# Patient Record
Sex: Female | Born: 1960 | Race: Black or African American | Hispanic: No | Marital: Married | Smoking: Never smoker
Health system: Southern US, Community
[De-identification: ages and names within clinical notes are randomized; demographics above are authoritative.]

## PROBLEM LIST (undated history)

## (undated) ENCOUNTER — Encounter

## (undated) DIAGNOSIS — M5136 Other intervertebral disc degeneration, lumbar region: Secondary | ICD-10-CM

## (undated) DIAGNOSIS — K222 Esophageal obstruction: Secondary | ICD-10-CM

## (undated) DIAGNOSIS — K5900 Constipation, unspecified: Secondary | ICD-10-CM

## (undated) DIAGNOSIS — K219 Gastro-esophageal reflux disease without esophagitis: Secondary | ICD-10-CM

## (undated) DIAGNOSIS — M797 Fibromyalgia: Secondary | ICD-10-CM

## (undated) DIAGNOSIS — I10 Essential (primary) hypertension: Secondary | ICD-10-CM

## (undated) DIAGNOSIS — E039 Hypothyroidism, unspecified: Secondary | ICD-10-CM

## (undated) DIAGNOSIS — J45909 Unspecified asthma, uncomplicated: Secondary | ICD-10-CM

## (undated) DIAGNOSIS — R569 Unspecified convulsions: Secondary | ICD-10-CM

## (undated) DIAGNOSIS — E119 Type 2 diabetes mellitus without complications: Secondary | ICD-10-CM

## (undated) DIAGNOSIS — F419 Anxiety disorder, unspecified: Secondary | ICD-10-CM

## (undated) DIAGNOSIS — D329 Benign neoplasm of meninges, unspecified: Secondary | ICD-10-CM

## (undated) DIAGNOSIS — F32A Depression, unspecified: Secondary | ICD-10-CM

## (undated) DIAGNOSIS — G43909 Migraine, unspecified, not intractable, without status migrainosus: Secondary | ICD-10-CM

## (undated) DIAGNOSIS — K589 Irritable bowel syndrome without diarrhea: Secondary | ICD-10-CM

## (undated) DIAGNOSIS — D6852 Prothrombin gene mutation: Secondary | ICD-10-CM

## (undated) HISTORY — DX: Fibromyalgia: M79.7

## (undated) HISTORY — DX: Gastro-esophageal reflux disease without esophagitis: K21.9

## (undated) HISTORY — DX: Hypothyroidism, unspecified: E03.9

## (undated) HISTORY — DX: Essential (primary) hypertension: I10

## (undated) HISTORY — DX: Other intervertebral disc degeneration, lumbar region: M51.36

## (undated) HISTORY — DX: Unspecified convulsions: R56.9

## (undated) HISTORY — DX: Unspecified asthma, uncomplicated: J45.909

## (undated) HISTORY — DX: Type 2 diabetes mellitus without complications: E11.9

## (undated) HISTORY — DX: Constipation, unspecified: K59.00

## (undated) HISTORY — DX: Esophageal obstruction: K22.2

## (undated) HISTORY — PX: COLECTOMY: SHX59

## (undated) HISTORY — PX: ROTATOR CUFF REPAIR: SHX139

## (undated) HISTORY — PX: CRANIOTOMY: SHX93

## (undated) HISTORY — PX: TONSILLECTOMY: SUR1361

## (undated) HISTORY — PX: KNEE SURGERY: SHX244

---

## 2016-09-03 ENCOUNTER — Emergency Department: Admit: 2016-09-03 | Discharge: 2016-09-03

## 2016-09-03 ENCOUNTER — Inpatient Hospital Stay: Admit: 2016-09-03 | Discharge: 2016-09-03

## 2016-09-03 DIAGNOSIS — S8392XA Sprain of unspecified site of left knee, initial encounter: Secondary | ICD-10-CM

## 2016-09-03 DIAGNOSIS — Z7951 Long term (current) use of inhaled steroids: Secondary | ICD-10-CM

## 2016-09-03 DIAGNOSIS — E7800 Pure hypercholesterolemia, unspecified: Secondary | ICD-10-CM

## 2016-09-03 DIAGNOSIS — F329 Major depressive disorder, single episode, unspecified: Secondary | ICD-10-CM

## 2016-09-03 DIAGNOSIS — M7052 Other bursitis of knee, left knee: Principal | ICD-10-CM

## 2016-09-03 DIAGNOSIS — F419 Anxiety disorder, unspecified: Secondary | ICD-10-CM

## 2016-09-03 DIAGNOSIS — M069 Rheumatoid arthritis, unspecified: Secondary | ICD-10-CM

## 2016-09-03 DIAGNOSIS — Z794 Long term (current) use of insulin: Secondary | ICD-10-CM

## 2016-09-03 DIAGNOSIS — M797 Fibromyalgia: Secondary | ICD-10-CM

## 2016-09-03 DIAGNOSIS — M25562 Pain in left knee: Secondary | ICD-10-CM

## 2016-09-03 DIAGNOSIS — Z7982 Long term (current) use of aspirin: Secondary | ICD-10-CM

## 2016-09-03 DIAGNOSIS — E039 Hypothyroidism, unspecified: Secondary | ICD-10-CM

## 2016-09-03 DIAGNOSIS — M79606 Pain in leg, unspecified: Secondary | ICD-10-CM

## 2016-09-03 DIAGNOSIS — J45909 Unspecified asthma, uncomplicated: Secondary | ICD-10-CM

## 2016-09-03 DIAGNOSIS — K219 Gastro-esophageal reflux disease without esophagitis: Secondary | ICD-10-CM

## 2016-09-03 DIAGNOSIS — E119 Type 2 diabetes mellitus without complications: Secondary | ICD-10-CM

## 2016-09-03 DIAGNOSIS — R569 Unspecified convulsions: Secondary | ICD-10-CM

## 2016-09-03 DIAGNOSIS — E785 Hyperlipidemia, unspecified: Secondary | ICD-10-CM

## 2016-09-03 DIAGNOSIS — I10 Essential (primary) hypertension: Secondary | ICD-10-CM

## 2016-09-03 DIAGNOSIS — Z79899 Other long term (current) drug therapy: Secondary | ICD-10-CM

## 2016-09-03 DIAGNOSIS — S86912A Strain of unspecified muscle(s) and tendon(s) at lower leg level, left leg, initial encounter: Secondary | ICD-10-CM

## 2016-09-03 DIAGNOSIS — G47419 Narcolepsy without cataplexy: Secondary | ICD-10-CM

## 2016-09-03 MED ORDER — IBUPROFEN 600 MG PO TABS
600 mg | Freq: Four times a day (QID) | ORAL | 0 refills | Status: CP | PRN
Start: 2016-09-03 — End: ?

## 2016-09-03 MED ORDER — IBUPROFEN 400 MG PO TABS
800 mg | Freq: Once | ORAL | Status: CP
Start: 2016-09-03 — End: ?

## 2016-09-03 NOTE — ED Notes
Report given to Lauren B. RN

## 2016-09-03 NOTE — ED Notes
IV removed with catheter intact.  Pressure applied, Time of discharge: 1037  AM., Patient discharged to  Home.  Patient discharged  ambulatory. to exit with belongings in  Stable condition.  Patient escorted by  family., Written discharge instructions given to  patient.  Patient/recipient  verbalizes discharge instructions. Pt refused to take perscription for ibuprofen.

## 2016-09-03 NOTE — ED Provider Notes
History     Chief Complaint   Patient presents with   ? Leg Pain     Left       HPI Comments: Rhonda Durham is a 56 y.o. female presenting to the ED today with c/o left knee pain. Pain is predominently to the medial aspect of the left knee and radiates into the lower leg. Denies any injury or trauma. History of surgery to the meniscus and ACL repair of the left knee. No other symptoms or complaints.     Patient is a 56 y.o. female presenting with Knee Pain. The history is provided by the patient. No language interpreter was used.   Knee Pain   Location:  Knee  Injury: no    Knee location:  L knee  Pain details:     Radiates to:  L leg    Severity:  Moderate    Onset quality:  Gradual    Timing:  Constant    Progression:  Unchanged  Chronicity:  New  Associated symptoms: no back pain, no fever, no neck pain, no numbness and no tingling        Allergies   Allergen Reactions   ? Ambien [Zolpidem] Other (See Comments)     Hallucinations     ? Cubicin [Daptomycin] Hives   ? Levaquin [Levofloxacin] Hives   ? Morphine Hives   ? Sulfa Drugs Hives   ? Tyloxapol Angioedema   ? Vancomycin Hives   ? Vicodin [Hydrocodone-Acetaminophen] Angioedema       Patient's Medications   New Prescriptions    No medications on file   Previous Medications    ALBUTEROL (PROVENTIL) 2 MG TABLET    Take 90 mg by mouth 2 times daily.    ALPRAZOLAM (XANAX) 2 MG TABLET    Take 2 mg by mouth 3 times daily as needed.     ARMODAFINIL (NUVIGIL) 250 MG TABS    Take by mouth daily.     ASPIRIN 81 MG TABLET DELAYED RELEASE    Take 81 mg by mouth daily.    AZELASTINE (ASTELIN) 0.1 % NASAL SPRAY    Spray 1 spray in each nostril 2 times daily. Use in each nostril as directed    CITALOPRAM HYDROBROMIDE (CELEXA PO)    Take 20 mg by mouth daily.     CLINDAMYCIN-BENZOYL PEROXIDE (BENZACLIN) 1-5 % GEL    Apply topically 2 times daily. Apply topically 2 times daily    CLONAZEPAM (KLONOPIN) 1 MG TABLET    Take 0.5 mg by mouth 2 times daily as needed.

## 2016-09-03 NOTE — ED Provider Notes
phasic flow. Femoral vein: No thrombus is identified in the visualized portions of the femoral vein which demonstrates normal compressibility and phasic flow. Popliteal vein: No thrombus is identified in the visualized portions of the popliteal vein which demonstrates normal compressibility and phasic flow. Superficial venous system: The visualized portions of the greater saphenous vein is patent without thrombus.     Impression: No evidence of deep venous thrombosis of left lower extremity or right common femoral vein.     Per Radiology: Xr Knee Left 1 Or 2 Views    Result Date: 09/03/2016  XR KNEE LEFT 1 OR 2 VIEWS Clinical indication:55 years Female Knee pain Comparison:  None Findings: The osseous structures are intact. No dislocations are visualized. There is joint space narrowing visualized the medial, lateral and patellofemoral compartment with osteophytosis. No soft tissue calcifications or radiopaque foreign bodies.     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 I personally reviewed the images and the residents findings and agree with the above. Read By - Nigel SloopSmita Sharma  Electronically Verified By - Nigel SloopSmita Sharma  Released Date Time - 09/03/2016 7:52 AM  Resident - Micheline RoughMarsela Hyska    EKG (Read by ED Provider):  not applicable    MDM  Number of Diagnoses or Management Options     Amount and/or Complexity of Data Reviewed  Clinical lab tests: ordered and reviewed  Tests in the radiology section of CPT?: ordered and reviewed  Discussion of test results with the performing providers: no  Decide to obtain previous medical records or to obtain history from someone other than the patient: no  Obtain history from someone other than the patient: no  Review and summarize past medical records: no  Discuss the patient with other providers: no  Independent visualization of images, tracings, or specimens: yes        ED Course & Re-Evaluation

## 2016-09-03 NOTE — ED Provider Notes
MONTELUKAST (SINGULAIR) 10 MG TABLET    Take 10 mg by mouth nightly at bedtime.    NALTREXONE-BUPROPION ER (CONTRAVE) 8-90 MG TABLET EXTENDED RELEASE 12 HOUR    Take 1 Tablet by mouth 2 times daily.    NEBIVOLOL (BYSTOLIC) 2.5 MG TABLET    Take 1 Tablet by mouth daily.    OMEPRAZOLE (PRILOSEC) 40 MG CAPSULE DELAYED RELEASE    Take 40 mg by mouth daily.    ONDANSETRON (ZOFRAN) 2 MG TABS    Take 8 mg by mouth as needed.     OXYCODONE (ROXICODONE) 30 MG IMMEDIATE RELEASE TABLET    Take 30 mg by mouth every 8 hours as needed.     POLYETHYLENE GLYCOL (MIRALAX) PACKET    Take by mouth 3 times daily.     PREGABALIN (LYRICA) 150 MG CAPSULE    Take  by mouth 2 times daily.    PRENATAL MV-MIN-FE FUM-FA-DHA (PRENATAL 1 PO)    Take by mouth daily.    PROCHLORPERAZINE (COMPAZINE) 10 MG TABLET    Take 10 mg by mouth every 8 hours as needed.    QUETIAPINE (SEROQUEL) 300 MG TABLET    Take by mouth daily.    ROSUVASTATIN (CRESTOR) 40 MG TABLET    Take 40 mg by mouth nightly at bedtime.     TIZANIDINE (ZANAFLEX) 4 MG TABLET    Take  by mouth every 6 hours as needed.    TRAMADOL (ULTRAM) 50 MG TABLET    Take 50 mg by mouth every 4 hours as needed for pain.    TRAZODONE (DESYREL) 150 MG TABLET    Take  by mouth nightly.    VALSARTAN (DIOVAN) 320 MG TABLET    Take  by mouth daily.    ZONISAMIDE (ZONEGRAN PO)    Take 250 mg by mouth 2 times daily.   Modified Medications    No medications on file   Discontinued Medications    No medications on file       Past Medical History:   Diagnosis Date   ? Anxiety    ? Asthma    ? Depression    ? Diabetes mellitus    ? Fibromyalgia    ? GERD (gastroesophageal reflux disease)    ? High cholesterol    ? Hyperlipemia    ? Hypertension    ? Hypothyroid    ? Narcolepsy    ? Rheumatoid arthritis    ? Seizures        Past Surgical History:   Procedure Laterality Date   ? APPENDECTOMY     ? BRAIN SURGERY  2006    craniotomy for menangioma resection   ? HYSTERECTOMY     ? KNEE ARTHROSCOPY Bilateral

## 2016-09-03 NOTE — ED Provider Notes
and phasic flow. Superficial venous system: The visualized portions of the greater saphenous vein is patent without thrombus.     Impression: No evidence of deep venous thrombosis of left lower extremity or right common femoral vein.     Per Radiology: Xr Knee Left 1 Or 2 Views    Result Date: 09/03/2016  XR KNEE LEFT 1 OR 2 VIEWS Clinical indication:55 years Female Knee pain Comparison:  None Findings: The osseous structures are intact. No dislocations are visualized. There is joint space narrowing visualized the medial, lateral and patellofemoral compartment with osteophytosis. No soft tissue calcifications or radiopaque foreign bodies.     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 I personally reviewed the images and the residents findings and agree with the above. Read By Nigel Sloop- Smita Sharma  Electronically Verified By - Nigel SloopSmita Sharma  Released Date Time - 09/03/2016 7:52 AM  Resident - Micheline RoughMarsela Hyska    EKG (Read by ED Provider):  not applicable    MDM  Number of Diagnoses or Management Options     Amount and/or Complexity of Data Reviewed  Clinical lab tests: ordered and reviewed  Tests in the radiology section of CPT?: ordered and reviewed  Discussion of test results with the performing providers: no  Decide to obtain previous medical records or to obtain history from someone other than the patient: no  Obtain history from someone other than the patient: no  Review and summarize past medical records: no  Discuss the patient with other providers: no  Independent visualization of images, tracings, or specimens: yes        ED Course & Re-Evaluation     Patient is a 56 y.o. female presenting to the ED today with c/o atraumatic left knee pain. History of meniscus surgery and ACL repair. Plan is for pain control, D-dimer, and XR of the knee. Will re-assess.  Claudie Fishermandom, Geoffrey Lyle, MD 7:11 AM 09/03/2016    9:10 AM  D-dimer 0.78. Will obtain US of the leg to r/o DVT.    10:10 AM

## 2016-09-03 NOTE — ED Provider Notes
US negative for DVT. XR suggestive of degenerative disease of the knee. Will discharge home with     ED Course       ED Disposition   ED Disposition: No ED Disposition Set      ED Clinical Impression   ED Clinical Impression:   No Clinical Impression Set      ED Patient Status   Patient Status:   {SH ED JX PATIENT STATUS:[phone]}        ED Medical Evaluation Initiated   Medical Evaluation Initiated:   Yes, filed at 09/03/16 0703  by Claudie Fishermandom, Geoffrey Lyle, MD             Claudie Fishermandom, Geoffrey Lyle, MD  09/03/16 [phone]      Scribe Attestation:  Donnella ShamI, Derek Koon, have acted as scribe for Claudie Fishermandom, Geoffrey Lyle, MD 7:11 AM 09/03/2016    Physician Attestation: Marland Kitchen***

## 2016-09-03 NOTE — ED Provider Notes
History reviewed. No pertinent family history.    Social History     Social History   ? Marital status: Married     Spouse name: N/A   ? Number of children: N/A   ? Years of education: N/A     Social History Main Topics   ? Smoking status: Never Smoker   ? Smokeless tobacco: Never Used   ? Alcohol use No   ? Drug use: No   ? Sexual activity: Not Asked     Other Topics Concern   ? None     Social History Narrative       Review of Systems   Constitutional: Negative for fever and chills.   HENT: Negative for sore throat and neck pain.    Eyes: Negative for discharge and redness.   Respiratory: Negative for cough and shortness of breath.    Cardiovascular: Negative for chest pain and leg swelling.   Gastrointestinal: Negative for nausea, vomiting, abdominal pain and diarrhea.   Genitourinary: Negative for dysuria and difficulty urinating.   Musculoskeletal: Positive for arthralgias. Negative for back pain.   Skin: Negative for rash.   Neurological: Negative for dizziness and headaches.   All other systems reviewed and are negative.      Physical Exam       ED Triage Vitals   BP 09/03/16 0636 138/88   Pulse 09/03/16 0636 78   Resp --    Temp 09/03/16 0636 36.8 ?C (98.3 ?F)   Temp src 09/03/16 0636 Oral   Height 09/03/16 0636 1.6 m   Weight 09/03/16 0636 74.4 kg   SpO2 09/03/16 0636 100 %   BMI (Calculated) 09/03/16 0636 29.11             Physical Exam   Constitutional: She is oriented to person, place, and time. She appears well-developed and well-nourished. No distress.   HENT:   Head: Normocephalic and atraumatic.   Nose: Nose normal.   Eyes: Conjunctivae are normal. Pupils are equal, round, and reactive to light. Right eye exhibits no discharge. Left eye exhibits no discharge.   Neck: Neck supple. No tracheal deviation present.   Pulmonary/Chest: Effort normal. No respiratory distress.   Musculoskeletal: Normal range of motion.   Left Lower Extremity: No obvious calf swelling. Some TTP of the left knee

## 2016-09-03 NOTE — ED Provider Notes
CONDITION:[phone]}  409.901 FS  641.19 FS  627.732 (16) FS    ED Workup   Procedures    Labs:  - - No data to display      Imaging (Read by ED Provider):  {Imaging findings:[phone]}    EKG (Read by ED Provider):  {EKG findings:[phone]}    MDM    ED Course & Re-Evaluation     ED Course         ED Disposition   ED Disposition: No ED Disposition Set      ED Clinical Impression   ED Clinical Impression:   No Clinical Impression Set      ED Patient Status   [facility] Status:   [phone][facility]        ED Medical Evaluation Initiated   Medical Evaluation Initiated:   Yes, filed at 09/03/16 0703  by Claudie Fishermandom, Geoffrey Lyle, MD             Claudie Fishermandom, Geoffrey Lyle, MD  09/03/16 [phone]

## 2016-09-03 NOTE — ED Provider Notes
Left Lower Extremity: No obvious calf swelling. Some TTP of the left knee at the left medial prepatellar surface. No erythema, no skin breakdown. Mild pain with ROM of the knee. Good distal perfusion.    Neurological: She is alert and oriented to person, place, and time.   Skin: Skin is warm and dry. She is not diaphoretic. No erythema.   Psychiatric: She has a normal mood and affect. Her behavior is normal.   Nursing note and vitals reviewed.      Differential DDx: arthritis, bursitis, less likely occult fracture or DVT    Is this an Emergent Medical Condition? Yes - Severe Pain/Acute Onset of Symptons  409.901 FS  641.19 FS  627.732 (16) FS    ED Workup   Procedures    Labs:  -   D-DIMER,QUANTITATIVE - Abnormal        Result Value Ref Range    D Dimer (hs) 0.78 (*) 0.00 - 0.49 ug/mL (FEU)    Comment: A cut-off for the exclusion of DVT and PE has not been established for this method         Imaging (Read by ED Provider):  Per Radiology: [place] Venous Doppler Lower Ext Left    Result Date: 09/03/2016  US VENOUS DOPPLER LOWER EXT LEFT History: 55 years Female Leg pain Technique: Real time ultrasound is used to examine the deep venous system of the left lower extremity. Gray scale with compression maneuvers, Color Doppler and Spectral Doppler at rest and with augmentation of the left common femoral, profunda femoral, femoral and popliteal veins  and right common femoral vein was performed. Images were obtained and stored in a permanent archive. Comparison: None Findings: Right lower extremity: Deep venous system: Common femoral vein: No thrombus is identified in the visualized portions of the common femoral  vein which demonstrates normal compressibility and phasic flow. Left lower extremity: Deep venous system: Common femoral vein: No thrombus is identified in the visualized portions of the common femoral  vein which demonstrates normal compressibility and

## 2016-09-03 NOTE — ED Provider Notes
Yes, filed at 09/03/16 0703  by Claudie Fishermandom, Geoffrey Lyle, MD             Claudie Fishermandom, Geoffrey Lyle, MD  09/03/16 [phone]      Scribe Attestation:  Donnella ShamI, Derek Koon, have acted as scribe for Claudie Fishermandom, Geoffrey Lyle, MD 7:11 AM 09/03/2016    Physician Attestation: Marland Kitchen***

## 2016-09-03 NOTE — ED Provider Notes
DEXLANSOPRAZOLE (DEXILANT) 60 MG CAPSULE DELAYED RELEASE    Take by mouth daily.    DICLOFENAC SODIUM (VOLTAREN) 1 % GEL    Apply 4 g topically 4 times daily.    EPINEPHRINE (EPIPEN 2-PAK, AUVI-Q, ADRENACLICK) 0.3 PZ/0.2HE SOLUTION AUTO-INJECTOR    Inject 0.3 mg into the muscle.    ERGOCALCIFEROL (VITAMIN D-2) 50000 UNITS CAPSULE    Take by mouth once a week. 2 times a week    EZETIMIBE (ZETIA) 10 MG TABLET    Take 10 mg by mouth nightly at bedtime.     FERROUS SULFATE 325 (65 FE) MG TABLET    Take 325 mg by mouth 2 times daily (with meals). Administer ferrous sulfate 2 hours prior to, or 4 hours after antacids.    FISH OIL 1000 MG CAPSULE    Take 1,000 mg by mouth daily.    FLUTICASONE-SALMETEROL (ADVAIR) 100-50 MCG/DOSE DISKUS INHALER    Inhale every 12 hours.    IBUPROFEN (ADVIL,MOTRIN) 800 MG TABLET    Take 800 mg by mouth every 8 hours as needed for pain.    INSULIN ASPART (NOVOLOG) INJECTION    Inject into the skin 3 times daily (before meals). Sliding scale      INSULIN GLARGINE (LANTUS) INJECTION    Inject 48 Units into the skin twice daily, three times a week.     IPRATROPIUM-ALBUTEROL (DUONEB) 0.5-2.5 (3) MG/3ML SOLN    Take  by nebulization.    LACOSAMIDE (VIMPAT) 50 MG TABLET TABLET    Take 150 mg by mouth 2 times daily.     LEVOCETIRIZINE (XYZAL) 5 MG TABS    Take 5 mg by mouth every evening.    LEVOTHYROXINE (SYNTHROID, LEVOTHROID) 50 MCG TABLET    Take 0.5 mcg by mouth daily.     LIDOCAINE (LIDODERM) 5 % PATCH    Place onto the skin every 24 hours. Each patch should be ON for 12 hours and OFF for 12 hours. Maximum of 3 patches in 24 hours.    LINACLOTIDE (LINZESS) 290 MCG CAPSULE    Take 290 mcg by mouth daily.    MAGNESIUM OXIDE (MAG-OX) 400 (241.3 MG) MG TABLET    Take 400 mg by mouth daily.    METHYLNALTREXONE BROMIDE (RELISTOR) 12 MG/0.6ML KIT    into the skin.    MONTELUKAST (SINGULAIR) 10 MG TABLET    Take 10 mg by mouth nightly at bedtime.

## 2016-09-03 NOTE — ED Provider Notes
Patient is a 56 y.o. female presenting to the ED today with c/o atraumatic left knee pain. History of meniscus surgery and ACL repair. Plan is for pain control, D-dimer, and XR of the knee. Will re-assess.  Claudie Fishermandom, Geoffrey Lyle, MD 7:11 AM 09/03/2016    9:10 AM  D-dimer 0.78. Will obtain US of the leg to r/o DVT.    10:17 AM  US negative for DVT. XR shows degenerative disease of the knee. Will discharge home with prescription for ibuprofen, follow up instructions given.    ED Course       ED Disposition   ED Disposition: Discharge      ED Clinical Impression   ED Clinical Impression:   Acute pain of left knee  Knee strain, left, initial encounter  Bursitis of left knee, unspecified bursa      ED Patient Status   Patient Status:   Good        ED Medical Evaluation Initiated   Medical Evaluation Initiated:   Yes, filed at 09/03/16 0703  by Claudie Fishermandom, Geoffrey Lyle, MD             Claudie Fishermandom, Geoffrey Lyle, MD  09/03/16 [phone]      Scribe Attestation:  Donnella ShamI, Derek Koon, have acted as scribe for Claudie Fishermandom, Geoffrey Lyle, MD 7:11 AM 09/03/2016    Physician Attestation: Marland Kitchen***

## 2016-09-03 NOTE — ED Provider Notes
ROSUVASTATIN (CRESTOR) 40 MG TABLET    Take 40 mg by mouth nightly at bedtime.     TIZANIDINE (ZANAFLEX) 4 MG TABLET    Take  by mouth every 6 hours as needed.    TRAMADOL (ULTRAM) 50 MG TABLET    Take 50 mg by mouth every 4 hours as needed for pain.    TRAZODONE (DESYREL) 150 MG TABLET    Take  by mouth nightly.    VALSARTAN (DIOVAN) 320 MG TABLET    Take  by mouth daily.    ZONISAMIDE (ZONEGRAN PO)    Take 250 mg by mouth 2 times daily.   Modified Medications    No medications on file   Discontinued Medications    No medications on file       Past Medical History:   Diagnosis Date   ? Anxiety    ? Asthma    ? Depression    ? Diabetes mellitus    ? Fibromyalgia    ? GERD (gastroesophageal reflux disease)    ? High cholesterol    ? Hyperlipemia    ? Hypertension    ? Hypothyroid    ? Narcolepsy    ? Rheumatoid arthritis    ? Seizures        Past Surgical History:   Procedure Laterality Date   ? APPENDECTOMY     ? BRAIN SURGERY  2006    craniotomy for menangioma resection   ? HYSTERECTOMY     ? KNEE ARTHROSCOPY Bilateral    ? TONSILLECTOMY     ? TUBAL LIGATION         History reviewed. No pertinent family history.    Social History     Social History   ? Marital status: Married     Spouse name: N/A   ? Number of children: N/A   ? Years of education: N/A     Social History Main Topics   ? Smoking status: Never Smoker   ? Smokeless tobacco: Never Used   ? Alcohol use No   ? Drug use: No   ? Sexual activity: Not Asked     Other Topics Concern   ? None     Social History Narrative       Review of Systems    Physical Exam       ED Triage Vitals   BP 09/03/16 0636 138/88   Pulse 09/03/16 0636 78   Resp --    Temp 09/03/16 0636 36.8 ?C (98.3 ?F)   Temp src 09/03/16 0636 Oral   Height 09/03/16 0636 1.6 m   Weight 09/03/16 0636 74.4 kg   SpO2 09/03/16 0636 100 %   BMI (Calculated) 09/03/16 0636 29.11             Physical Exam    Differential DDx: ***    Is this an Emergent Medical Condition? {SH ED EMERGENT MEDICAL

## 2016-09-03 NOTE — ED Provider Notes
left knee pain. History of meniscus surgery and ACL repair. Plan is for pain control, D-dimer, and XR of the knee. Will re-assess.  Claudie Fishermandom, Geoffrey Lyle, MD 7:11 AM 09/03/2016    ED Course         ED Disposition   ED Disposition: No ED Disposition Set      ED Clinical Impression   ED Clinical Impression:   No Clinical Impression Set      ED Patient Status   Patient Status:   {SH ED JX PATIENT [phone]}        ED Medical Evaluation Initiated   Medical Evaluation Initiated:   Yes, filed at 09/03/16 0703  by Claudie Fishermandom, Geoffrey Lyle, MD             Claudie Fishermandom, Geoffrey Lyle, MD  09/03/16 [phone]      Scribe Attestation:  Donnella ShamI, Derek Koon, have acted as scribe for Claudie Fishermandom, Geoffrey Lyle, MD 7:11 AM 09/03/2016    Physician Attestation: Marland Kitchen***

## 2016-09-03 NOTE — ED Provider Notes
at the left medial prepatellar surface. No erythema, no skin breakdown. Mild pain with ROM of the knee. Good distal perfusion.    Neurological: She is alert and oriented to person, place, and time.   Skin: Skin is warm and dry. She is not diaphoretic. No erythema.   Psychiatric: She has a normal mood and affect. Her behavior is normal.   Nursing note and vitals reviewed.      Differential DDx: arthritis, bursitis, less likely occult fracture or DVT    Is this an Emergent Medical Condition? Yes - Severe Pain/Acute Onset of Symptons  409.901 FS  641.19 FS  627.732 (16) FS    ED Workup   Procedures    Labs:  - - No data to display      Imaging (Read by ED Provider):  {Imaging findings:[phone]}    EKG (Read by ED Provider):  {EKG findings:([phone]}    MDM  Number of Diagnoses or Management Options     Amount and/or Complexity of Data Reviewed  Clinical lab tests: ordered and reviewed  Tests in the radiology section of CPT?: ordered and reviewed  Discussion of test results with the performing providers: no  Decide to obtain previous medical records or to obtain history from someone other than the patient: no  Obtain history from someone other than the patient: no  Review and summarize past medical records: no  Discuss the patient with other providers: no  Independent visualization of images, tracings, or specimens: yes        ED Course & Re-Evaluation     Patient is a 56 y.o. female presenting to the ED today with c/o atraumatic left knee pain. History of meniscus surgery and ACL repair. Plan is for pain control, D-dimer, and XR of the knee. Will re-assess.  Claudie Fishermandom, Geoffrey Lyle, MD 7:11 AM 09/03/2016    ED Course         ED Disposition   ED Disposition: No ED Disposition Set      ED Clinical Impression   ED Clinical Impression:   No Clinical Impression Set      ED Patient Status   Patient Status:   {SH ED JX PATIENT [phone]}        ED Medical Evaluation Initiated   Medical Evaluation Initiated:

## 2016-09-03 NOTE — ED Provider Notes
at the left medial prepatellar surface. No erythema, no skin breakdown. Mild pain with ROM of the knee. Good distal perfusion.    Neurological: She is alert and oriented to person, place, and time.   Skin: Skin is warm and dry. She is not diaphoretic. No erythema.   Psychiatric: She has a normal mood and affect. Her behavior is normal.   Nursing note and vitals reviewed.      Differential DDx: arthritis, bursitis, less likely occult fracture or DVT    Is this an Emergent Medical Condition? Yes - Severe Pain/Acute Onset of Symptons  409.901 FS  641.19 FS  627.732 (16) FS    ED Workup   Procedures    Labs:  - - No data to display      Imaging (Read by ED Provider):  Per Radiology: Xr Knee Left 1 Or 2 Views    Result Date: 09/03/2016  XR KNEE LEFT 1 OR 2 VIEWS Clinical indication:55 years Female Knee pain Comparison:  None Findings: The osseous structures are intact. No dislocations are visualized. There is joint space narrowing visualized the medial, lateral and patellofemoral compartment with osteophytosis. No soft tissue calcifications or radiopaque foreign bodies.     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     EKG (Read by ED Provider):  {EKG findings:[id number]}    MDM  Number of Diagnoses or Management Options     Amount and/or Complexity of Data Reviewed  Clinical lab tests: ordered and reviewed  Tests in the radiology section of CPT?: ordered and reviewed  Discussion of test results with the performing providers: no  Decide to obtain previous medical records or to obtain history from someone other than the patient: no  Obtain history from someone other than the patient: no  Review and summarize past medical records: no  Discuss the patient with other providers: no  Independent visualization of images, tracings, or specimens: yes        ED Course & Re-Evaluation     Patient is a 56 y.o. female presenting to the ED today with c/o atraumatic

## 2016-09-03 NOTE — ED Provider Notes
?   TONSILLECTOMY     ? TUBAL LIGATION         History reviewed. No pertinent family history.    Social History     Social History   ? Marital status: Married     Spouse name: N/A   ? Number of children: N/A   ? Years of education: N/A     Social History Main Topics   ? Smoking status: Never Smoker   ? Smokeless tobacco: Never Used   ? Alcohol use No   ? Drug use: No   ? Sexual activity: Not Asked     Other Topics Concern   ? None     Social History Narrative       Review of Systems   Constitutional: Negative for fever and chills.   HENT: Negative for sore throat and neck pain.    Eyes: Negative for discharge and redness.   Respiratory: Negative for cough and shortness of breath.    Cardiovascular: Negative for chest pain and leg swelling.   Gastrointestinal: Negative for nausea, vomiting, abdominal pain and diarrhea.   Genitourinary: Negative for dysuria and difficulty urinating.   Musculoskeletal: Positive for arthralgias. Negative for back pain.   Skin: Negative for rash.   Neurological: Negative for dizziness and headaches.   All other systems reviewed and are negative.      Physical Exam       ED Triage Vitals   BP 09/03/16 0636 138/88   Pulse 09/03/16 0636 78   Resp --    Temp 09/03/16 0636 36.8 ?C (98.3 ?F)   Temp src 09/03/16 0636 Oral   Height 09/03/16 0636 1.6 m   Weight 09/03/16 0636 74.4 kg   SpO2 09/03/16 0636 100 %   BMI (Calculated) 09/03/16 0636 29.11             Physical Exam   Constitutional: She is oriented to person, place, and time. She appears well-developed and well-nourished. No distress.   HENT:   Head: Normocephalic and atraumatic.   Nose: Nose normal.   Eyes: Conjunctivae are normal. Pupils are equal, round, and reactive to light. Right eye exhibits no discharge. Left eye exhibits no discharge.   Neck: Neck supple. No tracheal deviation present.   Pulmonary/Chest: Effort normal. No respiratory distress.   Musculoskeletal: Normal range of motion.

## 2016-09-03 NOTE — ED Notes
Unsuccessfull attempt

## 2016-09-03 NOTE — ED Provider Notes
History     Chief Complaint   Patient presents with   ? Leg Pain     Left       HPI Comments: Rhonda ShoreSandra Gusler is a 56 y.o. female presenting to the ED today with c/o left knee pain. Pain is predominently to the medial aspect of the left knee and radiates into the lower leg. Denies any injury or trauma. History of surgery to the meniscus and ACL repair of the left knee. No other symptoms or complaints.     Patient is a 56 y.o. female presenting with Knee Pain. The history is provided by the patient. No language interpreter was used.   Knee Pain   Location:  Knee  Injury: no    Knee location:  L knee  Pain details:     Radiates to:  L leg    Severity:  Moderate    Onset quality:  Gradual    Timing:  Constant    Progression:  Unchanged  Chronicity:  New  Associated symptoms: no back pain, no fever, no neck pain, no numbness and no tingling        Allergies   Allergen Reactions   ? Ambien [Zolpidem] Other (See Comments)     Hallucinations     ? Cubicin [Daptomycin] Hives   ? Levaquin [Levofloxacin] Hives   ? Morphine Hives   ? Sulfa Drugs Hives   ? Tyloxapol Angioedema   ? Vancomycin Hives   ? Vicodin [Hydrocodone-Acetaminophen] Angioedema       Patient's Medications   New Prescriptions    IBUPROFEN (ADVIL,MOTRIN) 600 MG PO TABLET    Take 1 tablet by mouth every 6 hours as needed for pain.   Previous Medications    ALBUTEROL (PROVENTIL) 2 MG TABLET    Take 90 mg by mouth 2 times daily.    ALPRAZOLAM (XANAX) 2 MG TABLET    Take 2 mg by mouth 3 times daily as needed.     ARMODAFINIL (NUVIGIL) 250 MG TABS    Take by mouth daily.     ASPIRIN 81 MG TABLET DELAYED RELEASE    Take 81 mg by mouth daily.    AZELASTINE (ASTELIN) 0.1 % NASAL SPRAY    Spray 1 spray in each nostril 2 times daily. Use in each nostril as directed    CITALOPRAM HYDROBROMIDE (CELEXA PO)    Take 20 mg by mouth daily.     CLINDAMYCIN-BENZOYL PEROXIDE (BENZACLIN) 1-5 % GEL    Apply topically 2 times daily. Apply topically 2 times daily

## 2016-09-03 NOTE — ED Provider Notes
left knee pain. History of meniscus surgery and ACL repair. Plan is for pain control, D-dimer, and XR of the knee. Will re-assess.  Claudie Fishermandom, Geoffrey Lyle, MD 7:11 AM 09/03/2016    9:10 AM  D-dimer 0.78. Will obtain US of the leg to r/o DVT.    ***  ED Course         ED Disposition   ED Disposition: No ED Disposition Set      ED Clinical Impression   ED Clinical Impression:   No Clinical Impression Set      ED Patient Status   Patient Status:   {SH ED JX PATIENT STATUS:[id number]}        ED Medical Evaluation Initiated   Medical Evaluation Initiated:   Yes, filed at 09/03/16 0703  by Claudie Fishermandom, Geoffrey Lyle, MD             Claudie Fishermandom, Geoffrey Lyle, MD  09/03/16 [phone]      Scribe Attestation:  Donnella ShamI, Derek Koon, have acted as scribe for Claudie Fishermandom, Geoffrey Lyle, MD 7:11 AM 09/03/2016    Physician Attestation: Marland Kitchen***

## 2016-09-03 NOTE — ED Provider Notes
Patient is a 56 y.o. female presenting to the ED today with c/o atraumatic left knee pain. History of meniscus surgery and ACL repair. Plan is for pain control, D-dimer, and XR of the knee. Will re-assess.  Claudie Fishermandom, Geoffrey Lyle, MD 7:11 AM 09/03/2016    9:10 AM  D-dimer 0.78. Will obtain US of the leg to r/o DVT.    10:17 AM  US negative for DVT. XR shows degenerative disease of the knee. Will discharge home with prescription for ibuprofen, follow up instructions given.    ED Course       ED Disposition   ED Disposition: Discharge      ED Clinical Impression   ED Clinical Impression:   Acute pain of left knee  Knee strain, left, initial encounter  Bursitis of left knee, unspecified bursa      ED Patient Status   Patient Status:   Good        ED Medical Evaluation Initiated   Medical Evaluation Initiated:   Yes, filed at 09/03/16 0703  by Claudie Fishermandom, Geoffrey Lyle, MD             Claudie Fishermandom, Geoffrey Lyle, MD  09/03/16 [phone]      Scribe Attestation:  Donnella ShamI, Derek Koon, have acted as scribe for Claudie Fishermandom, Geoffrey Lyle, MD 7:11 AM 09/03/2016    Physician Attestation: I have reviewed and confirmed the information stated by the scribe and made corrections and edits as appropriate.  I have personally provided the services documented by the scribe.      Janith LimaGeoffrey L Odom, MD         Claudie Fishermandom, Geoffrey Lyle, MD  09/03/16 [phone]

## 2016-09-03 NOTE — ED Provider Notes
IBUPROFEN (ADVIL,MOTRIN) 800 MG TABLET    Take 800 mg by mouth every 8 hours as needed for pain.    INSULIN ASPART (NOVOLOG) INJECTION    Inject into the skin 3 times daily (before meals). Sliding scale      INSULIN GLARGINE (LANTUS) INJECTION    Inject 48 Units into the skin twice daily, three times a week.     IPRATROPIUM-ALBUTEROL (DUONEB) 0.5-2.5 (3) MG/3ML SOLN    Take  by nebulization.    LACOSAMIDE (VIMPAT) 50 MG TABLET TABLET    Take 150 mg by mouth 2 times daily.     LEVOCETIRIZINE (XYZAL) 5 MG TABS    Take 5 mg by mouth every evening.    LEVOTHYROXINE (SYNTHROID, LEVOTHROID) 50 MCG TABLET    Take 0.5 mcg by mouth daily.     LIDOCAINE (LIDODERM) 5 % PATCH    Place onto the skin every 24 hours. Each patch should be ON for 12 hours and OFF for 12 hours. Maximum of 3 patches in 24 hours.    LINACLOTIDE (LINZESS) 290 MCG CAPSULE    Take 290 mcg by mouth daily.    MAGNESIUM OXIDE (MAG-OX) 400 (241.3 MG) MG TABLET    Take 400 mg by mouth daily.    METHYLNALTREXONE BROMIDE (RELISTOR) 12 MG/0.6ML KIT    into the skin.    MONTELUKAST (SINGULAIR) 10 MG TABLET    Take 10 mg by mouth nightly at bedtime.    NALTREXONE-BUPROPION ER (CONTRAVE) 8-90 MG TABLET EXTENDED RELEASE 12 HOUR    Take 1 Tablet by mouth 2 times daily.    NEBIVOLOL (BYSTOLIC) 2.5 MG TABLET    Take 1 Tablet by mouth daily.    OMEPRAZOLE (PRILOSEC) 40 MG CAPSULE DELAYED RELEASE    Take 40 mg by mouth daily.    ONDANSETRON (ZOFRAN) 2 MG TABS    Take 8 mg by mouth as needed.     OXYCODONE (ROXICODONE) 30 MG IMMEDIATE RELEASE TABLET    Take 30 mg by mouth every 8 hours as needed.     POLYETHYLENE GLYCOL (MIRALAX) PACKET    Take by mouth 3 times daily.     PREGABALIN (LYRICA) 150 MG CAPSULE    Take  by mouth 2 times daily.    PRENATAL MV-MIN-FE FUM-FA-DHA (PRENATAL 1 PO)    Take by mouth daily.    PROCHLORPERAZINE (COMPAZINE) 10 MG TABLET    Take 10 mg by mouth every 8 hours as needed.    QUETIAPINE (SEROQUEL) 300 MG TABLET    Take by mouth daily.

## 2016-09-03 NOTE — ED Provider Notes
at the left medial prepatellar surface. No erythema, no skin breakdown. Mild pain with ROM of the knee. Good distal perfusion.    Neurological: She is alert and oriented to person, place, and time.   Skin: Skin is warm and dry. She is not diaphoretic. No erythema.   Psychiatric: She has a normal mood and affect. Her behavior is normal.   Nursing note and vitals reviewed.      Differential DDx: arthritis, bursitis, less likely occult fracture or DVT    Is this an Emergent Medical Condition? Yes - Severe Pain/Acute Onset of Symptons  409.901 FS  641.19 FS  627.732 (16) FS    ED Workup   Procedures    Labs:  - - No data to display      Imaging (Read by ED Provider):  Per Radiology: [place] Venous Doppler Lower Ext Left    Result Date: 09/03/2016  US VENOUS DOPPLER LOWER EXT LEFT History: 55 years Female Leg pain Technique: Real time ultrasound is used to examine the deep venous system of the left lower extremity. Gray scale with compression maneuvers, Color Doppler and Spectral Doppler at rest and with augmentation of the left common femoral, profunda femoral, femoral and popliteal veins  and right common femoral vein was performed. Images were obtained and stored in a permanent archive. Comparison: None Findings: Right lower extremity: Deep venous system: Common femoral vein: No thrombus is identified in the visualized portions of the common femoral  vein which demonstrates normal compressibility and phasic flow. Left lower extremity: Deep venous system: Common femoral vein: No thrombus is identified in the visualized portions of the common femoral  vein which demonstrates normal compressibility and phasic flow. Femoral vein: No thrombus is identified in the visualized portions of the femoral vein which demonstrates normal compressibility and phasic flow. Popliteal vein: No thrombus is identified in the visualized portions of the popliteal vein which demonstrates normal compressibility

## 2016-09-03 NOTE — ED Provider Notes
History     Chief Complaint   Patient presents with   ? Leg Pain     Left       HPI    Allergies   Allergen Reactions   ? Ambien [Zolpidem] Other (See Comments)     Hallucinations     ? Cubicin [Daptomycin] Hives   ? Levaquin [Levofloxacin] Hives   ? Morphine Hives   ? Sulfa Drugs Hives   ? Tyloxapol Angioedema   ? Vancomycin Hives   ? Vicodin [Hydrocodone-Acetaminophen] Angioedema       Patient's Medications   New Prescriptions    No medications on file   Previous Medications    ALBUTEROL (PROVENTIL) 2 MG TABLET    Take 90 mg by mouth 2 times daily.    ALPRAZOLAM (XANAX) 2 MG TABLET    Take 2 mg by mouth 3 times daily as needed.     ARMODAFINIL (NUVIGIL) 250 MG TABS    Take by mouth daily.     ASPIRIN 81 MG TABLET DELAYED RELEASE    Take 81 mg by mouth daily.    AZELASTINE (ASTELIN) 0.1 % NASAL SPRAY    Spray 1 spray in each nostril 2 times daily. Use in each nostril as directed    CITALOPRAM HYDROBROMIDE (CELEXA PO)    Take 20 mg by mouth daily.     CLINDAMYCIN-BENZOYL PEROXIDE (BENZACLIN) 1-5 % GEL    Apply topically 2 times daily. Apply topically 2 times daily    CLONAZEPAM (KLONOPIN) 1 MG TABLET    Take 0.5 mg by mouth 2 times daily as needed.     DEXLANSOPRAZOLE (DEXILANT) 60 MG CAPSULE DELAYED RELEASE    Take by mouth daily.    DICLOFENAC SODIUM (VOLTAREN) 1 % GEL    Apply 4 g topically 4 times daily.    EPINEPHRINE (EPIPEN 2-PAK, AUVI-Q, ADRENACLICK) 0.3 MG/0.3ML SOLUTION AUTO-INJECTOR    Inject 0.3 mg into the muscle.    ERGOCALCIFEROL (VITAMIN D-2) 50000 UNITS CAPSULE    Take by mouth once a week. 2 times a week    EZETIMIBE (ZETIA) 10 MG TABLET    Take 10 mg by mouth nightly at bedtime.     FERROUS SULFATE 325 (65 FE) MG TABLET    Take 325 mg by mouth 2 times daily (with meals). Administer ferrous sulfate 2 hours prior to, or 4 hours after antacids.    FISH OIL 1000 MG CAPSULE    Take 1,000 mg by mouth daily.    FLUTICASONE-SALMETEROL (ADVAIR) 100-50 MCG/DOSE DISKUS INHALER    Inhale every 12 hours.

## 2016-09-03 NOTE — ED Triage Notes
Patient arrived to ED, with chief complaint of left leg pain. Patient states pain started 4 days aod with not trauma/injury. Patient has ortho history to left knee. PAtietn states pain starts above knee and wraps around to the calf area. Vitals stable, breathing unlabored. Call bell within reach.

## 2016-09-03 NOTE — ED Provider Notes
CLONAZEPAM (KLONOPIN) 1 MG TABLET    Take 0.5 mg by mouth 2 times daily as needed.     DEXLANSOPRAZOLE (DEXILANT) 60 MG CAPSULE DELAYED RELEASE    Take by mouth daily.    DICLOFENAC SODIUM (VOLTAREN) 1 % GEL    Apply 4 g topically 4 times daily.    EPINEPHRINE (EPIPEN 2-PAK, AUVI-Q, ADRENACLICK) 0.3 ZO/1.0RU SOLUTION AUTO-INJECTOR    Inject 0.3 mg into the muscle.    ERGOCALCIFEROL (VITAMIN D-2) 50000 UNITS CAPSULE    Take by mouth once a week. 2 times a week    EZETIMIBE (ZETIA) 10 MG TABLET    Take 10 mg by mouth nightly at bedtime.     FERROUS SULFATE 325 (65 FE) MG TABLET    Take 325 mg by mouth 2 times daily (with meals). Administer ferrous sulfate 2 hours prior to, or 4 hours after antacids.    FISH OIL 1000 MG CAPSULE    Take 1,000 mg by mouth daily.    FLUTICASONE-SALMETEROL (ADVAIR) 100-50 MCG/DOSE DISKUS INHALER    Inhale every 12 hours.    IBUPROFEN (ADVIL,MOTRIN) 800 MG TABLET    Take 800 mg by mouth every 8 hours as needed for pain.    INSULIN ASPART (NOVOLOG) INJECTION    Inject into the skin 3 times daily (before meals). Sliding scale      INSULIN GLARGINE (LANTUS) INJECTION    Inject 48 Units into the skin twice daily, three times a week.     IPRATROPIUM-ALBUTEROL (DUONEB) 0.5-2.5 (3) MG/3ML SOLN    Take  by nebulization.    LACOSAMIDE (VIMPAT) 50 MG TABLET TABLET    Take 150 mg by mouth 2 times daily.     LEVOCETIRIZINE (XYZAL) 5 MG TABS    Take 5 mg by mouth every evening.    LEVOTHYROXINE (SYNTHROID, LEVOTHROID) 50 MCG TABLET    Take 0.5 mcg by mouth daily.     LIDOCAINE (LIDODERM) 5 % PATCH    Place onto the skin every 24 hours. Each patch should be ON for 12 hours and OFF for 12 hours. Maximum of 3 patches in 24 hours.    LINACLOTIDE (LINZESS) 290 MCG CAPSULE    Take 290 mcg by mouth daily.    MAGNESIUM OXIDE (MAG-OX) 400 (241.3 MG) MG TABLET    Take 400 mg by mouth daily.    METHYLNALTREXONE BROMIDE (RELISTOR) 12 MG/0.6ML KIT    into the skin.

## 2016-09-03 NOTE — ED Provider Notes
NALTREXONE-BUPROPION ER (CONTRAVE) 8-90 MG TABLET EXTENDED RELEASE 12 HOUR    Take 1 Tablet by mouth 2 times daily.    NEBIVOLOL (BYSTOLIC) 2.5 MG TABLET    Take 1 Tablet by mouth daily.    OMEPRAZOLE (PRILOSEC) 40 MG CAPSULE DELAYED RELEASE    Take 40 mg by mouth daily.    ONDANSETRON (ZOFRAN) 2 MG TABS    Take 8 mg by mouth as needed.     OXYCODONE (ROXICODONE) 30 MG IMMEDIATE RELEASE TABLET    Take 30 mg by mouth every 8 hours as needed.     POLYETHYLENE GLYCOL (MIRALAX) PACKET    Take by mouth 3 times daily.     PREGABALIN (LYRICA) 150 MG CAPSULE    Take  by mouth 2 times daily.    PRENATAL MV-MIN-FE FUM-FA-DHA (PRENATAL 1 PO)    Take by mouth daily.    PROCHLORPERAZINE (COMPAZINE) 10 MG TABLET    Take 10 mg by mouth every 8 hours as needed.    QUETIAPINE (SEROQUEL) 300 MG TABLET    Take by mouth daily.    ROSUVASTATIN (CRESTOR) 40 MG TABLET    Take 40 mg by mouth nightly at bedtime.     TIZANIDINE (ZANAFLEX) 4 MG TABLET    Take  by mouth every 6 hours as needed.    TRAMADOL (ULTRAM) 50 MG TABLET    Take 50 mg by mouth every 4 hours as needed for pain.    TRAZODONE (DESYREL) 150 MG TABLET    Take  by mouth nightly.    VALSARTAN (DIOVAN) 320 MG TABLET    Take  by mouth daily.    ZONISAMIDE (ZONEGRAN PO)    Take 250 mg by mouth 2 times daily.   Modified Medications    No medications on file   Discontinued Medications    No medications on file       Past Medical History:   Diagnosis Date   ? Anxiety    ? Asthma    ? Depression    ? Diabetes mellitus    ? Fibromyalgia    ? GERD (gastroesophageal reflux disease)    ? High cholesterol    ? Hyperlipemia    ? Hypertension    ? Hypothyroid    ? Narcolepsy    ? Rheumatoid arthritis    ? Seizures        Past Surgical History:   Procedure Laterality Date   ? APPENDECTOMY     ? BRAIN SURGERY  2006    craniotomy for menangioma resection   ? HYSTERECTOMY     ? KNEE ARTHROSCOPY Bilateral    ? TONSILLECTOMY     ? TUBAL LIGATION

## 2016-09-26 ENCOUNTER — Emergency Department (HOSPITAL_COMMUNITY): Payer: Medicare Other

## 2016-09-26 ENCOUNTER — Encounter (HOSPITAL_COMMUNITY): Payer: Self-pay | Admitting: *Deleted

## 2016-09-26 ENCOUNTER — Emergency Department (HOSPITAL_COMMUNITY)
Admission: EM | Admit: 2016-09-26 | Discharge: 2016-09-26 | Disposition: A | Discharge status: Home / Self Care | Payer: Medicare Other | Attending: Emergency Medicine | Admitting: Emergency Medicine

## 2016-09-26 DIAGNOSIS — R251 Tremor, unspecified: Secondary | ICD-10-CM | POA: Insufficient documentation

## 2016-09-26 DIAGNOSIS — Z79899 Other long term (current) drug therapy: Secondary | ICD-10-CM | POA: Insufficient documentation

## 2016-09-26 DIAGNOSIS — R519 Headache, unspecified: Secondary | ICD-10-CM

## 2016-09-26 DIAGNOSIS — R51 Headache: Secondary | ICD-10-CM

## 2016-09-26 LAB — COMPREHENSIVE METABOLIC PANEL
ALBUMIN: 4.2 g/dL (ref 3.5–5.0)
ALT: 13 U/L — AB (ref 14–54)
AST: 22 U/L (ref 15–41)
Alkaline Phosphatase: 62 U/L (ref 38–126)
Anion gap: 9 (ref 5–15)
BUN: 10 mg/dL (ref 6–20)
CHLORIDE: 102 mmol/L (ref 101–111)
CO2: 26 mmol/L (ref 22–32)
CREATININE: 0.75 mg/dL (ref 0.44–1.00)
Calcium: 9.6 mg/dL (ref 8.9–10.3)
GFR calc non Af Amer: >60 mL/min (ref >60)
Glucose, Bld: 101 mg/dL — ABNORMAL HIGH (ref 65–99)
Potassium: 3.7 mmol/L (ref 3.5–5.1)
Sodium: 137 mmol/L (ref 135–145)
Total Bilirubin: 0.5 mg/dL (ref 0.3–1.2)
Total Protein: 7.3 g/dL (ref 6.5–8.1)

## 2016-09-26 LAB — TSH: TSH: 1.107 u[IU]/mL (ref 0.350–4.500)

## 2016-09-26 LAB — CBC WITH DIFFERENTIAL/PLATELET
BASOS PCT: 1 %
Basophils Absolute: 0 10*3/uL (ref 0.0–0.1)
EOS ABS: 0 10*3/uL (ref 0.0–0.7)
EOS PCT: 1 %
HCT: 38.6 % (ref 36.0–46.0)
Hemoglobin: 13.3 g/dL (ref 12.0–15.0)
Lymphocytes Relative: 36 %
Lymphs Abs: 1.9 10*3/uL (ref 0.7–4.0)
MCH: 31.3 pg (ref 26.0–34.0)
MCHC: 34.5 g/dL (ref 30.0–36.0)
MCV: 90.8 fL (ref 78.0–100.0)
Monocytes Absolute: 0.5 10*3/uL (ref 0.1–1.0)
Monocytes Relative: 9 %
Neutro Abs: 2.9 10*3/uL (ref 1.7–7.7)
Neutrophils Relative %: 53 %
PLATELETS: 307 10*3/uL (ref 150–400)
RBC: 4.25 MIL/uL (ref 3.87–5.11)
RDW: 13.5 % (ref 11.5–15.5)
WBC: 5.4 10*3/uL (ref 4.0–10.5)

## 2016-09-26 LAB — RAPID URINE DRUG SCREEN, HOSP PERFORMED
AMPHETAMINES: POSITIVE — AB
BENZODIAZEPINES: POSITIVE — AB
Barbiturates: NOT DETECTED
Cocaine: NOT DETECTED
Opiates: NOT DETECTED
Tetrahydrocannabinol: NOT DETECTED

## 2016-09-26 LAB — ETHANOL

## 2016-09-26 LAB — URINALYSIS, ROUTINE W REFLEX MICROSCOPIC
Bilirubin Urine: NEGATIVE
Glucose, UA: NEGATIVE mg/dL
HGB URINE DIPSTICK: NEGATIVE
Ketones, ur: NEGATIVE mg/dL
Leukocytes, UA: NEGATIVE
Nitrite: NEGATIVE
Protein, ur: NEGATIVE mg/dL
Specific Gravity, Urine: 1.011 (ref 1.005–1.030)
pH: 6 (ref 5.0–8.0)

## 2016-09-26 LAB — CBG MONITORING, ED: GLUCOSE-CAPILLARY: 94 mg/dL (ref 65–99)

## 2016-09-26 MED ORDER — BUTALBITAL-APAP-CAFFEINE 50-325-40 MG PO TABS: 1.0000 | ORAL_TABLET | Freq: Four times a day (QID) | ORAL | 0 refills | Status: AC | PRN

## 2016-09-26 MED ORDER — GADOBENATE DIMEGLUMINE 529 MG/ML IV SOLN
15.0000 mL | Freq: Once | INTRAVENOUS | Status: AC | PRN
  Administered 2016-09-26: 15 mL via INTRAVENOUS

## 2016-09-26 MED ORDER — BUTALBITAL-APAP-CAFFEINE 50-325-40 MG PO TABS
2.0000 | ORAL_TABLET | Freq: Once | ORAL | Status: AC
  Administered 2016-09-26: 2 via ORAL
  Filled 2016-09-26: qty 2

## 2016-09-26 NOTE — Discharge Instructions (Signed)
No abnormalities are found on your MRI today.  Consulting neurologist felt it would be appropriate for you to follow up with your neurologist upon returning home.  Fioricet as needed for headaches

## 2016-09-26 NOTE — Consult Note (Signed)
Reason for Consult: Headaches, tremors and memory issues. Referring Physician:  ED  Shelley Kim is an 56 y.o. female.  HPI: Patient with previous history of meningioma resected proximally 5-6 years ago. She reports at that time she used to have headaches which led to the diagnosis of meningioma. After resection she did develop some seizures reporting as petit mall and grand mal seizures. Last episode was approximately 5 years ago and she currently takes Vimpat 150 twice a day and Zonegran 250 twice a day. She also has had multiple TIAs which manifested themselves as speech disturbance. She currently takes aspirin 81 mg daily. She reports for the past 6 weeks she started to notice similar headache that she had 6 years ago. Headaches are localized in frontal region describing as throbbing sensation at a scale of 4-9/10. She reports some nausea with headache but no vomiting. No photophobia or phonophobia. No neck stiffness associated with a headache. She reports some changes in her vision with a headache. Describes changes as seeing blurry. Also has had blood pressure ranging from 140s to 180s at home. She is on 3 blood pressure medications. She also reports anxiety for which she takes as needed Xanax and also on multiple antidepressants. She had been seeing neurology in the past but has not seen in the recent months. She reports this tremor also appeared few weeks ago mostly affecting her right side. There is no exacerbating factors to the headache. She reports a headache gets worse she does take Fioricet and Motrin as needed. On average she had been taking approximately 2 Motrin and 3 Fioricets daily.  Past medical history TIAs Meningioma status post resection Diabetes Hypertension Anxiety disorder Seizure disorder  Past surgical history Meningioma resection 6 years ago  No family history on file.  Noncontributory  Social History: Does not smoke, drink alcohol, or abuse drugs. 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 Visiting from [place] and returning next weekend.  Allergies:  Allergies  Allergen Reactions  . Percocet [Oxycodone-Acetaminophen] Anaphylaxis    Tylox-capsules  . Ambien [Zolpidem] Other (See Comments)    hallucinations  . Cubicin [Daptomycin] Hives  . Levaquin [Levofloxacin] Hives  . Morphine And Related Hives  . Sulfa Antibiotics Hives  . Vancomycin Hives  . Adhesive [Tape] Rash   Medications: I have reviewed the patient's current medications.  Results for orders placed or performed during the hospital encounter of 09/26/16 (from the past 48 hour(s))  Ethanol     Status: None   Collection Time: 09/26/16  7:25 AM  Result Value Ref Range   Alcohol, Ethyl (B) <5 <5 mg/dL    Comment:        LOWEST DETECTABLE LIMIT FOR SERUM ALCOHOL IS 5 mg/dL FOR MEDICAL PURPOSES ONLY   Urinalysis, Routine w reflex microscopic     Status: Abnormal   Collection Time: 09/26/16  7:29 AM  Result Value Ref Range   Color, Urine YELLOW YELLOW   APPearance HAZY (A) CLEAR   Specific Gravity, Urine 1.011 1.005 - 1.030   pH 6.0 5.0 - 8.0   Glucose, UA NEGATIVE NEGATIVE mg/dL   Hgb urine dipstick NEGATIVE NEGATIVE   Bilirubin Urine NEGATIVE NEGATIVE   Ketones, ur NEGATIVE NEGATIVE mg/dL   Protein, ur NEGATIVE NEGATIVE mg/dL   Nitrite NEGATIVE NEGATIVE   Leukocytes, UA NEGATIVE NEGATIVE  Rapid urine drug screen (hospital performed)     Status: Abnormal   Collection Time: 09/26/16  7:29 AM  Result Value  Ref Range   Opiates NONE DETECTED NONE DETECTED   Cocaine NONE DETECTED NONE DETECTED   Benzodiazepines POSITIVE (A) NONE DETECTED   Amphetamines POSITIVE (A) NONE DETECTED   Tetrahydrocannabinol NONE DETECTED NONE DETECTED   Barbiturates NONE DETECTED NONE DETECTED    Comment:        DRUG SCREEN FOR MEDICAL PURPOSES ONLY.  IF CONFIRMATION IS NEEDED FOR ANY PURPOSE, NOTIFY LAB WITHIN 5 DAYS.        LOWEST DETECTABLE LIMITS FOR URINE DRUG  SCREEN Drug Class       Cutoff (ng/mL) Amphetamine      1000 Barbiturate      200 Benzodiazepine   628 Tricyclics       366 Opiates          300 Cocaine          300 THC              50   CBG monitoring, ED     Status: None   Collection Time: 09/26/16  7:42 AM  Result Value Ref Range   Glucose-Capillary 94 65 - 99 mg/dL   Comment 1 Notify RN    Comment 2 Document in Chart   CBC with Differential/Platelet     Status: None   Collection Time: 09/26/16  7:46 AM  Result Value Ref Range   WBC 5.4 4.0 - 10.5 K/uL   RBC 4.25 3.87 - 5.11 MIL/uL   Hemoglobin 13.3 12.0 - 15.0 g/dL   HCT 38.6 36.0 - 46.0 %   MCV 90.8 78.0 - 100.0 fL   MCH 31.3 26.0 - 34.0 pg   MCHC 34.5 30.0 - 36.0 g/dL   RDW 13.5 11.5 - 15.5 %   Platelets 307 150 - 400 K/uL   Neutrophils Relative % 53 %   Neutro Abs 2.9 1.7 - 7.7 K/uL   Lymphocytes Relative 36 %   Lymphs Abs 1.9 0.7 - 4.0 K/uL   Monocytes Relative 9 %   Monocytes Absolute 0.5 0.1 - 1.0 K/uL   Eosinophils Relative 1 %   Eosinophils Absolute 0.0 0.0 - 0.7 K/uL   Basophils Relative 1 %   Basophils Absolute 0.0 0.0 - 0.1 K/uL  Comprehensive metabolic panel     Status: Abnormal   Collection Time: 09/26/16  7:46 AM  Result Value Ref Range   Sodium 137 135 - 145 mmol/L   Potassium 3.7 3.5 - 5.1 mmol/L   Chloride 102 101 - 111 mmol/L   CO2 26 22 - 32 mmol/L   Glucose, Bld 101 (H) 65 - 99 mg/dL   BUN 10 6 - 20 mg/dL   Creatinine, Ser 0.75 0.44 - 1.00 mg/dL   Calcium 9.6 8.9 - 10.3 mg/dL   Total Protein 7.3 6.5 - 8.1 g/dL   Albumin 4.2 3.5 - 5.0 g/dL   AST 22 15 - 41 U/L   ALT 13 (L) 14 - 54 U/L   Alkaline Phosphatase 62 38 - 126 U/L   Total Bilirubin 0.5 0.3 - 1.2 mg/dL   GFR calc non Af Amer >60 >60 mL/min   GFR calc Af Amer >60 >60 mL/min    Comment: (NOTE) The eGFR has been calculated using the CKD EPI equation. This calculation has not been validated in all clinical situations. eGFR's persistently <60 mL/min signify possible Chronic  Kidney Disease.    Anion gap 9 5 - 15  TSH     Status: None   Collection Time: 09/26/16  7:46  AM  Result Value Ref Range   TSH 1.107 0.350 - 4.500 uIU/mL    Comment: Performed by a 3rd Generation assay with a functional sensitivity of <=0.01 uIU/mL.   Ct Head Wo Contrast  Result Date: 09/26/2016 CLINICAL DATA:  Right upper extremity weakness and tremors. History of meningioma resection in 2009. EXAM: CT HEAD WITHOUT CONTRAST TECHNIQUE: Contiguous axial images were obtained from the base of the skull through the vertex without intravenous contrast. COMPARISON:  None. FINDINGS: Brain: There is no evidence of acute cortical infarct, intracranial hemorrhage, mass, midline shift, or extra-axial fluid collection. The ventricles and sulci are normal. Vascular: No hyperdense vessel or unexpected calcification. Skull: Prior right frontal craniotomy. Sinuses/Orbits: No acute finding. Other: None. IMPRESSION: No evidence of acute intracranial abnormality. Electronically Signed   By: Logan Bores M.D.   On: 09/26/2016 08:07   Review of Systems  Constitutional: Negative.   HENT: Negative.   Eyes: Positive for blurred vision.  Respiratory: Negative.   Cardiovascular: Negative.   Gastrointestinal: Negative.   Genitourinary: Negative.   Musculoskeletal: Negative.   Skin: Negative.   Neurological: Negative.   Endo/Heme/Allergies: Negative.   Psychiatric/Behavioral: Positive for memory loss. The patient is nervous/anxious.    Blood pressure 143/83, pulse 79, temperature 97.8 F (36.6 C), temperature source Oral, resp. rate 18, height 5' 3"  (1.6 m), weight 70.3 kg (155 lb), SpO2 99 %. Physical Exam HEENT-  Normocephalic, no lesions, Neck supple and no rigidity was appreciated. Cardiovascular - regular rate and rhythm, S1, S2 normal, no murmur, click, rub or gallop Lungs - chest clear, no wheezing, rales, normal symmetric air entry, Heart exam - S1, S2 normal, no murmur, no gallop, rate regular Abdomen  - soft nontender and nondistended  Neurologic Examination: Mental status: Awake alert and oriented to all spheres. Speech and language: No evidence of dysarthria was appreciated. Comprehension intact. Naming intact. Fluent. Reading intact. Cranial nerves: Pupils approximately 3 mm and reactive to light. Fundus exam showed no evidence of papilledema bilaterally. Extra muscles intact. Facial sensation symmetric. No facial droop is appreciated. Hearing intact. Uvula midline. Tongue midline. Motor: 5/5 throughout Sensory: Normal sensation to light touch Coordination: Normal finger to nose and heel to show bilaterally no evidence of dysdiadochokinesia. Gait: Deferred  There is high-frequency low amplitude tremor appears mostly with action in bilateral upper and lower extremities right more than left. This tremor seems to disappear with distraction as well as at rest.  Assessment/Plan: 56 year old female with significant past medical history presented with recurrent headaches, tremor and memory issues. Most of the symptoms are chronic based on history. Unclear etiology of tremors at this time and clinical examination is somewhat concerning for possible functional disorder although this would be a diagnosis of exclusion. At this time there are no red flags in history with regards to headaches. However I have offered patient an MRI of the brain with and without contrast to look for any evidence of recurrent meningioma. If MRI is negative patient can follow-up with neurologist on an outpatient basis. I also believe her headache is related to overuse of analgesic medications. Therefore I have recommended her to cut down the use of Fioricet and ibuprofen. Tremors could also be worsened or unmasked by the use of Adderall.  Shelley Kim 09/26/2016, 11:25 AM

## 2016-09-26 NOTE — ED Notes (Signed)
Patient transported to MRI 

## 2016-09-26 NOTE — ED Notes (Signed)
Neurology at bedside.

## 2016-09-26 NOTE — ED Notes (Signed)
Pt is in peds with granddaughter

## 2016-09-26 NOTE — ED Provider Notes (Signed)
[facility] DEPT Provider Note   CSN: [id number] Arrival date & time: 09/26/16  [id number]     History   Chief Complaint Chief Complaint  Patient presents with  . Headache  . Tremors    HPI Shelley Kim is a 56 y.o. female. Chief complaint is headache and tremor  HPI:  56 year old female who lives in [place]. Visiting her granddaughter in [place]. Flew here. Did not drive.  She reports a headache for the last 3-4 days that is new for her. Has had headache in the past but not for many years. Also developed tremors of her bilateral upper extremities, right greater than left.  She reports past history of hypertension on 3 medications and type 2 diabetes now insulin-dependent. Also reports a craniotomy in 2009 for excision of a frontal fell seen meningioma. He was excised because she had become symptomatic with difficulties with memory and left hemiparesis.  She did well postoperatively and had decrease her headaches. Had ultimately good control of what were found to be seizures later. Has had grand mal, and petit mal seizures. Her last was 6 years ago.  Patient also reports some difficulty with memory over the last few days such as forgetting where she was going or what she was going to say.  Her last CT scan was 6 months ago in [place] and she reports it as "normal".  She states that her doctors in [place] over 6 months ago that she had had "2 TIAs".   She is on Zonegran, and Vimpat for seizures. Takes Xanax occasionally in when necessary for anxiety and had a dose yesterday. Denies alcohol and drugs.  Takes Fioricet rarely and when necessary for headaches. Has been taking it regularly for the last 2 days.  History reviewed. No pertinent past medical history.  There are no active problems to display for this patient.   No past surgical history on file.  OB History    No data available       Home Medications    Prior to Admission medications   Medication Sig Start Date End  Date Taking? Authorizing Provider  albuterol (PROVENTIL) (2.5 MG/3ML) 0.083% nebulizer solution Take 2.5 mg by nebulization every 6 (six) hours as needed for shortness of breath or wheezing. 09/20/16  Yes Historical Provider, MD  alprazolam Duanne Moron) 2 MG tablet Take 2 mg by mouth every 8 (eight) hours as needed for anxiety. 09/20/16  Yes Historical Provider, MD  amphetamine-dextroamphetamine (ADDERALL) 20 MG tablet Take 20 mg by mouth 2 (two) times daily. 09/20/16  Yes Historical Provider, MD  carvedilol (COREG) 6.25 MG tablet Take 6.25 mg by mouth 2 (two) times daily. 07/07/16  Yes Historical Provider, MD  citalopram (CELEXA) 40 MG tablet Take 40 mg by mouth daily. 07/07/16  Yes Historical Provider, MD  clobetasol ointment (TEMOVATE) [id number] % Apply 1 application topically daily as needed. 09/20/16  Yes Historical Provider, MD  cloNIDine (CATAPRES) 0.2 MG tablet Take 0.2 mg by mouth daily. 07/07/16  Yes Historical Provider, MD  cyanocobalamin (,VITAMIN B-12,) 1000 MCG/ML injection Inject 1 mL into the muscle every 14 (fourteen) days. 09/20/16  Yes Historical Provider, MD  DEXILANT 60 MG capsule Take 60 mg by mouth daily. 09/20/16  Yes Historical Provider, MD  diclofenac (VOLTAREN) 75 MG EC tablet Take 75 mg by mouth 2 (two) times daily as needed for pain. 09/20/16  Yes Historical Provider, MD  DIOVAN 320 MG tablet Take 320 mg by mouth daily. 09/20/16  Yes Historical Provider, MD  gabapentin (NEURONTIN) 100 MG capsule Take 100 mg by mouth 3 (three) times daily as needed for pain. 09/20/16  Yes Historical Provider, MD  HM CLEARLAX powder Take 17 g by mouth daily. 09/20/16  Yes Historical Provider, MD  hydrALAZINE (APRESOLINE) 50 MG tablet Take 50 mg by mouth every 8 (eight) hours. 09/20/16  Yes Historical Provider, MD  HYDROQUINONE TIME RELEASE 4 % CREA Apply 1 application topically daily. 09/20/16  Yes Historical Provider, MD  Lacosamide (VIMPAT) 150 MG TABS Take 150 mg by mouth 2 (two) times daily.   Yes Historical  Provider, MD  LANTUS SOLOSTAR 100 UNIT/ML Solostar Pen Inject 10-48 Units into the skin 2 (two) times daily. Inject 10 units in the morning, and 48 in the evening 08/11/16  Yes Historical Provider, MD  NOVOLOG FLEXPEN 100 UNIT/ML FlexPen Inject 6-10 Units into the skin 3 (three) times daily. Sliding scale 09/20/16  Yes Historical Provider, MD  ondansetron (ZOFRAN-ODT) 8 MG disintegrating tablet Take 8 mg by mouth every 8 (eight) hours as needed for nausea/vomiting. 08/16/16  Yes Historical Provider, MD  oxyCODONE (ROXICODONE) 15 MG immediate release tablet Take 15 mg by mouth every 6 (six) hours as needed for pain. 09/20/16  Yes Historical Provider, MD  PERI-COLACE 8.6-50 MG tablet Take 1 tablet by mouth daily. 07/07/16  Yes Historical Provider, MD  Prenatal Vit-Fe Fumarate-FA (PRENATAL VITAMIN PLUS LOW IRON) 27-1 MG TABS Take 1 tablet by mouth daily. 09/20/16  Yes Historical Provider, MD  PROAIR HFA 108 (90 Base) MCG/ACT inhaler Take 2 puffs by mouth every 6 (six) hours as needed for shortness of breath or wheezing. 08/11/16  Yes Historical Provider, MD  promethazine-codeine (PHENERGAN WITH CODEINE) 6.25-10 MG/5ML syrup Take 5 mLs by mouth every 6 (six) hours as needed for cough. 09/20/16  Yes Historical Provider, MD  REPATHA SURECLICK [id number] MG/ML SOAJ Inject 140 mg into the skin every 14 (fourteen) days. 09/20/16  Yes Historical Provider, MD  RETIN-A 0.05 % cream Apply 1 application topically daily. 09/20/16  Yes Historical Provider, MD  SEROQUEL 100 MG tablet Take 100 mg by mouth daily. 07/07/16  Yes Historical Provider, MD  SEROQUEL XR 400 MG 24 hr tablet Take 400 mg by mouth at bedtime. 07/07/16  Yes Historical Provider, MD  spironolactone (ALDACTONE) 50 MG tablet Take 50 mg by mouth 2 (two) times daily. 09/20/16  Yes Historical Provider, MD  terconazole (TERAZOL 3) 0.8 % vaginal cream Place 1 applicator vaginally every 7 (seven) days. 09/20/16  Yes Historical Provider, MD  traZODone (DESYREL) 100 MG  tablet Take 150 mg by mouth at bedtime as needed for sleep. 07/07/16  Yes Historical Provider, MD  triamcinolone (KENALOG) 0.025 % cream Apply 1 application topically daily. 09/20/16  Yes Historical Provider, MD  Vitamin D, Ergocalciferol, (DRISDOL) 50000 units CAPS capsule Take 50,000 Units by mouth 2 (two) times a week. 09/20/16  Yes Historical Provider, MD  zonisamide (ZONEGRAN) 100 MG capsule Take 200 mg by mouth 2 (two) times daily. Take with 50mg  capsule to equal 250mg    Yes Historical Provider, MD  zonisamide (ZONEGRAN) 50 MG capsule Take 50 mg by mouth daily. Take with 200mg  capsule to equal 250mg    Yes Historical Provider, MD  butalbital-acetaminophen-caffeine (FIORICET, ESGIC) [phone] MG tablet Take 1-2 tablets by mouth every 6 (six) hours as needed for headache. 09/26/16 09/26/17  Tanna Furry, MD    Family History No family history on file.  Social History Social History  Substance Use Topics  . Smoking status: Not on  file  . Smokeless tobacco: Not on file  . Alcohol use Not on file     Allergies   Percocet [oxycodone-acetaminophen]; Ambien [zolpidem]; Cubicin [daptomycin]; Levaquin [levofloxacin]; Morphine and related; Sulfa antibiotics; Vancomycin; and Adhesive [tape]   Review of Systems Review of Systems  Constitutional: Negative for appetite change, chills, diaphoresis, fatigue and fever.  HENT: Negative for mouth sores, sore throat and trouble swallowing.   Eyes: Negative for visual disturbance.  Respiratory: Negative for cough, chest tightness, shortness of breath and wheezing.   Cardiovascular: Negative for chest pain.  Gastrointestinal: Negative for abdominal distention, abdominal pain, diarrhea, nausea and vomiting.  Endocrine: Negative for polydipsia, polyphagia and polyuria.  Genitourinary: Negative for dysuria, frequency and hematuria.  Musculoskeletal: Negative for gait problem.  Skin: Negative for color change, pallor and rash.  Neurological: Positive for  tremors and headaches. Negative for dizziness, syncope and light-headedness.  Hematological: Does not bruise/bleed easily.  Psychiatric/Behavioral: Negative for behavioral problems and confusion.     Physical Exam Updated Vital Signs BP 128/93   Pulse 100   Temp 97.8 F (36.6 C) (Oral)   Resp 19   Ht 5\' 3"  (1.6 m)   Wt 155 lb (70.3 kg)   SpO2 100%   BMI 27.46 kg/m   Physical Exam  Constitutional: She is oriented to person, place, and time. She appears well-developed and well-nourished. No distress.  HENT:  Head: Normocephalic.  Eyes: Conjunctivae are normal. Pupils are equal, round, and reactive to light. No scleral icterus.  Neck: Normal range of motion. Neck supple. No thyromegaly present.  Cardiovascular: Normal rate and regular rhythm.  Exam reveals no gallop and no friction rub.   No murmur heard. Pulmonary/Chest: Effort normal and breath sounds normal. No respiratory distress. She has no wheezes. She has no rales.  Abdominal: Soft. Bowel sounds are normal. She exhibits no distension. There is no tenderness. There is no rebound.  Musculoskeletal: Normal range of motion.  Neurological: She is alert and oriented to person, place, and time.  Bilateral upper extremity tremors right greater than left. The tremors do seem to go away with intention.  Skin: Skin is warm and dry. No rash noted.  Psychiatric: She has a normal mood and affect. Her behavior is normal.     ED Treatments / Results  Labs (all labs ordered are listed, but only abnormal results are displayed) Labs Reviewed  COMPREHENSIVE METABOLIC PANEL - Abnormal; Notable for the following:       Result Value   Glucose, Bld 101 (*)    ALT 13 (*)    All other components within normal limits  URINALYSIS, ROUTINE W REFLEX MICROSCOPIC - Abnormal; Notable for the following:    APPearance HAZY (*)    All other components within normal limits  RAPID URINE DRUG SCREEN, HOSP PERFORMED - Abnormal; Notable for the  following:    Benzodiazepines POSITIVE (*)    Amphetamines POSITIVE (*)    All other components within normal limits  CBC WITH DIFFERENTIAL/PLATELET  TSH  ETHANOL  CBG MONITORING, ED    EKG  EKG Interpretation None       Radiology No results found.  Procedures Procedures (including critical care time)  Medications Ordered in ED Medications  butalbital-acetaminophen-caffeine (FIORICET, ESGIC) 50-325-40 MG per tablet 2 tablet (2 tablets Oral Given 09/26/16 0929)  gadobenate dimeglumine (MULTIHANCE) injection 15 mL (15 mLs Intravenous Contrast Given 09/26/16 1540)     Initial Impression / Assessment and Plan / ED Course  I have reviewed  the triage vital signs and the nursing notes.  Pertinent labs & imaging results that were available during my care of the patient were reviewed by me and considered in my medical decision making (see chart for details).     Asymmetric neurological exam with right upper cavity tremors greater than left. No pronator drift. Stable gait and negative Romberg. Symmetric cranial nerves with the exception of slight decrease right lower facial sensation.  Unlikely for a meningioma to recur especially in 6 months after "normal" CT scan to the size of causing symptoms. Her symptoms are similar to her original symptoms with her primary diagnosis. Different cyanosis includes tumor recurrence, TIA. Doubt which are drug or medication related.  Final Clinical Impressions(s) / ED Diagnoses   Final diagnoses:  Acute nonintractable headache, unspecified headache type    New Prescriptions Discharge Medication List as of 09/26/2016  3:29 PM       Tanna Furry, MD 09/30/16 2307

## 2016-09-26 NOTE — ED Notes (Signed)
Pt's CBG 94.  Informed Loletta Specter, Therapist, sports.

## 2016-09-26 NOTE — ED Triage Notes (Signed)
Pt having headaches and tremors, beginning approx 3 days ago. Hx of crainiotomy about 78yrs prior. Pt in town from [place]. Also reports swelling to hands for about same time.

## 2016-09-26 NOTE — ED Notes (Signed)
Patient transported to CT 

## 2018-07-21 ENCOUNTER — Inpatient Hospital Stay: Admit: 2018-07-21 | Discharge: 2018-07-21

## 2018-07-21 DIAGNOSIS — I10 Essential (primary) hypertension: Secondary | ICD-10-CM

## 2018-07-21 DIAGNOSIS — F419 Anxiety disorder, unspecified: Secondary | ICD-10-CM

## 2018-07-21 DIAGNOSIS — E7800 Pure hypercholesterolemia, unspecified: Secondary | ICD-10-CM

## 2018-07-21 DIAGNOSIS — M069 Rheumatoid arthritis, unspecified: Secondary | ICD-10-CM

## 2018-07-21 DIAGNOSIS — F329 Major depressive disorder, single episode, unspecified: Secondary | ICD-10-CM

## 2018-07-21 DIAGNOSIS — Z794 Long term (current) use of insulin: Secondary | ICD-10-CM

## 2018-07-21 DIAGNOSIS — Z7951 Long term (current) use of inhaled steroids: Secondary | ICD-10-CM

## 2018-07-21 DIAGNOSIS — R1084 Generalized abdominal pain: Principal | ICD-10-CM

## 2018-07-21 DIAGNOSIS — E119 Type 2 diabetes mellitus without complications: Secondary | ICD-10-CM

## 2018-07-21 DIAGNOSIS — K219 Gastro-esophageal reflux disease without esophagitis: Secondary | ICD-10-CM

## 2018-07-21 DIAGNOSIS — R197 Diarrhea, unspecified: Secondary | ICD-10-CM

## 2018-07-21 DIAGNOSIS — Z79899 Other long term (current) drug therapy: Secondary | ICD-10-CM

## 2018-07-21 DIAGNOSIS — J45909 Unspecified asthma, uncomplicated: Principal | ICD-10-CM

## 2018-07-21 DIAGNOSIS — E039 Hypothyroidism, unspecified: Secondary | ICD-10-CM

## 2018-07-21 DIAGNOSIS — Z7982 Long term (current) use of aspirin: Secondary | ICD-10-CM

## 2018-07-21 DIAGNOSIS — M797 Fibromyalgia: Secondary | ICD-10-CM

## 2018-07-21 DIAGNOSIS — R569 Unspecified convulsions: Secondary | ICD-10-CM

## 2018-07-21 DIAGNOSIS — G47419 Narcolepsy without cataplexy: Secondary | ICD-10-CM

## 2018-07-21 DIAGNOSIS — E785 Hyperlipidemia, unspecified: Secondary | ICD-10-CM

## 2018-07-21 DIAGNOSIS — K529 Noninfective gastroenteritis and colitis, unspecified: Secondary | ICD-10-CM

## 2018-07-21 DIAGNOSIS — R112 Nausea with vomiting, unspecified: Secondary | ICD-10-CM

## 2018-07-21 MED ORDER — ONDANSETRON HCL 4 MG/2ML IJ SOLN
4 mg | Freq: Once | INTRAVENOUS | Status: CP
Start: 2018-07-21 — End: ?

## 2018-07-21 MED ORDER — HYDRALAZINE HCL 10 MG PO TABS
10 mg | Freq: Every day | ORAL
Start: 2018-07-21 — End: ?

## 2018-07-21 MED ORDER — DICYCLOMINE HCL 10 MG PO CAPS
20 mg | Freq: Once | ORAL | Status: CP
Start: 2018-07-21 — End: ?

## 2018-07-21 MED ORDER — AMPHETAMINE-DEXTROAMPHETAMINE 20 MG PO TABS
20 mg | Freq: Two times a day (BID) | ORAL
Start: 2018-07-21 — End: ?

## 2018-07-21 MED ORDER — ALIROCUMAB 75 MG/ML SC SOSY
75 mg | SUBCUTANEOUS
Start: 2018-07-21 — End: ?

## 2018-07-21 MED ORDER — DICYCLOMINE HCL 20 MG PO TABS
20 mg | Freq: Three times a day (TID) | ORAL | 0 refills | Status: CP | PRN
Start: 2018-07-21 — End: ?

## 2018-07-21 MED ORDER — BOLUS IV FLUID JX
Freq: Once | INTRAVENOUS | Status: CP
Start: 2018-07-21 — End: ?

## 2018-07-21 MED ORDER — PROMETHAZINE HCL 25 MG RE SUPP
25 mg | Freq: Four times a day (QID) | RECTAL | 0 refills | Status: CP | PRN
Start: 2018-07-21 — End: ?

## 2018-07-21 MED ORDER — OXYCODONE HCL 15 MG PO TABS
15 mg | Freq: Four times a day (QID) | ORAL
Start: 2018-07-21 — End: ?

## 2018-07-21 MED ORDER — ONDANSETRON 4 MG PO TBDP
4 mg | Freq: Once | ORAL | 0 refills | Status: CP
Start: 2018-07-21 — End: ?

## 2018-07-21 MED ORDER — AMLODIPINE BESYLATE 10 MG PO TABS
10 mg | Freq: Every day | ORAL
Start: 2018-07-21 — End: ?

## 2018-07-22 IMAGING — CT CT HEAD W/O CM
3 of 4 series · 18 of 47 positions shown, 21 images · non-contrast
Comparison: None.

CLINICAL DATA: Right upper extremity weakness and tremors. History
of meningioma resection in 3552.

EXAM:
CT HEAD WITHOUT CONTRAST
TECHNIQUE: Contiguous axial images were obtained from the base of the skull
through the vertex without intravenous contrast.

[Series 201: head w/o, idose (1) · axial · non-contrast · 0.43mm/px · z∈[+127,+247]mm · 12 of 30 slices shown, 15 images]
[im 3/30  brain]
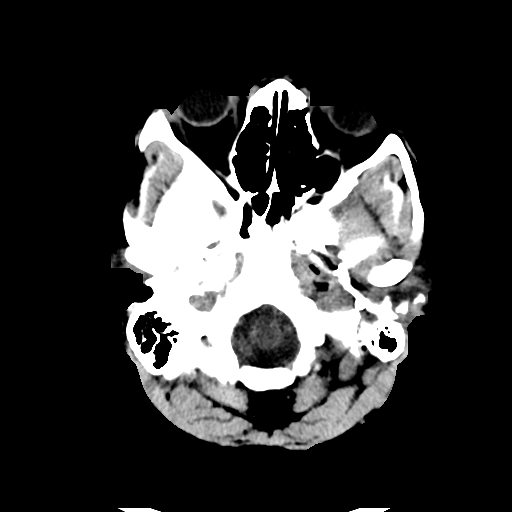
[im 3/30  bone]
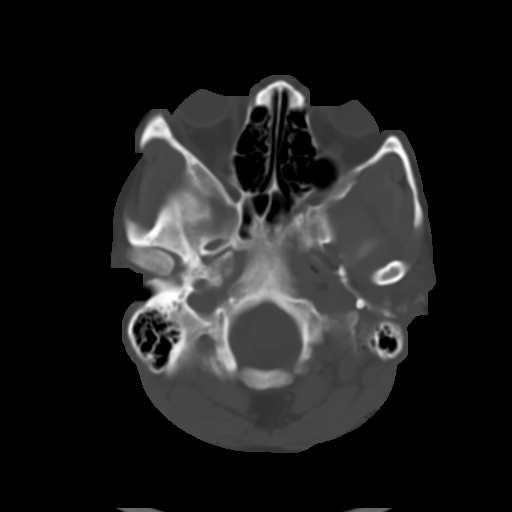
[im 5/30  brain]
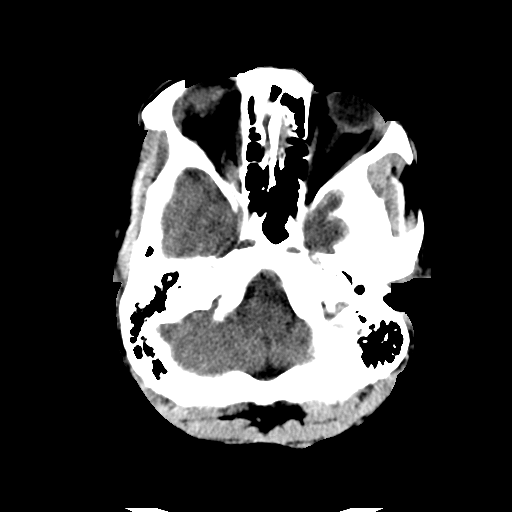
[im 7/30  brain]
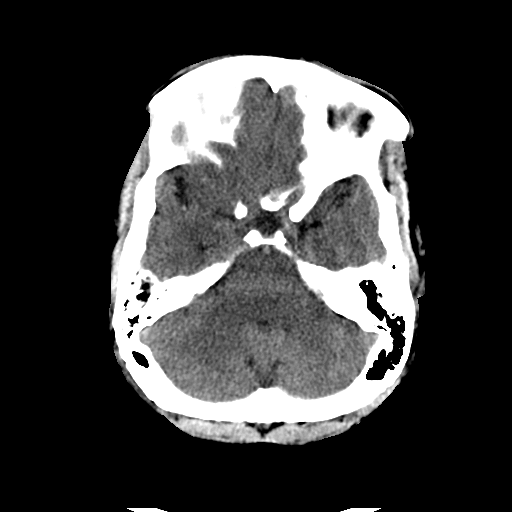
[im 9/30  brain]
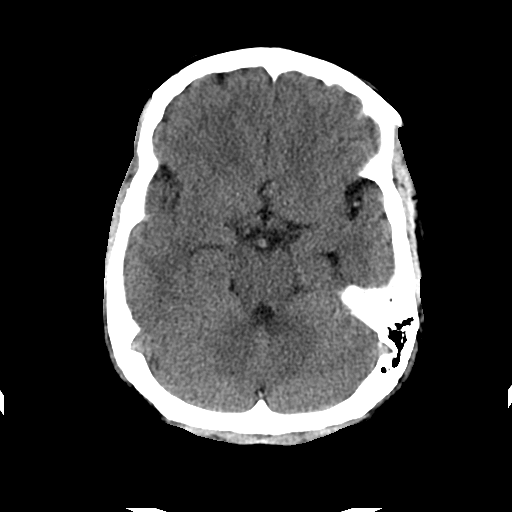
[im 11/30  brain]
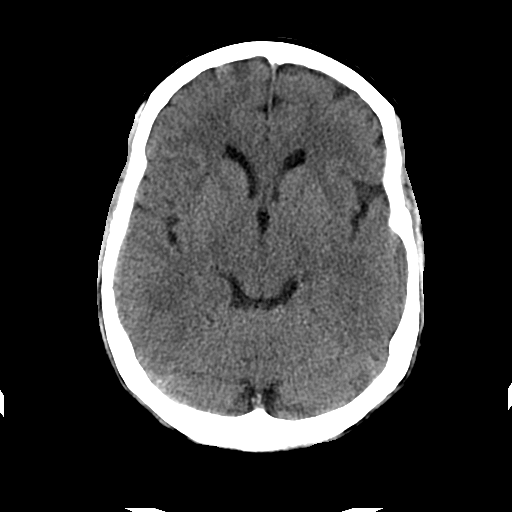
[im 11/30  bone]
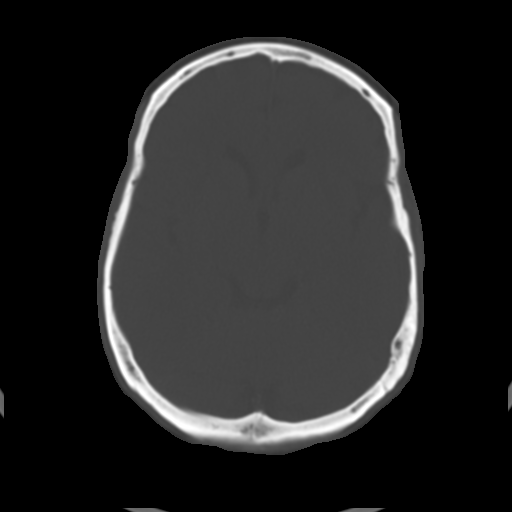
[im 13/30  brain]
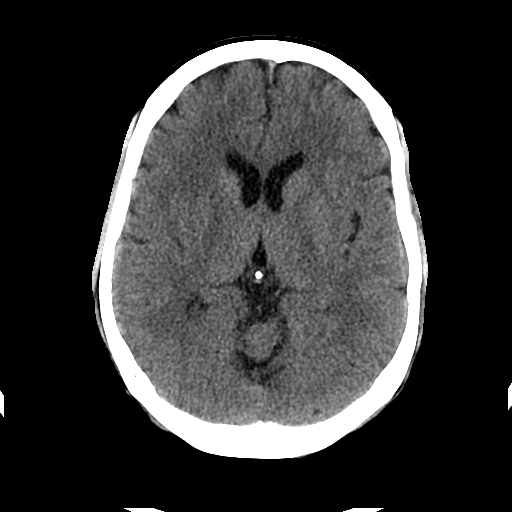
[im 17/30  brain]
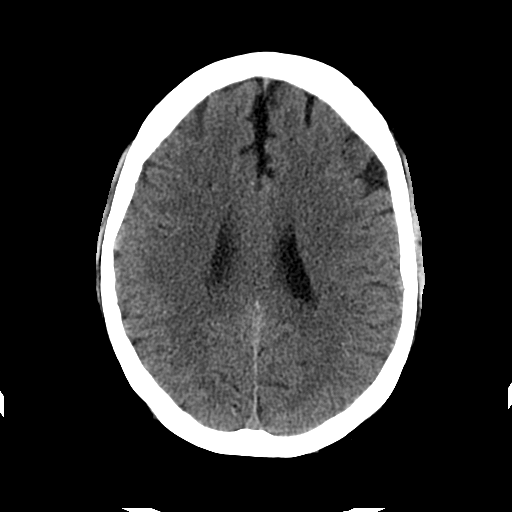
[im 19/30  brain]
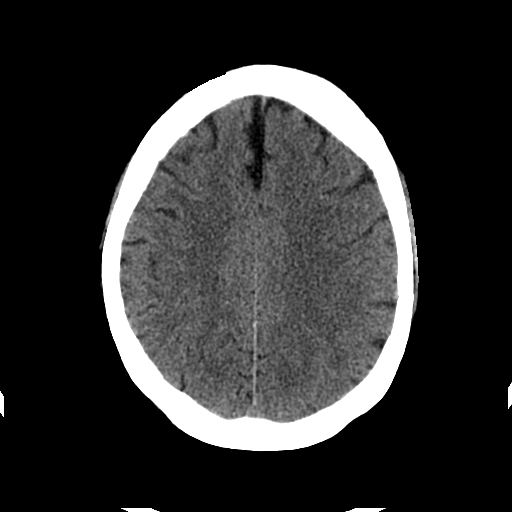
[im 21/30  brain]
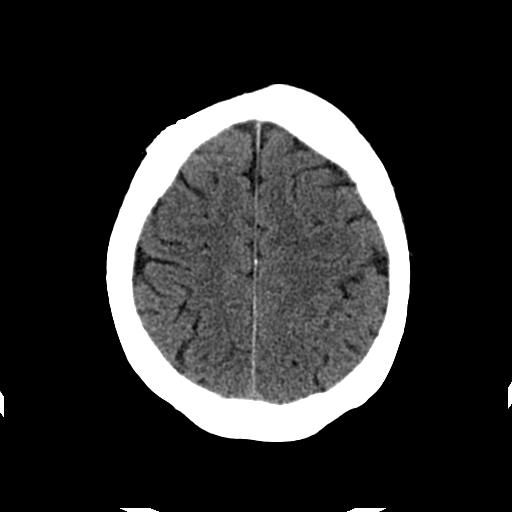
[im 21/30  bone]
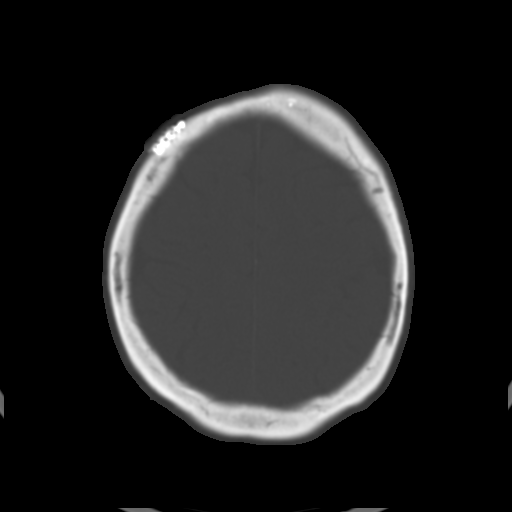
[im 23/30  brain]
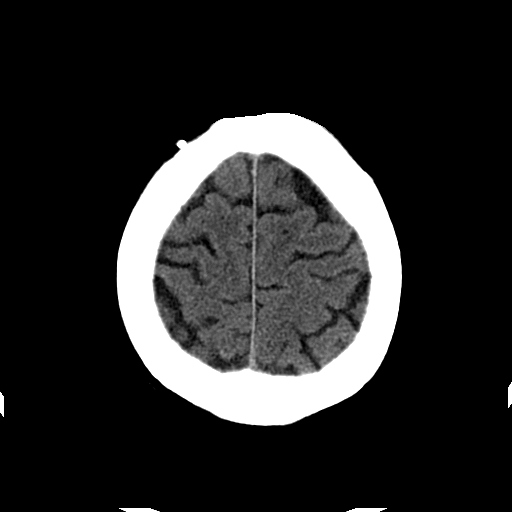
[im 25/30  brain]
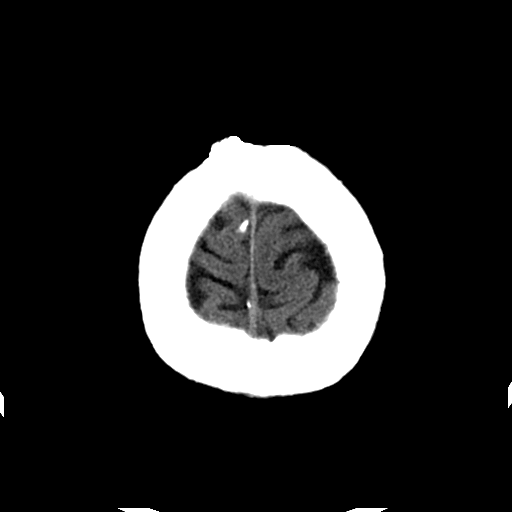
[im 27/30  brain]
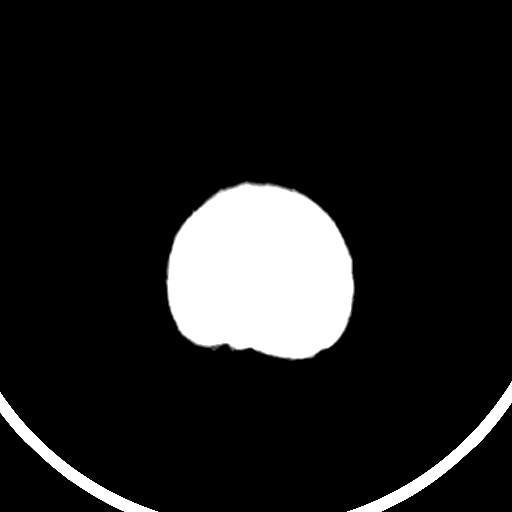

[Series 203: coronal st, idose (1) · coronal · 0.40mm/px · 3 of 70 slices shown]
[im 24/70  brain]
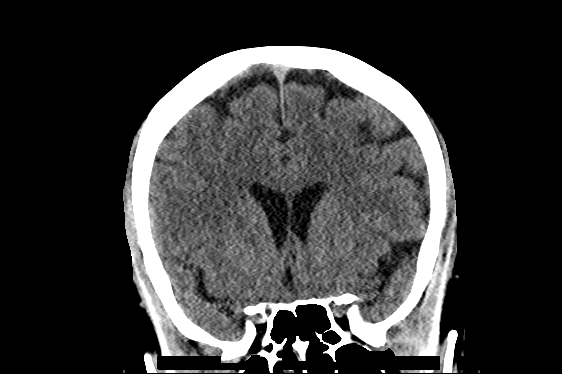
[im 31/70  brain]
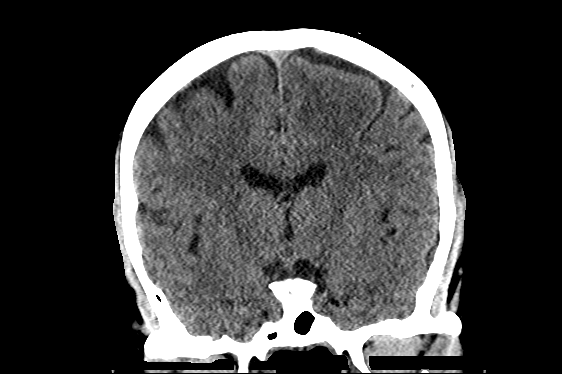
[im 39/70  brain]
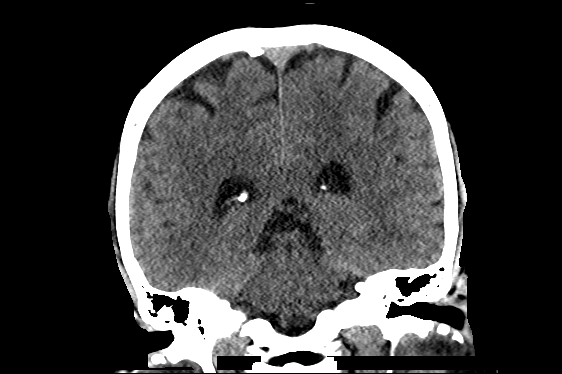

[Series 204: sagittal st, idose (1) · sagittal · 0.40mm/px · 3 of 75 slices shown]
[im 25/75  brain]
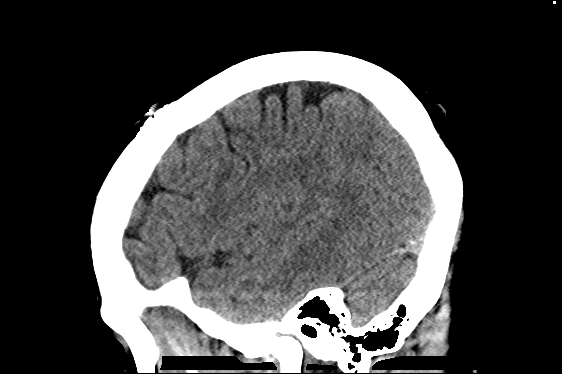
[im 38/75  brain]
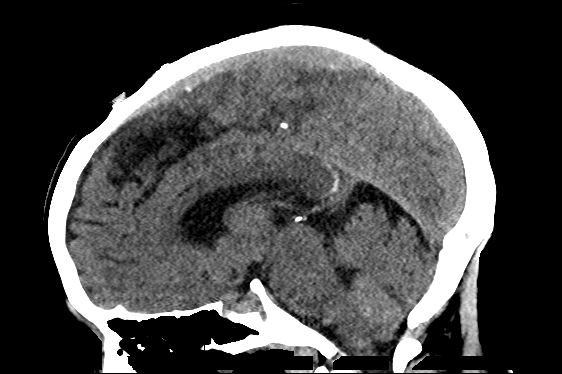
[im 50/75  brain]
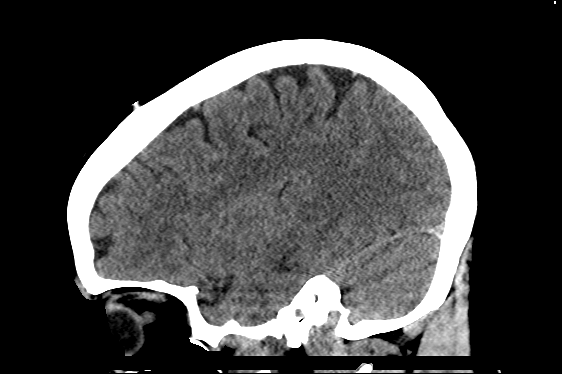

[18 of 47 positions shown; findings below may reference images not displayed]

FINDINGS: Brain: There is no evidence of acute cortical infarct, intracranial
hemorrhage, mass, midline shift, or extra-axial fluid collection.
The ventricles and sulci are normal.

Vascular: No hyperdense vessel or unexpected calcification.

Skull: Prior right frontal craniotomy.

Sinuses/Orbits: No acute finding.

Other: None.
IMPRESSION: No evidence of acute intracranial abnormality.

## 2019-11-06 ENCOUNTER — Encounter: Payer: Self-pay | Admitting: Physical Medicine & Rehabilitation

## 2019-11-06 ENCOUNTER — Inpatient Hospital Stay: Payer: Medicare Other

## 2019-11-06 ENCOUNTER — Inpatient Hospital Stay
Admission: RE | Admit: 2019-11-06 | Discharge: 2019-11-16 | DRG: 098 | Disposition: A | Discharge status: Home / Self Care | Payer: Medicare Other | Source: Other Acute Inpatient Hospital | Attending: Physical Medicine & Rehabilitation | Admitting: Physical Medicine & Rehabilitation

## 2019-11-06 DIAGNOSIS — M797 Fibromyalgia: Secondary | ICD-10-CM | POA: Diagnosis present

## 2019-11-06 DIAGNOSIS — E7800 Pure hypercholesterolemia, unspecified: Secondary | ICD-10-CM | POA: Diagnosis present

## 2019-11-06 DIAGNOSIS — Z79890 Hormone replacement therapy (postmenopausal): Secondary | ICD-10-CM

## 2019-11-06 DIAGNOSIS — R8281 Pyuria: Secondary | ICD-10-CM | POA: Diagnosis not present

## 2019-11-06 DIAGNOSIS — R630 Anorexia: Secondary | ICD-10-CM | POA: Diagnosis present

## 2019-11-06 DIAGNOSIS — E039 Hypothyroidism, unspecified: Secondary | ICD-10-CM | POA: Diagnosis present

## 2019-11-06 DIAGNOSIS — Z79899 Other long term (current) drug therapy: Secondary | ICD-10-CM

## 2019-11-06 DIAGNOSIS — I10 Essential (primary) hypertension: Secondary | ICD-10-CM | POA: Diagnosis present

## 2019-11-06 DIAGNOSIS — K580 Irritable bowel syndrome with diarrhea: Secondary | ICD-10-CM | POA: Diagnosis present

## 2019-11-06 DIAGNOSIS — G0481 Other encephalitis and encephalomyelitis: Principal | ICD-10-CM | POA: Diagnosis present

## 2019-11-06 DIAGNOSIS — M5116 Intervertebral disc disorders with radiculopathy, lumbar region: Secondary | ICD-10-CM | POA: Diagnosis present

## 2019-11-06 DIAGNOSIS — K219 Gastro-esophageal reflux disease without esophagitis: Secondary | ICD-10-CM | POA: Diagnosis present

## 2019-11-06 DIAGNOSIS — G378 Other specified demyelinating diseases of central nervous system: Secondary | ICD-10-CM

## 2019-11-06 DIAGNOSIS — F419 Anxiety disorder, unspecified: Secondary | ICD-10-CM | POA: Diagnosis present

## 2019-11-06 DIAGNOSIS — D649 Anemia, unspecified: Secondary | ICD-10-CM | POA: Diagnosis present

## 2019-11-06 DIAGNOSIS — J45909 Unspecified asthma, uncomplicated: Secondary | ICD-10-CM | POA: Diagnosis present

## 2019-11-06 DIAGNOSIS — G40909 Epilepsy, unspecified, not intractable, without status epilepticus: Secondary | ICD-10-CM | POA: Diagnosis present

## 2019-11-06 DIAGNOSIS — Z9049 Acquired absence of other specified parts of digestive tract: Secondary | ICD-10-CM

## 2019-11-06 DIAGNOSIS — E785 Hyperlipidemia, unspecified: Secondary | ICD-10-CM | POA: Diagnosis present

## 2019-11-06 DIAGNOSIS — G8220 Paraplegia, unspecified: Secondary | ICD-10-CM | POA: Diagnosis present

## 2019-11-06 DIAGNOSIS — Z87440 Personal history of urinary (tract) infections: Secondary | ICD-10-CM

## 2019-11-06 DIAGNOSIS — K5900 Constipation, unspecified: Secondary | ICD-10-CM | POA: Diagnosis not present

## 2019-11-06 DIAGNOSIS — Z86011 Personal history of benign neoplasm of the brain: Secondary | ICD-10-CM

## 2019-11-06 DIAGNOSIS — E119 Type 2 diabetes mellitus without complications: Secondary | ICD-10-CM | POA: Diagnosis present

## 2019-11-06 LAB — GLUCOSE WHOLE BLOOD - POCT
Whole Blood Glucose POCT: 95 mg/dL (ref 70–100)
Whole Blood Glucose POCT: 117 mg/dL — ABNORMAL HIGH (ref 70–100)

## 2019-11-06 MED ORDER — ZONISAMIDE 100 MG PO CAPS
200.00 mg | ORAL_CAPSULE | Freq: Two times a day (BID) | ORAL | Status: DC
Start: 2019-11-06 — End: 2019-11-16
  Administered 2019-11-06 – 2019-11-16 (×20): 200 mg via ORAL
  Filled 2019-11-06 (×21): qty 2

## 2019-11-06 MED ORDER — OXYCODONE HCL 5 MG PO TABS
5.0000 mg | ORAL_TABLET | Freq: Four times a day (QID) | ORAL | Status: DC | PRN
Start: 2019-11-06 — End: 2019-11-16
  Administered 2019-11-07 – 2019-11-16 (×9): 5 mg via ORAL
  Filled 2019-11-06 (×9): qty 1

## 2019-11-06 MED ORDER — DULOXETINE HCL 30 MG PO CPEP
60.00 mg | ORAL_CAPSULE | Freq: Every day | ORAL | Status: DC
Start: 2019-11-07 — End: 2019-11-16
  Administered 2019-11-07 – 2019-11-16 (×10): 60 mg via ORAL
  Filled 2019-11-06 (×10): qty 2

## 2019-11-06 MED ORDER — ENOXAPARIN SODIUM 40 MG/0.4ML SC SOLN
40.00 mg | Freq: Every day | SUBCUTANEOUS | Status: DC
Start: 2019-11-07 — End: 2019-11-16
  Administered 2019-11-07 – 2019-11-16 (×10): 40 mg via SUBCUTANEOUS
  Filled 2019-11-06 (×10): qty 0.4

## 2019-11-06 MED ORDER — LEVOTHYROXINE SODIUM 25 MCG PO TABS
50.0000 ug | ORAL_TABLET | Freq: Every day | ORAL | Status: DC
Start: 2019-11-07 — End: 2019-11-16
  Administered 2019-11-07 – 2019-11-16 (×10): 50 ug via ORAL
  Filled 2019-11-06 (×10): qty 2

## 2019-11-06 MED ORDER — POLYETHYLENE GLYCOL 3350 17 G PO PACK
17.00 g | PACK | Freq: Every day | ORAL | Status: DC | PRN
Start: 2019-11-06 — End: 2019-11-16

## 2019-11-06 MED ORDER — GLUCAGON 1 MG IJ SOLR (WRAP)
1.00 mg | INTRAMUSCULAR | Status: DC | PRN
Start: 2019-11-06 — End: 2019-11-16

## 2019-11-06 MED ORDER — LIDOCAINE 5 % EX PTCH
1.00 | MEDICATED_PATCH | CUTANEOUS | Status: DC
Start: 2019-11-07 — End: 2019-11-16
  Administered 2019-11-07 – 2019-11-16 (×10): 1 via TRANSDERMAL
  Filled 2019-11-06 (×11): qty 1

## 2019-11-06 MED ORDER — DRONABINOL 2.5 MG PO CAPS
2.50 mg | ORAL_CAPSULE | Freq: Every day | ORAL | Status: DC
Start: 2019-11-07 — End: 2019-11-12
  Administered 2019-11-07 – 2019-11-12 (×6): 2.5 mg via ORAL
  Filled 2019-11-06 (×6): qty 1

## 2019-11-06 MED ORDER — GLUCOSE 40 % PO GEL
15.00 g | ORAL | Status: DC | PRN
Start: 2019-11-06 — End: 2019-11-16

## 2019-11-06 MED ORDER — EZETIMIBE 10 MG PO TABS
10.0000 mg | ORAL_TABLET | Freq: Every evening | ORAL | Status: DC
Start: 2019-11-06 — End: 2019-11-16
  Administered 2019-11-06 – 2019-11-15 (×10): 10 mg via ORAL
  Filled 2019-11-06 (×10): qty 1

## 2019-11-06 MED ORDER — INSULIN LISPRO 100 UNIT/ML SC SOLN
1.00 [IU] | Freq: Three times a day (TID) | SUBCUTANEOUS | Status: DC
Start: 2019-11-06 — End: 2019-11-08

## 2019-11-06 MED ORDER — VITAMIN E 400 UNITS PO CAPS
400.00 [IU] | ORAL_CAPSULE | Freq: Every day | ORAL | Status: DC
Start: 2019-11-07 — End: 2019-11-12
  Administered 2019-11-07 – 2019-11-12 (×6): 400 [IU] via ORAL
  Filled 2019-11-06 (×7): qty 1

## 2019-11-06 MED ORDER — FOLIC ACID 1 MG PO TABS
1.0000 mg | ORAL_TABLET | Freq: Every day | ORAL | Status: DC
Start: 2019-11-07 — End: 2019-11-07
  Administered 2019-11-07: 09:00:00 1 mg via ORAL
  Filled 2019-11-06: qty 1

## 2019-11-06 MED ORDER — ACETAMINOPHEN 325 MG PO TABS
650.0000 mg | ORAL_TABLET | Freq: Four times a day (QID) | ORAL | Status: DC | PRN
Start: 2019-11-06 — End: 2019-11-16

## 2019-11-06 MED ORDER — VITAMIN B-12 500 MCG PO TABS
1000.0000 ug | ORAL_TABLET | Freq: Every day | ORAL | Status: DC
Start: 2019-11-07 — End: 2019-11-07
  Administered 2019-11-07: 09:00:00 1000 ug via ORAL
  Filled 2019-11-06: qty 2

## 2019-11-06 MED ORDER — INSULIN GLARGINE 100 UNIT/ML SC SOLN
5.00 [IU] | Freq: Every evening | SUBCUTANEOUS | Status: DC
Start: 2019-11-06 — End: 2019-11-07
  Administered 2019-11-06: 22:00:00 5 [IU] via SUBCUTANEOUS
  Filled 2019-11-06: qty 5

## 2019-11-06 MED ORDER — LOSARTAN POTASSIUM 25 MG PO TABS
25.0000 mg | ORAL_TABLET | Freq: Every day | ORAL | Status: DC
Start: 2019-11-07 — End: 2019-11-16
  Administered 2019-11-07 – 2019-11-15 (×9): 25 mg via ORAL
  Filled 2019-11-06 (×10): qty 1

## 2019-11-06 MED ORDER — VITAMIN D3 25 MCG (1000 UT) PO TABS
25.0000 ug | ORAL_TABLET | Freq: Every day | ORAL | Status: DC
Start: 2019-11-07 — End: 2019-11-16
  Administered 2019-11-07 – 2019-11-16 (×10): 25 ug via ORAL
  Filled 2019-11-06 (×10): qty 1

## 2019-11-06 MED ORDER — ACETAMINOPHEN 500 MG PO TABS
1000.0000 mg | ORAL_TABLET | Freq: Three times a day (TID) | ORAL | Status: DC
Start: 2019-11-06 — End: 2019-11-16
  Administered 2019-11-06 – 2019-11-16 (×21): 1000 mg via ORAL
  Filled 2019-11-06 (×29): qty 2

## 2019-11-06 MED ORDER — ALPRAZOLAM 0.5 MG PO TABS
2.0000 mg | ORAL_TABLET | Freq: Every evening | ORAL | Status: DC
Start: 2019-11-06 — End: 2019-11-16
  Administered 2019-11-06 – 2019-11-15 (×10): 2 mg via ORAL
  Filled 2019-11-06 (×10): qty 4

## 2019-11-06 MED ORDER — THIAMINE (VITAMIN B1) 100 MG PO TABS (WRAP)
50.0000 mg | ORAL_TABLET | Freq: Every day | ORAL | Status: DC
Start: 2019-11-07 — End: 2019-11-07
  Administered 2019-11-07: 09:00:00 50 mg via ORAL
  Filled 2019-11-06: qty 1

## 2019-11-06 MED ORDER — SODIUM CHLORIDE 0.9 % IV SOLN
150.00 mg | Freq: Two times a day (BID) | INTRAVENOUS | Status: DC
Start: 2019-11-06 — End: 2019-11-06
  Filled 2019-11-06 (×2): qty 15

## 2019-11-06 MED ORDER — FERROUS SULFATE 324 (65 FE) MG PO TBEC
324.00 mg | DELAYED_RELEASE_TABLET | Freq: Every morning | ORAL | Status: DC
Start: 2019-11-07 — End: 2019-11-07
  Administered 2019-11-07: 09:00:00 324 mg via ORAL
  Filled 2019-11-06: qty 1

## 2019-11-06 MED ORDER — PANTOPRAZOLE SODIUM 40 MG IV SOLR
40.00 mg | Freq: Every day | INTRAVENOUS | Status: DC
Start: 2019-11-07 — End: 2019-11-07
  Filled 2019-11-06: qty 40

## 2019-11-06 MED ORDER — INSULIN LISPRO 100 UNIT/ML SC SOLN
1.00 [IU] | Freq: Every evening | SUBCUTANEOUS | Status: DC
Start: 2019-11-06 — End: 2019-11-08

## 2019-11-06 MED ORDER — METHOCARBAMOL 500 MG PO TABS
750.0000 mg | ORAL_TABLET | Freq: Three times a day (TID) | ORAL | Status: DC | PRN
Start: 2019-11-06 — End: 2019-11-16
  Administered 2019-11-07 – 2019-11-15 (×6): 750 mg via ORAL
  Filled 2019-11-06 (×6): qty 2

## 2019-11-06 MED ORDER — ROSUVASTATIN CALCIUM 10 MG PO TABS
40.0000 mg | ORAL_TABLET | Freq: Every evening | ORAL | Status: DC
Start: 2019-11-06 — End: 2019-11-16
  Administered 2019-11-06 – 2019-11-15 (×10): 40 mg via ORAL
  Filled 2019-11-06 (×10): qty 4

## 2019-11-06 MED ORDER — DEXTROSE 50 % IV SOLN
12.50 g | INTRAVENOUS | Status: DC | PRN
Start: 2019-11-06 — End: 2019-11-16

## 2019-11-06 MED ORDER — AMPHETAMINE-DEXTROAMPHETAMINE 5 MG PO TABS
20.0000 mg | ORAL_TABLET | Freq: Two times a day (BID) | ORAL | Status: DC
Start: 2019-11-06 — End: 2019-11-16
  Administered 2019-11-06 – 2019-11-16 (×20): 20 mg via ORAL
  Filled 2019-11-06 (×13): qty 4
  Filled 2019-11-06: qty 2
  Filled 2019-11-06: qty 4
  Filled 2019-11-06: qty 2
  Filled 2019-11-06 (×5): qty 4

## 2019-11-06 MED ORDER — LACOSAMIDE 50 MG PO TABS
150.0000 mg | ORAL_TABLET | Freq: Two times a day (BID) | ORAL | Status: DC
Start: 2019-11-06 — End: 2019-11-16
  Administered 2019-11-06 – 2019-11-16 (×20): 150 mg via ORAL
  Filled 2019-11-06 (×20): qty 3

## 2019-11-06 NOTE — Rehab Liaison Note ([facility]) (Addendum)
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   Guardian/Power of Attorney:   Patient does not have a Advertising account planner.   [facility] Medical Record Number: No [place] MRN  Past Medical History: Interstitial Cystitis  DM  HTN  Hypothyroidism  Asthma  GERD/IBS  Seizures D/O  Fibromyalgia  DDD Lumbar Spine  Overactive Bladder  Chronic Constipation  GI bleed  Esophageal Stricture    PSH:  Appendectomy  Bladder sling  Hysterectomy  BS and ovarian removal  Craniotomy secondary to meningioma  Breast Bx's  Bilateral knee meniscus surgeries  Cataract Surgeries  Rhinoplasty  Rotator cuff surgery    Prior Level of Functioning:  Mobility:   I with mobility, no AD  Activities of Daily Living:  I with ADL's and self care  Cognition:  WNL  Communication:  WNL  Swallowing:  WNL    CARE Tool  Prior Functioning:  Self Care: Patient completed the activities by him/herself, with or without an  assistive device, with no assistance from a helper.  Indoor Mobility: Patient completed the activities by him/herself, with or  without an assistive device, with no assistance from a helper.  Stairs: Patient completed the activities by him/herself, with or without an  assistive device, with no assistance from a helper.  Functional Cognition: Patient completed the activities by him/herself, with or  without an  assistive device, with no assistance from a helper.  Prior Device Use: Patient does not use manual or motorized wheelchair or  scooter, mechanical lift, walker, or an orthotic/prosthesis.  Health Conditions: Patient has not had any falls in the past year. Patient has  had major surgery during the 100 days prior to admission.    Social History:  Marital Status: Married  Children: unknown          Reside:  unknown  Employment Status:  Currently Unemployed  Recreational Activities/Hobbies:  unknown  Pre-hospital Living Environment: The patient lives with her spouse in a 1 level  home with 1 STE in [place].  The patient was completely I prior and does not own  any equipment.  Her spouse is able to assist the patient at home after rehab.  The patient has a shower bench.  + driving.  Language:  English  Hand Dominance: Right.    The following information was gathered for consideration and maintenance in the  medical record to substantiate medical necessity for an IRF level of care.    This assessment is being completed by Minta Balsam , RN, BC . Patient is  currently at [facility] [phone]. The SW is Sheppard Penton [phone].    The patient is being referred and recommended by their physician, Kenyon Ana  [facility], multiple practitioners, to be assessed both  medically and functionally in regard to their premorbid functional capacity to  determine whether they can benefit  from a rehabilitation level of care offered  by our facility.    The following is information regarding the medical complexity and clinical risk  factors that needs to be considered for the appropriate management of the  patient's care and recovery.  Acute Medical Conditions: 1. 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  post op complicated by subacute progressive  myelopathy.  The patient was also receiving TPN, now discontinued and the  patient  is tolerating diet well with improved intake. On Marinol-appetite  stimulant. Regular diet with Ensure BID.+BM.  2. 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  Imaging significant for transverse myelitis with patchy, non diffusing T2  abnormalities involving the corticospinal tracts.  Completed 5/5 PLEX started on  09/25/19 with improvement noted.  EMG completed on 10/14/19 without s/o of  neuropathy.  PM+R consult completed as well as neurology consult.  3. UTI/Cystitis- Started on Nitrofurontinin SR 100 mg BID  Started on March 10th  4.Myeloradicular Lumbar back pain- continues on Tylenol, Cymbalta, Robaxin, and  Lidoderm patches.  5. Anemia-Continue Iron and monitor H+H  6.  MRSA Precautions-Resolved, Testing negative from 10/23/19, pt removed from  serial precautions.  7. Hx of DM, Continue Insulin as ordered.  8.  Seizure Disorder, Hx of Craniotomy for Meningioma resection.  Continues on  PO Vimpat.  9. Hypothyroidism-Continue on PO Synthroid  History of Present Illness:  Christy Mcintosh is a 59 year old female originally  admitted to [facility] in January 2021 with colonic  inertia.  She was taken to the OR on 09/12/19 for Colonic Inertia, S/P Total  abdominal Colectomy with ileorectal anastamosis. Post op complicated by ilieus  and was transferred to [facility] on 10/11/19 for further treatment of post op needs  and new dx of subacute progressive myelopathy requiring Neurology consult.  The  patient was also receiving TPN, now discontinued and the patient is tolerating  diet well with improved intake. On Marinol-appetite stimulant. Regular diet with  Ensure BID.+BM.  Regarding monophasic Autoimmune Demyelinating  Myeloencephalitis-presented with subacute progressive myelopathy on 10/03/19  with BLE paraparesis and hyporeflexia.  Imaging significant for transverse  myelitis with patchy,  non diffusing T2 abnormalities involving the corticospinal  tracts.  Completed 5/5 PLEX started on 09/25/19 with improvement noted.  EMG  completed on 10/14/19 without s/o of neuropathy.  PM+R consult completed as well  as neurology consult.  The patient was also followed for the following acute  medical conditions: UTI/Cystitis- Started on Nitrofurontinin SR 100 mg BID  Started on March 10th, myeloradicular lumbar back pain- continues on Tylenol,  Cymbalta, Robaxin, and Lidoderm patches, Anemia-Continue Iron and monitor H+H  6.  MRSA Precautions-Resolved, Testing negative from 10/23/19, pt removed from  serial precautions. Hx of DM, Continue Insulin as ordered. Seizure Disorder, Hx  of Craniotomy for Meningioma resection.  Continues on PO Vimpat.   Hypothyroidism-Continue on PO Synthroid.  The patient is alert and oriented x4,  pleasant and cooperative with care needs. Tolerating regular diet well, + BM.  Paraparesis improving and the patient is working well with therapy services with  progress made.  The patient has been working well with therapy services to  include PT and OT to address functional mobility, self care deficit, and pain.  The patient would benefit from an intensive level of rehab along with close  medical management of multiple medical conditions and rehab MD to drive the  rehab process.  The patient is medically stable for IRF level of care.    This patient has been admitted to the inpatient rehab unit to respond to the  COVID - 19 public health emergency under the CMS 1135 waiver to assist with  hospitalization surge and care capacity constraints. This patient does meet  admission criteria to an inpatient rehab level of care, per CMS guidelines,  however their primary diagnosis and reason for admission does not meet 60% rule  criteria.     Date of Onset: 09/12/19   Date Admitted to Acute:  09/12/19  Current Precautions:   Risk for falls.   Skin fragility.  Food Allergies  No known food  allergies.  Medication Allergies   Cubersin, Vancomycin, Levaquin, Sulfa Drugs, Ambien, Tylox, Vicodin, Unknown  reaction.  Other Allergies Adhesive Tape: Unknown reaction.    Present Systems Summary:  Vital signs: Maximum Temperature (last 48 hrs):  98.5 degrees.  Pulse:  62 beats per minute.  Respirations:  18 breaths per minute.  Blood Pressure:   119/79 mmHg.  Pulse Ox: 100% on RA  Height/weight:  Height: 6'1"  Weight: 147 lbs  Hearing: No current issues  Vision: No current issues.  Other medical comments:   Full code  DIET:  Current Diet Texture: Regular diet.  Current Liquid Consistency: Thin liquids.  Type: Consistent carbohydrate  Bladder: Patient is continent of bladder.  Bowel: Patient does not have an ostomy.  Date of Last Bowel Movement:  11/03/19 Patient is continent of bowel.  Integumentary:   Surgical Site. abdominal incisional site, CDI  Cardiopulmonary:   Room Air.   Incentive Spirometry:   Respiratory Endurance:  Good  Dialysis: Patient currently is not receiving dialysis.  Current Medication(s): 11/04/19   Acetaminophen 975 milligrams oral every eight hours  Xanax IR 2 milligrams oral every HS  vitamin D3 1000 units oral daily  Vitamin B12 1000 mcg intramuscularly every 48 hours  Cymbalta 60 milligrams oral daily  Lovenox 40 milligrams subcutaneously every evening  Zetia 10 milligrams oral daily  Ferrous sulfate 325 milligrams oral daily  Folic acid 1 milligram oral daily  Lantus 5 units subcutaneously every evening  Vimpat 150 milligrams IV BID  Synthroid 50 micrograms oral every morning  Lidoderm Topical one each topical every 24 hours  Cozaar 25 milligrams oral daily  Protonix 40 milligrams oral every 24 hours  Crestor 40 milligrams oral every evening  Vitamin B-1 25 milligrams oral daily  Vitamin E 400 units oral daily  Zonegran 200 milligrams oral BID  Marinol 2.5 mg oral every 24 hours  amphetamine dextroamphetamine 20 mg oral BID  Current Alcohol Use:  Patient does not consume  alcohol  Current Tobacco Use:  No, patient does not use tobacco  Current Drug Use: No, patient does not use recreational drugs.  Pain: Patient currently has pain.  Location:  Type: Acute    Additional medical documentation contributing to the expected care of this  patient may be noted in the following areas:  Laboratory-Chemistry/Hematology: 10/31/19  10/31/19  Sodium 140  Potassium 4.2  Chloride 105  CO2 28  BUN 10.4  Creatinine 0.61  Glucose 97    11/05/19  Hemoglobin 8.2  Hematocrit 26.2    10/26/19  WBC 3.7  Hemoglobin 7.1  Hematocrit 23.1  Platelet 307   .  Cultures:   11/05/19  SARS/COVID-19 Screen Rapid Test  Negative  Radiology:   10/11/19 MRI of the T spine impression similar appearance of long segment  patchy T2 hyperintense signal involving the anterior and central cord from C7 to  T11. The differential considerations are unchanged an include a toxic metabolic,  infectious, inflammatory and possibly an ischemic etiology.    11/02/19 MRI of lumbar spine findings: partially imaged patchy T2 signal  abnormality within the distal thoracic spinal cord with focal T2 hyperintense  lesion at the T12- L1 level . No abnormal enhancement within the spinal cord or  spinal canal.    11/02/19 MRI of the brain : Impression: stable non contrast MRI of the brain to  include the known foci of T2 flair signal within the posterior limbs of the  bilateral internal capsules extending caudally into the cortical spinal tracks ,  and subcortical right frontal lobe. The signal within the pons is less  conspicuous on today's study which may be due to differences in technique.  IVs:  IV Access: No IV access.    Is patient presently participating in rehabilitation? Yes  Adjustment to Present Illness: Patient is coping adequately.   Patient is accepting limitations adequately.   Patient's expectations are realistic.   Patient is motivated.  Activity Tolerance:  Good.  SPECIAL NEEDS: None.   Are you currently experiencing fever and/or  symptoms of acute respiratory  illness (such as cough, difficulty breathing)? NO  Do you have a history of travel from affected geographic areas within 14 days of  symptom onset? NO  Did you have close contact with a laboratory confirmed COVID-19 person? NO  Does the person reside in a nursing home or long-term care facility or assisted  living facility? NO  Was the patient ever tested for COVID? YES  Document COVID test results/date: Negative on 11/05/19    Current Functional Status  Weight-bearing Status:   No Restrictions  Mobility: 11/04/19   Rolling: Supervision/Stand by assistance.   Supine to sit: Supervision/Stand by assistance.   Sit to stand: Contact guard/Minimal assistance.   Transfers OOB: Supervision/Stand by assistance.   Ambulation: Moderate assistance.   Ambulation Device: RW   Ambulation Distance: 30'x2 with LOB and ataxia.  Activities of Daily Living: 11/04/19  Eating: Supervision/Stand by assistance.  Grooming: Supervision/Stand by assistance. Bathing UB: Contact guard/Minimal  assistance. Bathing LB: Moderate assistance. Dressing UB: Moderate assistance.  Dressing LB: Maximal assistance. Toileting: Maximal assistance.  Cognition: 11/04/19 Alert/Oriented. x4 Follows commands. No communication  deficits noted.  Communication: 11/04/19 None noted at this time.  Swallowing:  11/04/19   No swallowing deficits present.  Other Impairments: Gait abnormality Impaired sensation Lack of coordination    Patient is able to understand and make healthcare decisions  Yes  Payer Source: Level of care will be discussed with the following payer sources  if/when applicable.  Primary: Medicare A  Secondary: Medicare B    Information and Case Discussion:   Rehabilitation risks/benefits were reviewed.  Patient/family/caregiver agrees to/accepts rehabilitation risks/benefits.   Rehab literature/brochure was provided to: patient .  Case will be discussed  with Physician/Medical Director.        IRF Admission  Approval/Non-Approval  Appropriateness for admission to the Inpatient Rehabilitation Facility:  The  patient's condition is sufficiently stable to allow active participation in an  intensive interdisciplinary inpatient rehabilitation program. The patient would  benefit from interdisciplinary inpatient rehabilitation provided by a physician,  rehab-focused nursing, and a minimum of two rehab therapies which will provide  specialized care for the following functional deficits:   Bladder Management.  Bowel Management.  Endocrine  Hydration  Leisure Skills  Metabolic Function  Mobility  Nutrition  Pain Management  Psychosocial  Risk of Infection  Safety Risk  Self Care Management  Skin Wound Management  Comorbid conditions present at pre-admission:  The interdisciplinary team will also manage the potential risks and  complications from the following comorbid conditions:     Interstitial Cystitis  DM  HTN  Hypothyroidism  Asthma  GERD/IBS  Seizures D/O  Fibromyalgia  DDD Lumbar Spine  Overactive Bladder  Chronic Constipation  GI bleed  Esophageal Stricture  High risk for constipation  Pain  Recommended services:  The recommended interdisciplinary team will be comprised of the following  services:   Medical Supervision.  24 Hour Rehabilitation Nursing.  Physical Therapy.  Occupational Therapy.  Psychology.  Therapeutic Recreation.  Registered Dietitian.  Patient's expected intensity and frequency of participation in the  interdisciplinary rehabilitation program: is 3 hours of therapy 5 days/week.  Prognosis and level of expected improvement with inpatient rehabilitation stay  is:   The patient will likely progress with course of intensive inpatient acute  rehabilitation and be discharged to home with assistance from spouse and/or  family members.  It is anticipated the patient will achieve level of I-Mod I  with least restrictive device with supervision.  The patient will require  intensive occupational therapy and  physical therapy to work on bed mobility,  transfers, gait, strengthening, range of motion, balance, endurance,  coordination, basic ADLs, and safety.   The patient will have 24 hour rehab  nursing to monitor vital signs, administer medications and education, skin/wound  infection prevention, pain control, bowel and bladder, bladder scans if  necessary, fall prevention, and reinforcement of therapy strategies.  Patient  will have case management for discharge planning and team coordination.  Patient  will have registered dietician for weight management as needed.  Patient?s  family will be scheduled for family/patient education.  Patient will have  therapeutic recreation for community re-entry and adaptive equipment.  Patient  will have frequent visits by physiatry who will assess, plan, implement, and  evaluate the individualized rehabilitation program for this patient.  Estimated date of admission to acute inpatient:   11/05/19  Estimated length of inpatient rehabilitation stay in order to achieve rehab  medical/functional goals:   7-10 days  Anticipated destination post discharge from inpatient rehabilitation is:   community discharge with assistance.    Anticipate patient will need the following services post discharge from  inpatient rehabilitation: Home health therapy. Home health nursing.    Physician Approval Status of Admission: Admission Approval:  The patient's  condition is sufficiently stable to allow active participation in an intensive  interdisciplinary inpatient rehabilitation program. 11/05/2019@1527hrs   59 yr old  lady with demyelinating myelopathy and paraparesis plus total colectomy.  She is  in need of rehab at IRF intensity with close medical management.  RVG    This assessment denotes that on admission to the inpatient rehabilitation  facility, our physician will provide documentation that demonstrates clinical  rehabilitation complications for which the patient is at risk and a specific  plan  to avoid those risks. Further, the medical conditions present create  possible adverse conditions that predictably can be controlled through an  intensive rehabilitation plan of care to be outlined at admission.    Department of Medical Assistance Services [facility])  Intensive Rehabilitation Admission Certification    I. Certification Statement:    In accordance with 42 CFR 456.60, I certify that Georgiana Shore  meets the  admission criteria  for intensive rehabilitation services set forth in 12 VAC  30-60-120.    II. Criteria Determination:  (In order to meet intensive rehabilitation criteria, the recipient must require  all the items listed below)    The rehabilitation cannot be safely and adequately carried out in a less  intensive setting; and    The interdisciplinary coordinated team approach is required; and    The recipient requires at least 2 of the the four therapies:    Physical Therapy services on a daily basis  Occupational Therapy services on a daily basis    III. Physician Signature Required:    Annotated by: Leonette Monarch  Annotated: 11/06/2019 4:23:00 PM  Unit Phone number at [facility] [phone]    Signed by: Minta Balsam, RN, [facility] 11/05/2019 3:13:00 PM    Physician CoSigned By: Jordan Hawks 11/05/2019 15:26:32

## 2019-11-06 NOTE — Rehab Evaluation ([facility]) (Signed)
NAMEAMBRIANA Mcintosh  MRN: [id number]  Account: [id number]  Session Start: 11/06/2019 12:00:00 AM  Session Stop: 11/06/2019 12:00:00 AM    Total Treatment Minutes:  Minutes    Rehabilitation Nursing  Inpatient Rehabilitation Admission Assessment - Functional Status and Care Plan    Rehab Diagnosis: Monophasic Demyelinating Myeloencephalitis  Demographics:            Age: 59Y            Gender: Female            Date of Onset: 09/12/19            Date of Admission: 11/06/2019 5:56:00 PM  Primary Language: [place]    Past Medical History: Interstitial Cystitis  DM  HTN  Hypothyroidism  Asthma  GERD/IBS  Seizures D/O  Fibromyalgia  DDD Lumbar Spine  Overactive Bladder  Chronic Constipation  GI bleed  Esophageal Stricture    PSH:  Appendectomy  Bladder sling  Hysterectomy  BS and ovarian removal  Craniotomy secondary to meningioma  Breast Bx's  Bilateral knee meniscus surgeries  Cataract Surgeries  Rhinoplasty  Rotator cuff surgery  History of Present Illness: Christy Mcintosh is a 59 year old female originally  admitted to [facility] in January 2021 with colonic  inertia.  She was taken to the OR on 09/12/19 for Colonic Inertia, S/P Total  abdominal Colectomy with ileorectal anastamosis. Post op complicated by ilieus  and was transferred to [facility] on 10/11/19 for further treatment of post op needs  and new dx of subacute progressive myelopathy requiring Neurology consult.  The  patient was also receiving TPN, now discontinued and the patient is tolerating  diet well with improved intake. On Marinol-appetite stimulant. Regular diet with  Ensure BID.+BM.  Regarding monophasic Autoimmune Demyelinating  Myeloencephalitis-presented with subacute progressive myelopathy on 10/03/19  with BLE paraparesis and hyporeflexia.  Imaging significant for transverse  myelitis with patchy, non diffusing T2 abnormalities involving the corticospinal  tracts.  Completed 5/5 PLEX started on 09/25/19 with improvement noted.   EMG  completed on 10/14/19 without s/o of neuropathy.  PM+R consult completed as well  as neurology consult.  The patient was also followed for the following acute  medical conditions: UTI/Cystitis- Started on Nitrofurontinin SR 100 mg BID  Started on March 10th, myeloradicular lumbar back pain- continues on Tylenol,  Cymbalta, Robaxin, and Lidoderm patches, Anemia-Continue Iron and monitor H+H  6.  MRSA Precautions-Resolved, Testing negative from 10/23/19, pt removed from  serial precautions. Hx of DM, Continue Insulin as ordered. Seizure Disorder, Hx  of Craniotomy for Meningioma resection.  Continues on PO Vimpat.   Hypothyroidism-Continue on PO Synthroid.  The patient is alert and oriented x4,  pleasant and cooperative with care needs. Tolerating regular diet well, + BM.  Paraparesis improving and the patient is working well with therapy services with  progress made.  The patient has been working well with therapy services to  include PT and OT to address functional mobility, self care deficit, and pain.  The patient would benefit from an intensive level of rehab along with close  medical management of multiple medical conditions and rehab MD to drive the  rehab process.  The patient is medically stable for IRF level of care.  Social History:  Marital Status: Married  Children: 4          Reside:  [place]  Employment Status:  Currently Unemployed  Recreational Activities/Hobbies:  unknown    Rehabilitation Precautions Restrictions:  Fall Precaution and Skin Precaution    Orientation to Rehabilitation: Patient was given instructions and information on  hospital orientation. Orientation was provided for the following areas:  Orientation checklist, Rehab handbook, Bed controls, Call light, Chaplain, Daily  routine, Meal times, Phone and phone numbers, Visiting hours  Patient/Caregiver Goals:  Patient's functional goals: "I want to get stronger  and go back home"    Wounds/Incisions: No wounds or  incisions.    Medication Review: No clinically significant medication issues identified this  shift.    CARE Tool  The scores below reflect the patient's usual function prior to therapeutic  intervention.    Self-Care Functional Assessment  Eating: 05 - Setup or Clean Up Assistance: Helper sets up or cleans up prior to  or following an activity  Assistive Device(s):  None    Oral Hygiene: 05 - Setup or Clean Up Assistance: Helper sets up or cleans up  prior to or following an activity  Assistive Device(s):   None  Context for oral hygiene:   Bed Level    Toileting Hygiene: 04 - Touching Assistance: Helper provides  touching/steadying/contact guard  Toileting Equipment:   Scientist, research (physical sciences) Device(s):   Grab bar    Shower/Bathe Self: 04 - Touching Assistance: Helper provides  touching/steadying/contact guard  Location:  Seated at sink  Assistive Device(s):   None    Upper Body Dressing: Undressing (taking off clothing) was observed/assessed.   Supervision or touching assistance:  Helper provides verbal cues and/or  touching/steadying and/or contact guard assistance as patient completes  activity.  Assistance Provided:   Touching, Steadying, Contact guard  Assistive Device(s):   None    Lower Body Dressing: Undressing (taking off clothing) was observed/assessed.  Supervision or touching assistance:  Helper provides verbal cues and/or  touching/steadying and/or contact guard assistance as patient completes  activity.  Assistance Provided:   Touching, Steadying, Contact guard  Assistive Device(s):   None    Putting On/Taking Off Footwear: Not observed/assessed.    Mobility Functional Assessment  Roll Left and Right: 05 - Setup or clean-up assistance:  Helper sets up or  cleans up; patient completes activity. Helper assists only prior to or following  the activity.  Assistive Device(s):   Bed rail    Sit to Lying: 04 - Supervision: Helper provides supervision for safety or verbal  cues ONLY  Assistive Device(s):   Bed  rail    Lying to Sitting on Side of Bed: 04 - Touching Assistance: Helper provides  touching/steadying/contact guard  Assistive Device(s):   Bed rail    Sit to Stand: 04 - Touching Assistance: Helper provides  touching/steadying/contact guard  Assistive Device(s):   Bed rail    Chair/Bed-to-Chair Transfer: Transfering to and from a bed were  observed/assessed. 04 - Touching Assistance: Helper provides  touching/steadying/contact guard  Assistive Device(s):   Bed rails    Toilet Transfer: 04 - Touching Assistance: Helper provides  touching/steadying/contact guard  Assistive Device(s):   Grab bars    Special Treatments, Procedures, and Programs: Patient did not receive total  parenteral nutrition treatment at the time of admission.    Bladder and Bowel Counts:                       Bladder (# only)             Bowel (# only)  Number of Episodes  Continent            1  0  Incontinent          0                            0    Interdisciplinary Educational Needs and Learning Preferences:       Learning Preference: The patient's preferred learning method is:  Explanation.       Barriers to Learning: Acuity of illness.       Learning Needs: Debility    Education Provided: Pain management. Pain scale. Medication options. Side  effects. Clinical indicators of pain. Skin/wound care. Prevention of skin  breakdown. Safety. Safety issues and interventions. Fall protocol. Medication.  Name and dosage. Administration. Purpose. Side Effects. Interaction. diabetes,  s/s of hypoglycemia and hyperglycemia .       Audience: Patient.       Mode: Explanation.       Response: Verbalized understanding.    Rehab Potential: Good family/social support, Motivated  Barriers to Progress/Discharge: Medical condition    TEAM CARE PLAN  Identified problems from team documentation:      Identified problems from this assessment:     Pain Management : Patient will verbalize pain less than or equal to 2 out of 10  with the  use of medication and positioning techniques   Skin Wound Management : Patient  will independently perform weight shifts to  prevent skin breakdown   Self Care Management : patient will require no more than minimum assistance for  bathing and dressing activities during hospitalization   Safety Risk : Patient will follow safety precautions and call for assistance  100% of the time during hospitalization    Discipline:  Nursing    Please review Integrated Patient View Care Plan Flowsheet for Team identified  Problems, Interventions, and Goals.    Signed by: Domenica Reamer, RN 11/06/2019 7:52:00 PM

## 2019-11-06 NOTE — H&P (Addendum)
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  She was taken to the OR on 09/12/19, and is now s/p total abdominal colectomy with ileorectal anastamosis. Post-surgical course was complicated by altered mental status which was thought to be secondary to medication adjustements from her pre-op medications.  She progressed slowly with minimal oral intake and requirement for TPN supplementation.      On Feb 11, hospital course was further complicated by sudden increase in fatigue, focal spinal tenderness, and new onset bilateral lower extremity weakness, hyporeflexia, and urinary urgency. Neurology was consulted, and diagnostic imaging ruled out stroke, but showed a patch long segment of T2 hyperintense signal involving at the anterior and central cord from C7-T11. This was thought to be due to subacute progressive myelopathy, from possible acute B12 deficiency, ischemic myelopathy, or other metabolic derangements from her post-op course.  This was shortly followed by minimal responsiveness, staring, and somnolence within the following 48 hours. There was concern for subclinical seizure activity, but 24h EEG could not be performed at [facility].  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  Patient notes that symptoms of  LE weakness developed while she was in the ICU after contracting a severe purulent UTI with cultures significant for >100k pan-susceptible E. Coliand post-op ileus for which she was treated with IV antibiotics (Ceftriaxone and Flagyl) and NGT compression.    She was transferred to [facility] on 10/11/19 for further treatment of post op needs and new dx of subacute progressive myelopathy requiring Neurology consult.   Completed 5/5 PLEX on 10/23/19 with significant improvement noted (patient states she initially could not move her legs).  EMG completed on 10/14/19 without s/o of neuropathy.  PM+R consult completed as well as neurology consult.  The patient was also treated for recurrent UTI/Cystitis 3/10-3/14 with Nitrofurontinin SR 100 mg BID. Cultures were positive for >50k lactose fermenting gram negative rods.  She was able to tolerate a regular diet prior to discharge and TPN was discontinued. Patient was seen by PT/OT/SLP and deemed appropriate for transfer to [facility] on 11/06/19.     Past Medical History:   Past Medical History:   Diagnosis Date    Asthma     Constipation     Degenerative disc disease, lumbar     Diabetes     Esophageal stricture     Fibromyalgia     GERD (gastroesophageal reflux disease)     Hypertension     Hypothyroidism     Seizures        Past Surgical History:   Past Surgical History:   Procedure Laterality Date    APPENDECTOMY      BILATERAL SALPINGOOPHORECTOMY      BLADDER SUSPENSION      breast biopsy      CATARACT EXTRACTION      CRANIOTOMY FOR EXCISION OF  GLOMUS TUMOR      Meningioma    HYSTERECTOMY      knee meniscal repairs Bilateral     RHINOPLASTY      ROTATOR CUFF REPAIR         Allergies:   Allergies   Allergen Reactions    Adhesive [Wound Dressing Adhesive]     Ambien [Zolpidem]     Levaquin [Levofloxacin]     Sulfa Antibiotics     Tylox [Oxycodone-Acetaminophen]     Vancomycin     Vicodin [Hydrocodone-Acetaminophen]            Family  History: No family history on file.    Functional history and social history:  Prior Functional Status: Independent with mobility and ADLs    Admission Functional Status:  Rolling: Supervision/Stand by assistance.   Supine to sit: Supervision/Stand by assistance.   Sit to stand: Contact guard/Minimal assistance.   Transfers OOB: Supervision/Stand by assistance.   Ambulation: Moderate assistance.   Ambulation Device: RW   Ambulation Distance: 30'x2 with LOB and ataxia.  Activities of Daily Living: 11/04/19  Eating: Supervision/Stand by assistance.  Grooming: Supervision/Stand by assistance. Bathing UB: Contact guard/Minimal  assistance. Bathing LB: Moderate assistance. Dressing UB: Moderate assistance.  Dressing LB: Maximal assistance. Toileting: Maximal assistance.    SocHx: Barrister's clerk  Illicit Drugs-Denies  Marital Status: Married  Children: unknown          Reside:  unknown  Employment Status:  Currently Unemployed  Recreational Activities/Hobbies:  unknown  Pre-hospital Living Environment: The patient lives with her spouse in a 1 level home with 1 STE in [place].  The patient was completely I prior and does not [place] equipment.  Her spouse is able to assist the patient at home after rehab.  The patient has a shower bench.  + driving.    Review of systems:  Pertinent positives are:  diarrhea after every meal, tingling in the feet bilaterally, cold feet  A 10 point review of systems was asked and is otherwise negative.    Medications:   Medications:   Medications reconciled by me:  Acetaminophen 975 milligrams oral every eight hours  Xanax IR 2 milligrams oral every HS  vitamin D3 1000 units oral daily  Vitamin B12 1000 mcg intramuscularly every 48 hours  Cymbalta 60 milligrams oral daily  Lovenox 40 milligrams subcutaneously every evening  Zetia 10 milligrams oral daily  Ferrous sulfate 325 milligrams oral daily  Folic acid 1 milligram oral daily  Lantus 5 units subcutaneously every  evening  Vimpat 150 milligrams IV BID  Synthroid 50 micrograms oral every morning  Lidoderm Topical one each topical every 24 hours  Cozaar 25 milligrams oral daily  Protonix 40 milligrams oral every 24 hours  Crestor 40 milligrams oral every evening  Vitamin B-1 25 milligrams oral daily  Vitamin E 400 units oral daily  Zonegran 200 milligrams oral BID  Marinol 2.5 mg oral every 24 hours  amphetamine dextroamphetamine 20 mg oral BID    Physical Examination:   There were no vitals taken for this visit.  There is no height or weight on file to calculate BMI.  General: age appropriate, NAD, well nourished  HEENT: Head NC/AT, neck supple, no tenderness, sclera non-icteric, mouth mucosa pink and moist  Heart/Thorax: RRR, no m/r/g  Lungs: CTAB, no wheezing, rhonci, or rales  Abdomen: soft, non-tender, bowel sounds present  MSK: Skin intact  Neurological: AAOx3, EOMI, PERRLA, CNII-XII grossly intact. 3/3 word recall, reflexes +1  and symmetric to bilateral achilles, but unable to elicit patellar reflexes. Down going plantar response on Babinski testing. Impaired sensation to feet bilaterally    Manual muscle strength testing:  Muscle group Left Right   Shoulder abductors 5 5   Elbow flexors 5 5   Elbow extensors 5 5   Finger flexors 5 5   Hip flexors 4 4   Knee flexors 4 4   Knee extensors 4 4   Ankle dorsiflexors 4 4   Ankle plantarflexors 4 4   EHL NT NT      Lymphatics: no lymphadenopathy  Psych: pleasant and cooperative    INPATIENT REHABILITATION FACILITY - PATIENT ASSESSMENT INSTRUMENT QUALITY INDICATORS       Section C.  Cognitive Patterns.    C0100. Should Brief Interview for Mental Status (C0200-C0500) be conducted? (3-day assessment period) Attempt to conduct interview with all patients.      0. No (patient is rarely/never understood) Skip to C0900. Memory/Recall Ability   1. Yes Continue to C0200. Repetition of Three Words   Yes     Brief Interview for Mental Status (BIMS)    C0200. Repetition of Three Words       Ask patient: I am going to say three words for you to remember. Please repeat the words after I have said all three. The words are: sock, blue and bed. Now tell me the three words.    Number of words repeated by patient after first attempt:   3. Three   2. Two   1. One   0. None     Three    After the patient's first attempt say: I will repeat each of the three words with a cue and ask you about them later: sock, something to wear; blue, a color; bed, a piece of furniture. You may repeat the words up to two more times             Brief Interview for Mental Status (BIMS) - Continued    C0300. Temporal Orientation: Year, Month, Day      A. Ask patient: Please tell me what year it is right now. Patient's answer is:   3. Correct   2. Missed by 1 year   1. Missed by 2 to 5 years   0. Missed by more than 5 years or no answer     Correct       B. Ask patient: What month are we in right now? Patient's answer is:   2. Accurate within 5 days   1. Missed by 6 days to 1 month  0. Missed by more than 1 month or no answer     Accurate within 5 days       C. Ask patient: What day of the week is today? Patient's answer is:   1. Correct   0. Incorrect or no answer     Correct     C0400. Recall    Ask patient: Let's go back to the first question. What were those three words that I asked you to repeat? If unable to remember a word, give cue (i.e., something to wear; a color; a piece of furniture) for that word.     A. Recalls sock?   2. Yes, no cue required  1. Yes, after cueing ("something to wear")  0. No, could not recall     Yes, no cue required     B. Recalls blue?   2. Yes, no cue required  1. Yes, after cueing ("a color")  0. No, could not recall     Yes, no cue required     C. Recalls bed?  2. Yes, no cue required   1. Yes, after cueing ("a piece of furniture")   0. No, could not recall     Yes, no cue required   C0500. BIMS Summary Score.      Select "Yes", if the patient was unable to complete the  interview.  Select "No", if the patient was able to complete the interview.   No     C0600. Should the Staff Assessment for Mental Status [phone]) be Conducted?      0. No (patient was able to complete Brief Interview for Mental Status)   1. Yes (patient was unable to complete Brief Interview for Mental Status) Continue to C0900. Memory/Recall Ability     No (patient was able to complete Brief Interview for Mental Status)       Staff Assessment for Mental Status.    Do not conduct if Brief Interview for Mental Status (C0200-C0500) was completed.    C0900. Memory/Recall Ability.    Check all that the patient was normally able to recall    A. Current season    B. Location of own room    C. Staff names and faces    E. That he or she is in a hospital/hospital unit    Z. None of the above were recalled     That he or she is in a hospital/hospital unit       Labs:   No results for input(s): GLUCOSEWHOLE in the last 24 hours.                       Results     ** No results found for the last 24 hours. **               Assessment/Plan: There is no problem list on file for this patient.    The following medical/functional conditions and comorbidities may be complicating factors in their comprehensive rehabilitation program.    #Monophasic demyelinating myeloencephalitis with deficits in mobility and ADLs: OOB, fall precautions  -Imaging significant for transverse myelitis, with additional signal abnormalities in the internal capsule, thalami, and now bifrontal white matter (Oct 16, 2019) s/p PLEX x 5 completed on 3/3 with improvement noted per documentation  -B-1, B12, folic acid, Vitamin E  -Will recheck vitamin and mineral blood levels to assess need for supplementation moving forward  Neurology consulted to follow    Workup at [facility] included:  Copper, heavy metal panels, Folate, B-12, homocysteine, Ceruloplasmin, Methymalnoic acid, Serum Zn, Selenium, Vit A, Vit K =all WNL  Vit E low at 6.7  CSF IgG index, CSF  oligoclonal bands=all WNL  CSF myelin basic protein high (167)  SSA, SSB, dsDNA, serum IgG, IgM, IgA=all WNL  Albumin index high (14)  West Nile IgM (-), West Nile IgG (+)  Borellia 41kd Ab IgG (+), 39 IgM (+); on repeat Feb 20 only band 41 IgG (+)  VDRL non-reactive, CSF fungal, AFB tests (-)  SPEP, UPEP =all WNL  Lumbar puncture Feb 20 CSF glucose 89->91, CSF protein 70->52, Lymph 88%, Mono 14%  Inflammatory markers Feb 16-> ESR >120, CRP 46  [facility] paraneoplastic panel pending    #Neuro (Hx seizure dz, s/p craniotomy for meningioma resection): Continue Vimpat, Zonegran  EEG Feb 22 w/o evidence of neuropathy. Overall; clinically a  monophasic demyelinating disease  Outpatient Neurology Appointment scheduled at [facility] with Dr. 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, GERD, esophageal stricture, IBS): Monitor stool output  Marinol for appetite stimulant  PO low fiber diet  Miralax PRN for constipation  Substituted individually ordered vitamins (Vit B-1, folic acid, iron) with Nephrovite to lessen pill burden and GI upset    #Hypercholesterolemia: Zetia    #Pain Management (Hx lumbar radiculopathy, fibromyalgia): Tylenol, Cymbalta, and Lidoderm patches  Gabapentin    #Anemia: Monitor CBC  Continue iron and Vitamin C supplement    #DMII: Hypoglycemic on admission, SSI coverage only for now  Medicine consulted to follow    #Hypothyroidism: Synthroid    #DVT Prophylaxis: Use Lovenox    #Bowel/Bladder Management: PVRs to rule out urinary retention. Use Senna and Colace    #Skin Care: Keep skin clean and dry    #Psych(ADHD): Psychology services for evaluation and treatment as needed for adjustment/coping   Continue Adderall    #FEN/GI: Regular diet. Nutrition consult  Protonix for GI prophylaxis    #Dispo: TBD pending rehab progress    The patient will be admitted for inpatient comprehensive interdisciplinary rehabilitation program (CIR) to  address the impairments and medical conditions listed above while assessing equipment needs and compensatory strategies, with services that include PT/OT (60-120 mins/day 5-6xs per week), speech service if ordered (60-120 mins/day 5-6xs per week), 24hr/day rehab nursing, and case management under the direction of a physiatrist.    [x]  Requires an intensive inpatient rehabilitation program with multidisciplinary therapies, rehab nursing, and close physician management.    [x]  Is at risk for the following complications:  Injurious falls, Pneumonia, Urinary tract infection, Venous thromboembolic disease and Pressure sores        Individualized Plan of Care:    - REHAB: Begin comprehensive and intensive inpatient rehab program, including:  Physical therapy 60-120 min daily, 5-6 times per week  Occupational therapy  60-120 min daily, 5-6 times per week  Speech therapy  60-120 min daily, 5-6 times per week  Therapeutic recreation  Case management  Rehabilitation nursing    Will work in an interdisciplinary manner to address the following impairments and issues:   Mobility, ADLs, Impaired strength, Impaired ROM, Impaired endurance, Adherence to precautions, Caregiver training, Medication management and Impaired balance    Requires 24h rehabilitation nursing to address:  Bladder care, Bowel care, Glucose control, Hydration needs, Medication teaching, Nutrition, Positioning and Safety    Anticipate a discharge to:  Home with assistance    Estimated length of stay: 1-2 weeks      Goals for discharge: Modified Independent  Communication Use compensatory strategies to express needs and wants     Swallow Tolerate least restrictive oral diet without signs or symptoms of aspiration     Cognition Use compensatory strategies appropriately to compensate       A CIR program is medically necessary to achieve medical and functional goals and no less intensive setting is appropriate as an alternative. The patient requires frequent  physician visits, 24 hour rehab nursing care, intensive therapy services and a coordinated rehab program.     Rehab potential: excellent    Prognosis: excellent    Potential limitations:Challenging home environment      Review of Pre-admission assessment  [x]  I have reviewed the nurse liaisons pre-admission assessment in [employer].   I do not note any significant changes at this time and agree  with patient's appropriateness for intensive inpatient rehab program.    A complete drug regimen review was completed: Yes      Was I contacted and action was taken by midnight of the next calendar day once issue was identified?: N/A    Person who contacted me:    What was the issue?:    Action taken:    What was the time the issue was identified?:    What was the time the issue was acted upon?:    Signed by: Clover Mealy DO    [facility]

## 2019-11-07 DIAGNOSIS — R197 Diarrhea, unspecified: Secondary | ICD-10-CM

## 2019-11-07 DIAGNOSIS — G378 Other specified demyelinating diseases of central nervous system: Secondary | ICD-10-CM

## 2019-11-07 LAB — COMPREHENSIVE METABOLIC PANEL
ALT: 18 U/L (ref 0–55)
AST (SGOT): 17 U/L (ref 5–34)
Albumin/Globulin Ratio: 2.1 (ref 0.9–2.2)
Albumin: 4.6 g/dL (ref 3.5–5.0)
Alkaline Phosphatase: 74 U/L (ref 37–106)
Anion Gap: 9 (ref 5.0–15.0)
BUN: 10 mg/dL (ref 7–19)
Bilirubin, Total: 0.2 mg/dL (ref 0.2–1.2)
CO2: 25 mEq/L (ref 22–29)
Calcium: 9.7 mg/dL (ref 8.5–10.5)
Chloride: 108 mEq/L (ref 100–111)
Creatinine: 0.8 mg/dL (ref 0.6–1.0)
Globulin: 2.2 g/dL (ref 2.0–3.6)
Glucose: 100 mg/dL (ref 70–100)
Potassium: 4.1 mEq/L (ref 3.5–5.1)
Protein, Total: 6.8 g/dL (ref 6.0–8.3)
Sodium: 142 mEq/L (ref 136–145)

## 2019-11-07 LAB — HEMOGLOBIN A1C
Average Estimated Glucose: 96.8 mg/dL
Hemoglobin A1C: 5 % (ref 4.6–5.9)

## 2019-11-07 LAB — CBC
Absolute NRBC: 0 10*3/uL (ref 0.00–0.00)
Hematocrit: 28.2 % — ABNORMAL LOW (ref 34.7–43.7)
Hgb: 9 g/dL — ABNORMAL LOW (ref 11.4–14.8)
MCH: 31 pg (ref 25.1–33.5)
MCHC: 31.9 g/dL (ref 31.5–35.8)
MCV: 97.2 fL — ABNORMAL HIGH (ref 78.0–96.0)
MPV: 9.2 fL (ref 8.9–12.5)
Nucleated RBC: 0 /100 WBC (ref 0.0–0.0)
Platelets: 391 10*3/uL — ABNORMAL HIGH (ref 142–346)
RBC: 2.9 10*6/uL — ABNORMAL LOW (ref 3.90–5.10)
RDW: 16 % — ABNORMAL HIGH (ref 11–15)
WBC: 3.6 10*3/uL (ref 3.10–9.50)

## 2019-11-07 LAB — MRSA CULTURE
Culture MRSA Surveillance: NEGATIVE
Culture MRSA Surveillance: NEGATIVE

## 2019-11-07 LAB — GLUCOSE WHOLE BLOOD - POCT
Whole Blood Glucose POCT: 102 mg/dL — ABNORMAL HIGH (ref 70–100)
Whole Blood Glucose POCT: 79 mg/dL (ref 70–100)
Whole Blood Glucose POCT: 99 mg/dL (ref 70–100)
Whole Blood Glucose POCT: 97 mg/dL (ref 70–100)

## 2019-11-07 LAB — GFR: EGFR: >60

## 2019-11-07 MED ORDER — VITAMIN B-12 500 MCG PO TABS
500.0000 ug | ORAL_TABLET | Freq: Every day | ORAL | Status: DC
Start: 2019-11-08 — End: 2019-11-12
  Administered 2019-11-08 – 2019-11-12 (×5): 500 ug via ORAL
  Filled 2019-11-07 (×5): qty 1

## 2019-11-07 MED ORDER — CHOLESTYRAMINE 4 G PO PACK
1.00 | PACK | Freq: Two times a day (BID) | ORAL | Status: DC
Start: 2019-11-07 — End: 2019-11-09
  Administered 2019-11-08: 10:00:00 1 via ORAL
  Filled 2019-11-07 (×5): qty 1

## 2019-11-07 MED ORDER — THIAMINE (VITAMIN B1) 100 MG PO TABS (WRAP)
100.0000 mg | ORAL_TABLET | Freq: Every day | ORAL | Status: DC
Start: 2019-11-08 — End: 2019-11-16
  Administered 2019-11-08 – 2019-11-16 (×9): 100 mg via ORAL
  Filled 2019-11-07 (×9): qty 1

## 2019-11-07 MED ORDER — SIMETHICONE 80 MG PO CHEW
80.00 mg | CHEWABLE_TABLET | Freq: Three times a day (TID) | ORAL | Status: AC
Start: 2019-11-07 — End: 2019-11-10
  Administered 2019-11-07 – 2019-11-10 (×9): 80 mg via ORAL
  Filled 2019-11-07 (×9): qty 1

## 2019-11-07 MED ORDER — NEPHRO-VITE 0.8 MG PO TABS
1.0000 | ORAL_TABLET | Freq: Every day | ORAL | Status: DC
Start: 2019-11-08 — End: 2019-11-12
  Administered 2019-11-08 – 2019-11-12 (×5): 1 via ORAL
  Filled 2019-11-07 (×5): qty 1

## 2019-11-07 MED ORDER — SIMETHICONE 80 MG PO CHEW
80.00 mg | CHEWABLE_TABLET | Freq: Four times a day (QID) | ORAL | Status: DC | PRN
Start: 2019-11-07 — End: 2019-11-07
  Administered 2019-11-07: 12:00:00 80 mg via ORAL
  Filled 2019-11-07: qty 1

## 2019-11-07 MED ORDER — PANTOPRAZOLE SODIUM 40 MG PO TBEC
40.00 mg | DELAYED_RELEASE_TABLET | Freq: Every day | ORAL | Status: DC
Start: 2019-11-07 — End: 2019-11-16
  Administered 2019-11-07 – 2019-11-16 (×10): 40 mg via ORAL
  Filled 2019-11-07 (×10): qty 1

## 2019-11-07 NOTE — Consults (Signed)
Date Time: 11/07/19 4:09 PM  Patient Name: Christy Mcintosh  Requesting Physician: [name]*  Consulting Physician: Theresia Lo, MD    Primary Care Physician: Patsy Lager, MD    Reason for Consultation: Diarrhea      Assessment:     Patient Active Problem List    Diagnosis Date Noted    Peripheral autoimmune demyelinating disease 11/06/2019         Recommendations:     Subacute diarrhea sp resection   - add Cholestyramine BID .  - Low fiber diet .    History of DM type 2 , A1C 5   - BS 79-98 , will [place]   ISS .    HTN   - Losartan 25 mg .    Hyperlipidemia - Crestor 40 mg .    History of meningioma status post resection  -On Vimpat 150 mg twice daily.    Patient takes Adderall 20 mg and Cymbalta 60 mg daily.    Peripheral autoimmune demyelinating disease  - cw PT , OT     Patient takes multiple supplements including thiamine, vitamin D, vitamin E .    [name]*, thank you for this consultation.  We will follow the patient with you during this hospitalization.  Please contact me with any questions or issues.          History of Presenting Illness:   Christy Mcintosh is a 59 y.o. female with past medical history of chronic anemia, diabetic mellitus type II, seizure disorder history of lumbar radiculopathy history of meningioma status post craniotomy and resection on Vimpat history of hypothyroidism who was initially admitted to the hospital for colonic inertia , she was taken to the OR 09/12/2019 status post total abdominal colectomy and ileorectal anastomosis, postsurgical course was complicated by subacute progressive myelopathy later deemed to be in monophasic autoimmune demyelinating pain myeloma encephalitis marked by bilateral lower extremity paraparesis.  This happened 3 to 4 weeks after abdominal surgery while in ICU after contracting severe UTI.  Patient transferred to [facility] on 10/11/2019 for new diagnosis of subacute progressive myelopathy patient completed 5/5 PLEX , EMG  completed after on 10/14/2019 without evidence of neuropathy.  Patient clinically improved and she was transferred to acute rehab on 11/06/2019    Patient reports 3-4 loose bowel movement since resection of her colon.  She denies any abdominal cramp , denies any fever or chills, she denies any melena hematemesis hematochezia      Past Medical History:     Past Medical History:   Diagnosis Date    Asthma     Constipation     Degenerative disc disease, lumbar     Diabetes     Esophageal stricture     Fibromyalgia     GERD (gastroesophageal reflux disease)     Hypertension     Hypothyroidism     Seizures        Available old records reviewed, including: EPIC      Past Surgical History:     Past Surgical History:   Procedure Laterality Date    APPENDECTOMY      BILATERAL SALPINGOOPHORECTOMY      BLADDER SUSPENSION      breast biopsy      CATARACT EXTRACTION      CRANIOTOMY FOR EXCISION OF GLOMUS TUMOR      Meningioma    HYSTERECTOMY      knee meniscal repairs Bilateral     RHINOPLASTY      ROTATOR CUFF  REPAIR         Family History:   No family history on file.    Social History:     Social History     Tobacco Use   Smoking Status Never Smoker   Smokeless Tobacco Never Used     Social History     Substance and Sexual Activity   Alcohol Use Never     Social History     Substance and Sexual Activity   Drug Use Never       Allergies:     Allergies   Allergen Reactions    Adhesive [Wound Dressing Adhesive]     Ambien [Zolpidem]     Levaquin [Levofloxacin]     Sulfa Antibiotics     Tylox [Oxycodone-Acetaminophen]     Vancomycin     Vicodin [Hydrocodone-Acetaminophen]        Medications:     No medications prior to admission.       Current Facility-Administered Medications   Medication Dose Route Frequency    acetaminophen  1,000 mg Oral TID    ALPRAZolam  2 mg Oral QHS    amphetamine-dextroamphetamine  20 mg Oral BID    [START ON 11/08/2019] b complex-vitamin c-folic acid  1 tablet Oral Daily     dronabinol  2.5 mg Oral Daily    DULoxetine  60 mg Oral Daily    enoxaparin  40 mg Subcutaneous Daily    ezetimibe  10 mg Oral QHS    insulin lispro  1-3 Units Subcutaneous QHS    insulin lispro  1-5 Units Subcutaneous TID AC    lacosamide  150 mg Oral BID    levothyroxine  50 mcg Oral Daily at 0600    lidocaine  1 patch Transdermal Q24H    losartan  25 mg Oral Daily    pantoprazole  40 mg Oral Daily at 0600    rosuvastatin  40 mg Oral QHS    [START ON 11/08/2019] thiamine  100 mg Oral Daily    [START ON 11/08/2019] vitamin B-12  500 mcg Oral Daily    vitamin D (cholecalciferol)  25 mcg Oral Daily    vitamin E  400 Units Oral Daily    zonisamide  200 mg Oral BID            Review of Systems:   All other systems were reviewed and are negative except     Physical Exam:     Patient Vitals for the past 24 hrs:   BP Temp Temp src Pulse Resp SpO2 Height Weight   11/07/19 1315 128/86 -- -- 82 -- 100 % -- --   11/07/19 1146 (!) 134/91 -- -- 80 -- -- -- --   11/07/19 1124 (!) 139/96 -- -- 86 -- 100 % -- --   11/07/19 0924 111/79 -- -- -- -- -- -- --   11/07/19 0513 121/78 98.6 F (37 C) Oral 80 16 100 % -- --   11/06/19 1803 (!) 145/91 98.4 F (36.9 C) Oral 82 20 100 % 1.6 m (5\' 3" ) 63.6 kg (140 lb 3.4 oz)     Body mass index is 24.84 kg/m.    Intake/Output Summary (Last 24 hours) at 11/07/2019 1609  Last data filed at 11/07/2019 0000  Gross per 24 hour   Intake 640 ml   Output 0 ml   Net 640 ml       General: awake, alert, oriented x 3; no acute distress.  HEENT: perrla,  eomi, sclera anicteric  oropharynx clear without lesions, mucous membranes moist  Neck: supple, no lymphadenopathy, no JVD, no carotid bruits  Cardiovascular: regular rate and rhythm, no murmurs, rubs or gallops  Lungs: clear to auscultation bilaterally, without wheezing, rhonchi, or rales  Abdomen: soft, non-tender, non-distended; no palpable masses, no hepatosplenomegaly, normoactive bowel sounds, no rebound or guarding  Extremities: no  clubbing, cyanosis, or edema  Neuro: A+O x 3, cranial nerves grossly intact, strength 5/5 in upper and lower extremities, sensation intact,   Skin: no rashes or lesions noted        Labs:     Recent Labs     11/07/19  0700   WBC 3.60   Hgb 9.0*   Hematocrit 28.2*   Platelets 391*       Recent Labs     11/07/19  0700   Sodium 142   Potassium 4.1   Chloride 108   CO2 25   BUN 10   Creatinine 0.8   Glucose 100   Calcium 9.7       Recent Labs     11/07/19  0700   AST (SGOT) 17   ALT 18   Alkaline Phosphatase 74   Protein, Total 6.8   Albumin 4.6       No results for input(s): PTT, PT, INR in the last 72 hours.            Imaging personally reviewed, including:      Signed by: Theresia Lo, MD    cc: Alvina Filbert, MD

## 2019-11-07 NOTE — Rehab Progress Note (Medilinks) (Signed)
Christy Mcintosh  MRN: [id number]  Account: [id number]  Session Start: 11/07/2019 12:00:00 AM  Session Stop: 11/07/2019 12:00:00 AM    Total Treatment Minutes:  Minutes    Rehabilitation Nursing  Inpatient Rehabilitation Shift Assessment    Rehab Diagnosis: Monophasic Demyelinating Myeloencephalitis  Demographics:            Age: 58Y            Gender: Female  Primary Language: English    Date of Onset:  09/12/19  Date of Admission: 11/06/2019 5:56:00 PM    Rehabilitation Precautions Restrictions:   Fall Precaution and Skin Precaution    Patient Report: "Nice to see you"  Patient/Caregiver Goals:  "I want to get stronger and go back home"    Wounds/Incisions:       Old scars to the lower back and r thigh .    Medication Review: No clinically significant medication issues identified this  shift.    Bowel and Bladder Output:                       Bladder (# only)             Bowel (# only)  Number of Episodes  Continent            4                            2  Incontinent          0                            0    Additional Education Provided:    Education Provided: Safety issues and interventions. Fall protocol. Medication.  Name and dosage. Purpose. Administration. Side Effects.       Audience: Patient.       Mode: Explanation.       Response: Verbalized understanding.  Needs reinforcement.    TEAM CARE PLAN  Identified problems from team documentation:  Problem: Impaired Pain Management  Pain Mgmt: Primary Team Goal: Patient will verbalize pain less than or equal to  2 out of 10 with the use of medication and positioning techniques/    Problem: Impaired Self-care Mgmt/ADL/IADL  Self Care: Primary Team Goal: patient will require no more than minimum  assistance for bathing and dressing activities during hospitalization/    Problem: Impaired Skin/Wound Mgmt  Skin Wound: Primary Team Goal: Patient  will independently perform weight shifts  to prevent skin breakdown/    Problem: Safety Risk and Restraint  Safety:  Primary Team Goal: Patient will follow safety precautions and call for  assistance 100% of the time during hospitalization/    Please review Integrated Patient View Care Plan Flowsheet for Team identified  Problems, Interventions, and Goals.    Signed by: Domenica Reamer, RN 11/07/2019 6:24:00 PM

## 2019-11-07 NOTE — Consults (Signed)
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 for rehabilitation she has a prior history of seizures after craniotomy, for possibly meningioma she was admitted, to [facility], with abdominal symptoms, eventually underwent a colectomy, with ileorectal anastomosis.  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, along with findings suggestive of transverse myelitis she was treated with plasma exchange, currently she is on the rehab unit and neurology consultation is requested    Past Medical History:     Past Medical History:   Diagnosis Date    Asthma     Constipation     Degenerative disc disease, lumbar     Diabetes     Esophageal stricture     Fibromyalgia     GERD (gastroesophageal reflux disease)     Hypertension     Hypothyroidism     Seizures        Meds:   Tylenol alprazolam Adderall Nephro-Vite Questran Marinol Cymbalta Lovenox Zetia insulin lacosamide Synthroid Lidoderm patches Cozaar Protonix Crestor Mylicon vitamin B1 B12 D Zonegran    Allergies   Allergen Reactions    Adhesive [Wound Dressing Adhesive]     Ambien [Zolpidem]     Levaquin [Levofloxacin]      Sulfa Antibiotics     Tylox [Oxycodone-Acetaminophen]     Vancomycin     Vicodin [Hydrocodone-Acetaminophen]        Social & Family History:     Social History     Socioeconomic History    Marital status: Married     Spouse name: Not on file    Number of children: Not on file    Years of education: Not on file    Highest education level: Not on file   Occupational History    Not on file   Tobacco Use    Smoking status: Never Smoker    Smokeless tobacco: Never Used   Substance and Sexual Activity    Alcohol use: Never    Drug use: Never    Sexual activity: Yes     Partners: Male   Other Topics Concern    Not on file   Social History Narrative    Not on file     Social Determinants of Health     Financial Resource Strain:     Difficulty of Paying Living Expenses:    Food Insecurity:     Worried About Radiation protection practitioner of Food in the Last Year:     Barista in the Last Year:    Transportation Needs:     Lack of Transportation (Medical):     Lack of Transportation (Non-Medical):    Physical Activity:     Days  of Exercise per Week:     Minutes of Exercise per Session:    Stress:     Feeling of Stress :    Social Connections:     Frequency of Communication with Friends and Family:     Frequency of Social Gatherings with Friends and Family:     Attends Religious Services:     Active Member of Clubs or Organizations:     Attends Engineer, structural:     Marital Status:    Intimate Partner Violence:     Fear of Current or Ex-Partner:     Emotionally Abused:     Physically Abused:     Sexually Abused:      No family history on file.        CODE STATUS: Full code  Review of Systems:   No headache, eye, ear nose, throat problems; no coughing or wheezing or shortness of breath, No chest pain or orthopnea, no abdominal pain, nausea or vomiting, No pain in the body or extremities, no psychiatric, neurological, endocrine, hematological or cardiac complaints except as noted above.        Physical Exam:   Blood pressure 132/85, pulse 76, temperature 98.1 F (36.7 C), temperature source Oral, resp. rate 20, height 1.6 m (5\' 3" ), weight 63.6 kg (140 lb 3.4 oz), SpO2 100 %.    HEENT: Normocephalic.no carotid bruits  Lungs:  CTA bil  Abd Soft   Cardiac:  S1,S2, normal rate and rhythm  Neck: supple, no cartoid bruits  Extremities: no edema  Skin: no rashes seen in exposed areas     Neuro:  Level of consciousness:  Alert and appropriate  Oriented:  X 3  Cognition:  Intact naming, recognition, concentration and following complex commands  Cranial Nerves:  II-XII intact  Strength:  No upper extremity drift, 5/5 strength x 4 extremities  Coordination:  Intact FTN testing  Reflexes:  +2 throughout, down going toes bil  Sensation: Intact x 4 extremities to LT, temp,  Labs:     Recent Labs   Lab 11/07/19  0700   Glucose 100   BUN 10   Creatinine 0.8   Calcium 9.7   Sodium 142   Potassium 4.1   Chloride 108   CO2 25   Albumin 4.6   AST (SGOT) 17   ALT 18   Bilirubin, Total 0.2   Alkaline Phosphatase 74     Recent Labs   Lab 11/07/19  0700   WBC 3.60   Hgb 9.0*   Hematocrit 28.2*   MCV 97.2*   MCH 31.0   MCHC 31.9   Platelets 391*         No results for input(s): PTT, PT, INR in the last 72 hours.       Radiology Results (24 Hour)     Procedure Component Value Units Date/Time    US Venous Low Extrem Duplx Dopp Comp Bilat [[id number]] Collected: 11/06/19 2221    Order Status: Completed Updated: 11/06/19 2224    Narrative:      BILATERAL LOWER EXTREMITY VENOUS DUPLEX ULTRASOUND    DATE OF SERVICE: 11/06/2019 9:56 PM.    HISTORY: 59 years-old Female with bilateral leg pain. Concern for  DVT. Venous duplex evaluation is requested.    COMPARISON: None.    TECHNIQUE: Bilateral lower extremity venous duplex ultrasound was  performed utilizing grayscale, color and spectral Doppler, transverse  compression, and augmentation techniques.    FINDINGS: The bilateral femoropopliteal and deep  calf veins are  patent with  normal coaptation and no evidence of thrombosis. The  bilateral saphenofemoral junctions are patent. Pulsatile venous flow is  noted at the iliofemoral junctions bilaterally which may correlate with  fluid overload and/or right heart strain.      Impression:        1. Negative for DVT of the bilateral lower extremities.  2. Pulsatile venous flow at the iliofemoral junctions may correlate with  fluid overload and/or right heart strain.    Venu Vadlamudi   11/06/2019 10:22 PM           All recent brain and spine imaging (MRI, CT) personally reviewed.    Chart reviewed    Case discussed with:    This note was generated by the Epic EMR system/Speech recognition and may contain inherent errors or omissions not intended by the user. Grammatical errors, random word insertions, deletions and pronoun errors  are occasional consequences of this technology due to software limitations.   Not all errors are caught or corrected. If there are questions or concerns about the content of this note or information contained within the body of this dictation they should be addressed directly with the author for clarification.    Signed by: Cathe Mons, MD  Spectralink: [phone]      Answering Service: [phone]

## 2019-11-07 NOTE — Rehab Evaluation (Medilinks) (Signed)
Christy Mcintosh  MRN: [id number]  Account: [id number]  Session Start: 11/07/2019 12:00:00 AM  Session Stop: 11/07/2019 12:00:00 AM    Total Treatment Minutes:  Minutes    Case Management  Inpatient Rehabilitation Initial Assessment    Rehab Diagnosis: Monophasic Demyelinating Myeloencephalitis  Demographics:            Age: 60Y            Gender: Female    Past Medical History: Interstitial Cystitis  DM  HTN  Hypothyroidism  Asthma  GERD/IBS  Seizures D/O  Fibromyalgia  DDD Lumbar Spine  Overactive Bladder  Chronic Constipation  GI bleed  Esophageal Stricture    PSH:  Appendectomy  Bladder sling  Hysterectomy  BS and ovarian removal  Craniotomy secondary to meningioma  Breast Bx's  Bilateral knee meniscus surgeries  Cataract Surgeries  Rhinoplasty  Rotator cuff surgery  History of Present Illness: Christy Mcintosh is a 59 year old female originally  admitted to [facility] in January 2021 with colonic  inertia.  She was taken to the OR on 09/12/19 for Colonic Inertia, S/P Total  abdominal Colectomy with ileorectal anastamosis. Post op complicated by ilieus  and was transferred to [facility] on 10/11/19 for further treatment of post op needs  and new dx of subacute progressive myelopathy requiring Neurology consult.  The  patient was also receiving TPN, now discontinued and the patient is tolerating  diet well with improved intake. On Marinol-appetite stimulant. Regular diet with  Ensure BID.+BM.  Regarding monophasic Autoimmune Demyelinating  Myeloencephalitis-presented with subacute progressive myelopathy on 10/03/19  with BLE paraparesis and hyporeflexia.  Imaging significant for transverse  myelitis with patchy, non diffusing T2 abnormalities involving the corticospinal  tracts.  Completed 5/5 PLEX started on 09/25/19 with improvement noted.  EMG  completed on 10/14/19 without s/o of neuropathy.  PM+R consult completed as well  as neurology consult.  The patient was also followed for the following  acute  medical conditions: UTI/Cystitis- Started on Nitrofurontinin SR 100 mg BID  Started on March 10th, myeloradicular lumbar back pain- continues on Tylenol,  Cymbalta, Robaxin, and Lidoderm patches, Anemia-Continue Iron and monitor H+H  6.  MRSA Precautions-Resolved, Testing negative from 10/23/19, pt removed from  serial precautions. Hx of DM, Continue Insulin as ordered. Seizure Disorder, Hx  of Craniotomy for Meningioma resection.  Continues on PO Vimpat.   Hypothyroidism-Continue on PO Synthroid.  The patient is alert and oriented x4,  pleasant and cooperative with care needs. Tolerating regular diet well, + BM.  Paraparesis improving and the patient is working well with therapy services with  progress made.  The patient has been working well with therapy services to  include PT and OT to address functional mobility, self care deficit, and pain.  The patient would benefit from an intensive level of rehab along with close  medical management of multiple medical conditions and rehab MD to drive the  rehab process.  The patient is medically stable for IRF level of care.   Date of Onset: 09/12/19   Date of Admission: 11/06/2019 5:56:00 PM    Premorbid Functional Level: Patient reported Independent  Understanding of Current Condition: good  Patient/Caregiver Goals:  Patient's functional goals: "I want to get stronger  and go back home"  Home Environment: Patient lives in a home environment. Patient lives with  Christy Mcintosh (pt's spouse) who is (are) able to assist patient at discharge.  Primary Language: English    Demographics: (Source of  HX, Pre-Hosp, Marital, Income, Vocation, [place])  Source of History: Patient.  Pre-Hospital Living  Environment: Home.  Marital Status: Married.  Income Source:   Special educational needs teacher Income (SSI).    Vocational Status: Retired for Disability.  Family Contact Information:  Primary Contact: Christy Mcintosh  Relationship: Spouse  Address: Same as pt  Primary Number: [phone]  (c)  Secondary Number:  Family or Representative Notification: Patient wants family/chosen  representative to be notified of admission to the hospital. Case Manager  contacted individual named above by phone  POA/Guardian Information:  Patient does not have a guardian.  Care Act Designee: Christy Mcintosh  Address: Same as pt  Phone number: [phone] (c)  Military Status: The patient has never been in the [employer].  Financial Concerns: None    Special Needs: None.   CURRENTLY EXPERIENCING FEVER AND/ OR SYMPTONS OF ACUTE RESPIRATORY ILLNESSES?  NO    TRAVEL HISTORY FROM AFFECTED GEOGRAPHIC AREAS WITHIN 14 DAYS OF SYMPTOM ONSET?  NO    CLOSE CONTACT WITH LAB CONFIRMED COVID-19 PERSON? NO    RESIDE IN A NURSING HOME OR LONG-TERM CARE FACILITY OR ASSISTED LIVING FACILITY?   NO    WAS THE PATIENT EVER TESTED FOR COVID-19?  YES    TEST RESULTS/DATE? 11/05/2019 -; NEGATIVE  Observed Behaviors:  Cooperative.  Psychosocial History:   None.  Adjustment to Present Illness:  Patient is coping adequately.   Patient is accepting limitations adequately.   Patient's expectations are realistic.   Patient is motivated.  Patient Perceived Primary Stressors: None    Providers: TBD    Primary Care Physician Notificaton: Patient does not want primary care physician  to be notified of admission to the hospital.    Home Care/Long Term Care Policy: None.    Interdisciplinary Educational Needs and Learning Preferences:  Education not  assessed/provided this session.    Rehab Potential: Able to participate in an intensive inpatient interdisciplinary  rehabilitation program, Good family/social support, Living in the community  premorbidly, Motivated  Barriers to Progress/Discharge: No potential barriers to progress.    Case Management Psychosocial Assessment:    CM met w/pt to introduce self, role, and the AR process. CM reviewed pt's room  White Board and Pt Binder. Pt shared that she resides in [place] w/ Christy Mcintosh  (pt's spouse) and they  live in a one (1) level house w/ no steps to the front  door. Pt's spouse will be pt's PCG at d/c. Pt said that she plans on flying back  to [place] upon d/c.  Prior to admission pt was Independent and driving. Pt receives SSI. Pt does not  own any DME in the home. Pt's goal is "to get to walking to get home."  Initial Team Rounds to be held on Tuesday, March 23 to discuss pt's functional  status, ELOS, barriers to d/c, and d/c recommendations.  CM will continue to follow.    Family Meeting: Family meeting was not offered at this time.  Community Resources: None    Medicare Important Message: The Medicare Important Message letter was issued.    Care Plan  Identified problems from team documentation:  Problem: Impaired Mobility  Mobility: Primary Team Goal: Pt will perform all mobility with ModI with LRAD  for safe D/C home./Active    Problem: Impaired Pain Management  Pain Mgmt: Primary Team Goal: Patient will verbalize pain less than or equal to  2 out of 10 with the use of medication and positioning techniques/    Problem: Impaired  Self-care Mgmt/ADL/IADL  Self Care: Primary Team Goal: patient will require no more than minimum  assistance for bathing and dressing activities during hospitalization/    Problem: Impaired Skin/Wound Mgmt  Skin Wound: Primary Team Goal: Patient  will independently perform weight shifts  to prevent skin breakdown/    Problem: Safety Risk and Restraint  Safety: Primary Team Goal: Patient will follow safety precautions and call for  assistance 100% of the time during hospitalization/    Identified problems from this assessment:     No problems identified at this time.    Please review Integrated Patient View Care Plan Flowsheet for Team identified  Problems, Interventions, and Goals.    Signed by: [name], MSW 11/07/2019 5:24:00 PM

## 2019-11-07 NOTE — Rehab Progress Note (Medilinks) (Signed)
Christy Mcintosh  MRN: [id number]  Account: [id number]  Session Start: 11/07/2019 12:00:00 AM  Session Stop: 11/07/2019 12:00:00 AM    Total Treatment Minutes:  Minutes    Rehabilitation Nursing  Inpatient Rehabilitation Shift Assessment    Rehab Diagnosis: Monophasic Demyelinating Myeloencephalitis  Demographics:            Age: 43Y            Gender: Female  Primary Language: English    Date of Onset:  09/12/19  Date of Admission: 11/06/2019 5:56:00 PM    Rehabilitation Precautions Restrictions:   Fall Precaution and Skin Precaution    Patient Report: "  I am fine ."  Patient/Caregiver Goals:  "I want to get stronger and go back home"    Wounds/Incisions:       Old scars to the lower back and r thigh .    Medication Review: The physician was notified of a clinically significant  medication issue:  Drug Regimen Issue: Medication omitted that was previously taken at acute  hospital or home  Name of Physician Notified: Dr Anette Riedel  Date/Time of Physician Notification: 11/06/2019  Action Taken: Ordered PRN medications  Issue Resolution: The medication issue was resolved.    Bowel and Bladder Output:                       Bladder (# only)             Bowel (# only)  Number of Episodes  Continent            4                            1  Incontinent          0                            0    Additional Education Provided:    Education Provided: Safety issues and interventions. Fall protocol. Medication.  Name and dosage. Purpose. Administration. Side Effects.       Audience: Patient.       Mode: Explanation.       Response: Verbalized understanding.  Needs reinforcement.    TEAM CARE PLAN  Identified problems from team documentation:  Problem: Impaired Pain Management  Pain Mgmt: Primary Team Goal: Patient will verbalize pain less than or equal to  2 out of 10 with the use of medication and positioning techniques/    Problem: Impaired Self-care Mgmt/ADL/IADL  Self Care: Primary Team Goal: patient will require no more than  minimum  assistance for bathing and dressing activities during hospitalization/    Problem: Impaired Skin/Wound Mgmt  Skin Wound: Primary Team Goal: Patient  will independently perform weight shifts  to prevent skin breakdown/    Problem: Safety Risk and Restraint  Safety: Primary Team Goal: Patient will follow safety precautions and call for  assistance 100% of the time during hospitalization/    Please review Integrated Patient View Care Plan Flowsheet for Team identified  Problems, Interventions, and Goals.    Signed by: Manuella Ghazi, RN 11/07/2019 2:12:00 AM

## 2019-11-07 NOTE — Rehab Evaluation ([facility]) (Signed)
Christy Mcintosh  MRN: [id number]  Account: [id number]  Session Start: 11/07/2019 11:00:00 AM  Session Stop: 11/07/2019 12:00:00 PM    Total Treatment Minutes: 60.00 Minutes    Physical Therapy  Inpatient Rehabilitation Eval    Rehab Diagnosis: Monophasic Demyelinating Myeloencephalitis  Demographics:            Age: 59Y            Gender: Female  Primary Language: English  Past Medical History: Interstitial Cystitis  DM  HTN  Hypothyroidism  Asthma  GERD/IBS  Seizures D/O  Fibromyalgia  DDD Lumbar Spine  Overactive Bladder  Chronic Constipation  GI bleed  Esophageal Stricture    PSH:  Appendectomy  Bladder sling  Hysterectomy  BS and ovarian removal  Craniotomy secondary to meningioma  Breast Bx's  Bilateral knee meniscus surgeries  Cataract Surgeries  Rhinoplasty  Rotator cuff surgery  History of Present Illness: Christy Mcintosh is a 59 year old female originally  admitted to [facility] in January 2021 with colonic  inertia.  She was taken to the OR on 09/12/19 for Colonic Inertia, S/P Total  abdominal Colectomy with ileorectal anastamosis. Post op complicated by ilieus  and was transferred to [facility] on 10/11/19 for further treatment of post op needs  and new dx of subacute progressive myelopathy requiring Neurology consult.  The  patient was also receiving TPN, now discontinued and the patient is tolerating  diet well with improved intake. On Marinol-appetite stimulant. Regular diet with  Ensure BID.+BM.  Regarding monophasic Autoimmune Demyelinating  Myeloencephalitis-presented with subacute progressive myelopathy on 10/03/19  with BLE paraparesis and hyporeflexia.  Imaging significant for transverse  myelitis with patchy, non diffusing T2 abnormalities involving the corticospinal  tracts.  Completed 5/5 PLEX started on 09/25/19 with improvement noted.  EMG  completed on 10/14/19 without s/o of neuropathy.  PM+R consult completed as well  as neurology consult.  The patient was also followed  for the following acute  medical conditions: UTI/Cystitis- Started on Nitrofurontinin SR 100 mg BID  Started on March 10th, myeloradicular lumbar back pain- continues on Tylenol,  Cymbalta, Robaxin, and Lidoderm patches, Anemia-Continue Iron and monitor H+H  6.  MRSA Precautions-Resolved, Testing negative from 10/23/19, pt removed from  serial precautions. Hx of DM, Continue Insulin as ordered. Seizure Disorder, Hx  of Craniotomy for Meningioma resection.  Continues on PO Vimpat.   Hypothyroidism-Continue on PO Synthroid.  The patient is alert and oriented x4,  pleasant and cooperative with care needs. Tolerating regular diet well, + BM.  Paraparesis improving and the patient is working well with therapy services with  progress made.  The patient has been working well with therapy services to  include PT and OT to address functional mobility, self care deficit, and pain.  The patient would benefit from an intensive level of rehab along with close  medical management of multiple medical conditions and rehab MD to drive the  rehab process.  The patient is medically stable for IRF level of care.   Date of Onset: 09/12/19   Date of Admission: 11/06/2019 5:56:00 PM  Premorbid Functional Level: Independent  Social/Education History: none at this time  Home Environment: The patient lives with her spouse in a 1 level home with 1 STE  in [place].    Medications and Allergies: Significant rehabilitation considerations:   see EPIC  Rehabilitation Precautions/Restrictions:   Fall Precaution and Skin Precaution    SUBJECTIVE  Patient/Caregiver Goals:  Patient's functional goals: "I  want to get stronger  and go back home"  Pain: Patient currently has pain.  Location: back  Type: Acute  Quality: Tender.  Pain Scale: Numeric.  Patient reports a pain level of 7 out of 10.  Patient's acceptable level of pain 0 out of 10.   Interferes with physical activity.  Pain is alleviated by: rest, ice, heat, meds  Pain is exacerbated by:  movement, hospital bed Repositioned patient.  Pain Reassessment: Pain was not reassessed as no pain was reported.    OBJECTIVE  General Observations: pleasant and ready to go.  Understanding of Current Condition: good  Vital Signs:                       Before Activity              After Activity  Vitals  BP Systolic          139                          134  BP Diastolic         96                           91  Pulse                68                           88  O2 Saturation        97%                          -    Communication/Cognition: WFL  Sensation: Numbness/tingling noted in BLES, mostly feet seems to be improving  per pt  Range of Motion: Minimally decreased in BLEs due to prolonged hospitalization  Strength/Motor Control: 4/5 grossly in BLES  Balance: sitting balance with S, standing balance with minA without AD    Lower Extremity Orthosis: Patient does not present with foot drop or ankle  instability and does not need an orthotic.    Interventions:  Moderate Complexity Evaluation: A history of present problem with 1-2 personal  factors and/or comorbidities that impact the plan of care  An examination of body systems using standardized tests and measures in  addressing a total of 3 or more elements from any of the following body  structures and functions, activity limitations, and/or participation  restrictions  An evolving clinical presentation with changing characteristics  Clinical decision-making of moderate complexity using standardized patient  assessment instrument and/or measurable assessment of functional outcome No  treatment provided today.  Patient was left seated in chair in his/her room. Handoff to nurse completed.  Chair alarm in place and activated.  Oriented to call bell and placed within reach.  Personal items within reach.  Assistive devices positioned out of reach.    Interdisciplinary Educational Needs and Learning Preferences:       Learning Preference: The patient's preferred  learning method is:  Explanation.  The patient's preferred learning method is: Demonstration.  The patient's preferred learning method is: Programme researcher, broadcasting/film/video.       Barriers to Learning: No barriers.       Learning Needs: Functional activities/mobility    Education Provided: Plan of care.       Audience: Patient.  Mode: Explanation.  Demonstration.  Teacher, English as a foreign language provided.       Response: Verbalized understanding.    ASSESSMENT  Summary of Deficits and Prognosis:  Pt presents s/p colectomy with Monophasic  demyelinating myeloencephalits that presents with ataxia, decreased strength,  endurance, ROM, balance, sensation, all decreasing her ability to perform  functional mobility safely and independently at this time.  Pt will benefit from  skilled inpatient rehab for increased safety and independence for safe D/C home.  Rehab Potential: Able to participate in an intensive inpatient interdisciplinary  rehabilitation program  Barriers to Progress/Discharge: No potential barriers to progress.    CARE Tool  The scores below reflect the patient's usual function prior to therapeutic  intervention.    Mobility Functional Assessment  Roll Left and Right: 04 - Supervision: Helper provides supervision for safety or  verbal cues ONLY  Assistive Device(s):   None    Sit to Lying: 04 - Supervision: Helper provides supervision for safety or verbal  cues ONLY  Assistive Device(s):   None    Lying to Sitting on Side of Bed: 04 - Supervision: Helper provides supervision  for safety or verbal cues ONLY  Assistive Device(s):   None    Sit to Stand: 04 - Touching Assistance: Helper provides  touching/steadying/contact guard  Assistive Device(s):   Rolling walker    Chair/Bed-to-Chair Transfer: 04 - Touching Assistance: Helper provides  touching/steadying/contact guard  Assistive Device(s):   Systems developer Transfer: 04 - Touching Assistance: Helper provides  touching/steadying/contact guard    Walk 10 Feet: 04 - Touching  Assistance: Helper provides  touching/steadying/contact guard   RW    Walk 50 Feet with Two Turns: 04 - Touching Assistance: Helper provides  touching/steadying/contact guard   steadying for balance with RW, ataxia noted, narrow BOS    Walk 150 Feet:  88 - Not attempted due to medical or safety concerns.   fatigue    Walking 10 Feet on Uneven Surface: 04 - Touching Assistance: Helper provides  touching/steadying/contact guard   steadying for balance with RW, ataxia noted, narrow BOS    1 Step (curb):  03 - Partial/Moderate Assistance: Helper does 1-25% of the  activity   minA with balance due to decreased coordination, used BHRS    4 Steps:  03 - Partial/Moderate Assistance: Helper does 1-25% of the activity    12 Steps: 43 - Not attempted due to medical or safety concerns.    Wheelchair: Patient does not use a wheelchair or scooter.  Mobility Discharge Goals (not met at this time):   Chair/Bed to Chair Transfer: Patient completed the activities by him/herself  with no assistance from a helper.   Walk Discharge Goals:   Walk 150 Feet: Patient completed the activities by him/herself with no  assistance from a helper.   12 Steps Up and Down, With/Without Rail: Helper provides verbal cues or  touching/steadying assistance as patient completes activity.    Short Term Goals:  Not applicable.  Long Term Goals:   Time frame to achieve long term goal(s): 7-10days from IE on  11/07/19       1. Pt will be I with stand pivot transfers with LRAD.       2. Pt will gait 150' with ModI with LRAD.       3. Pt will negotiate up/down 1 step with LRAD with ModI to enter home.    Risks/Benefits of Rehabilitation Discussed with Patient/Caregiver: Yes.  Recommendations/Goals for Rehabilitation Discussed  with Patient/Caregiver: Yes.    PLAN  Physical Therapy Plan: Physical Therapy is recommended.  Recommended Frequency/Duration/Intensity: 1-2hrs/day, 5-6days/week for 7-10days  from IE on 11/07/19  Activities Contributing Toward Care Plan:  neuro re-ed, bed mobility, transfers,  gait, stairs, AD training, ther ex/act, balance, D/C planning, groups as  appropriate  The patient is not appropriate for group treatment.    Team Care Plan  Please review Integrated Patient View Care Plan Flowsheet for Team identified  Problems, Interventions, and Goals    Identified problems from team documentation:  Problem: Impaired Pain Management  Pain Mgmt: Primary Team Goal: Patient will verbalize pain less than or equal to  2 out of 10 with the use of medication and positioning techniques/    Problem: Impaired Self-care Mgmt/ADL/IADL  Self Care: Primary Team Goal: patient will require no more than minimum  assistance for bathing and dressing activities during hospitalization/    Problem: Impaired Skin/Wound Mgmt  Skin Wound: Primary Team Goal: Patient  will independently perform weight shifts  to prevent skin breakdown/    Problem: Safety Risk and Restraint  Safety: Primary Team Goal: Patient will follow safety precautions and call for  assistance 100% of the time during hospitalization/    Identified problems from this assessment:     Mobility : Pt will perform all mobility with ModI with LRAD for safe D/C home.    Discipline:  Physical Therapy    3 Hour Rule Minutes: 60 minutes of PT treatment this session count towards  intensity and duration of therapy requirement. Patient was seen for the full  scheduled time of PT treatment this session.  Therapy Mode Minutes: Individual: 60 minutes.    Signed by: [name], DPT 11/07/2019 12:00:00 PM

## 2019-11-07 NOTE — Rehab Progress Note ([facility]) (Signed)
NAME: Christy Mcintosh  MRN: [id number]  Account: [id number]  Session Start: 11/07/2019 1:00:00 PM  Session Stop: 11/07/2019 2:00:00 PM    Total Treatment Minutes: 60.00 Minutes    Occupational Therapy  Inpatient Rehabilitation Treatment Note    Rehab Diagnosis: Monophasic Demyelinating Myeloencephalitis  Demographics:            Age: 21Y            Gender: Female  Rehabilitation Precautions/Restrictions:   Fall Precaution and Skin Precaution    Vital Signs:                       Before Activity              After Activity  Vitals  Position/Activity    Seated in w/c                -  BP Systolic          128                          -  BP Diastolic         86                           -  Pulse                82                           -  O2 Saturation        100                          -    Interventions:       Therapeutic Activities:  Handoff completed with RN and pt cleared for  therapy. Pt was seen in room seated in w/c. Pt completed the following exercises  while standing at RW with CGA: standing marches x15, anterior toe taps on felt  pad x15 and anterior toe taps on cone x15. Pt then completed the following  exercises while seated: spell alphabet letters in air with each foot and LAQs  x20 with red theraband resistance. All exercises completed in order to increase  BLE strength, motor control and standing balance.       Self Care/Home Management:  Pt completed w/c <> toilet transfers with CGA  using grab bars, and all toileting tasks with SPV. Pt performed w/c to bed SPT  with CGA and sit to supine transition with SPV.  Patient was returned to or left in bed. Handoff to nurse completed.  Bed alarm in place and activated.  Oriented to call bell and placed within reach.  Personal items within reach.  Assistive devices positioned out of reach.  Bed placed in lowest position.    ASSESSMENT  Pt with good motivation to participate in therapy. Pt would benefit from  increased exercises focusing on strength, balance, and  motor control to increase  independence with ADLs, IADLs and functional transfers.    3 Hour Rule Minutes: 60 minutes of OT treatment this session count towards  intensity and duration of therapy requirement. Patient was seen for the full  scheduled time of OT treatment this session.  Therapy Mode Minutes: Individual: 60 minutes.    Signed by: Penelope Coop, OTR/L 11/07/2019 2:00:00 PM

## 2019-11-07 NOTE — Assessment & Plan Note (Signed)
NAMESADAKO Mcintosh  MRN: [id number]  Account: [id number]  Session Start: 11/07/2019 12:00:00 AM  Session Stop: 11/07/2019 12:00:00 AM    Total Treatment Minutes:  Minutes    Nutrition  Inpatient Rehabilitation Initial Assessment - Limited    Rehab Diagnosis: Monophasic Demyelinating Myeloencephalitis  Demographics:            Age: 59Y            Gender: Female  Primary Language: English    Past Medical History: Interstitial Cystitis  DM  HTN  Hypothyroidism  Asthma  GERD/IBS  Seizures D/O  Fibromyalgia  DDD Lumbar Spine  Overactive Bladder  Chronic Constipation  GI bleed  Esophageal Stricture    PSH:  Appendectomy  Bladder sling  Hysterectomy  BS and ovarian removal  Craniotomy secondary to meningioma  Breast Bx's  Bilateral knee meniscus surgeries  Cataract Surgeries  Rhinoplasty  Rotator cuff surgery  History of Present Illness: Christy Mcintosh is a 59 year old female originally  admitted to [facility] in January 2021 with colonic  inertia.  She was taken to the OR on 09/12/19 for Colonic Inertia, S/P Total  abdominal Colectomy with ileorectal anastamosis. Post op complicated by ilieus  and was transferred to [facility] on 10/11/19 for further treatment of post op needs  and new dx of subacute progressive myelopathy requiring Neurology consult.  The  patient was also receiving TPN, now discontinued and the patient is tolerating  diet well with improved intake. On Marinol-appetite stimulant. Regular diet with  Ensure BID.+BM.  Regarding monophasic Autoimmune Demyelinating  Myeloencephalitis-presented with subacute progressive myelopathy on 10/03/19  with BLE paraparesis and hyporeflexia.  Imaging significant for transverse  myelitis with patchy, non diffusing T2 abnormalities involving the corticospinal  tracts.  Completed 5/5 PLEX started on 09/25/19 with improvement noted.  EMG  completed on 10/14/19 without s/o of neuropathy.  PM+R consult completed as well  as neurology consult.  The patient was  also followed for the following acute  medical conditions: UTI/Cystitis- Started on Nitrofurontinin SR 100 mg BID  Started on March 10th, myeloradicular lumbar back pain- continues on Tylenol,  Cymbalta, Robaxin, and Lidoderm patches, Anemia-Continue Iron and monitor H+H  6.  MRSA Precautions-Resolved, Testing negative from 10/23/19, pt removed from  serial precautions. Hx of DM, Continue Insulin as ordered. Seizure Disorder, Hx  of Craniotomy for Meningioma resection.  Continues on PO Vimpat.   Hypothyroidism-Continue on PO Synthroid.  The patient is alert and oriented x4,  pleasant and cooperative with care needs. Tolerating regular diet well, + BM.  Paraparesis improving and the patient is working well with therapy services with  progress made.  The patient has been working well with therapy services to  include PT and OT to address functional mobility, self care deficit, and pain.  The patient would benefit from an intensive level of rehab along with close  medical management of multiple medical conditions and rehab MD to drive the  rehab process.  The patient is medically stable for IRF level of care.            Date of Onset: 09/12/19            Date of Admission: 11/06/2019 5:56:00 PM    Medications and Allergies: Significant rehabilitation considerations:   see EPIC  Rehabilitation Precautions/Restrictions:   Fall Precaution and Skin Precaution    SUBJECTIVE  Patient Reports: H/o DM, HTN, GERD, IBS. H/o total colectomy with ileorectal  anastamosis (09/12/19) per ct  review. Noted on Marinol. Observed that pt ate 50%  of lunch. 25% of breakfast per flowsheets. C/o diarrhea. No food allergies.  Lactose intolerance: can tolerate yogurt but no milk, cheese, and ice cream. BP:  134/91 (3/18). Skin: intact per flowsheets.  No edema per flowsheets.  Social History: none at this time  Patient/Caregiver Goals:  Patient's functional goals: "I want to get stronger  and go back home"    OBJECTIVE  Labs:   Results for  Christy, Mcintosh (MRN [id number]) as of 11/07/2019 13:01    11/07/2019 07:00  Glucose: 100  BUN: 10  Creatinine: 0.8  Sodium: 142  Potassium: 4.1  Chloride: 108  CO2: 25  Calcium: 9.7  Anion Gap: 9.0  EGFR: >60.0  Weight: 140 pounds. 63.64 kilograms.  Height:  63 inches. 1.6 meters.  BMI: 24.86 kilogram per meters squared.  Weight Group:  Normal weight    Admission Weight: 140  Usual Weight: N/A pounds.    Food Allergies/Intolerances: Lactose / Milk: Unknown.   No milk, cheese and ice cream. Tolerates yogurt.  Religious/Cultural Food Practices: No egg  Current Medical/Nutrition Therapy: Diet: Regular   diet with no liquid consistency restrictions.    % of Meals Consumed: 25-50 %  Feeding Modality and Dentition: Oral  Current Problems/GI Symptoms: Diarrhea.  Wounds/Incisions:  Absent    ASSESSMENT  Overall Assessment of Nutritional Status:  Current Nutrition Intake: Adequate.  Current Nutrition Status: Mildly compromised.  Nutritional Risk Assessment:  No nutritional risk present.  Nutrition Diagnosis: No nutrition problems currently.    Interventions Provided/Recommendations:   Assessment Completed.  Education/Discharge Planning Needs: Not applicable.    PLAN  Recommendations for Follow-up: 1.) Low fiber, Low sodium, Low lactose, and  consistent Christy Mcintosh diet. 2.) Ensure compact BID: 220 Kcal, 9 g protein, 0 g fiber  per 4 oz. 3.) Monitor PO/ONS intake, weight, labs, GI issues (diarrhea), and  skin integrity. 4.) Follow up within 7-14 days.    Care Plan  Identified problems from team documentation:  Problem: Impaired Mobility  Mobility: Primary Team Goal: Pt will perform all mobility with ModI with LRAD  for safe D/C home./    Problem: Impaired Pain Management  Pain Mgmt: Primary Team Goal: Patient will verbalize pain less than or equal to  2 out of 10 with the use of medication and positioning techniques/    Problem: Impaired Self-care Mgmt/ADL/IADL  Self Care: Primary Team Goal: patient will require no more than  minimum  assistance for bathing and dressing activities during hospitalization/    Problem: Impaired Skin/Wound Mgmt  Skin Wound: Primary Team Goal: Patient  will independently perform weight shifts  to prevent skin breakdown/    Problem: Safety Risk and Restraint  Safety: Primary Team Goal: Patient will follow safety precautions and call for  assistance 100% of the time during hospitalization/    Identified problems from this assessment:     No problems identified at this time.    Please review Integrated Patient View Care Plan Flowsheet for Team identified  Problems, Interventions, and Goals.    Signed by: Onalee Hua, RD 11/07/2019 1:05:00 PM

## 2019-11-08 ENCOUNTER — Encounter: Payer: Self-pay | Admitting: Physical Medicine & Rehabilitation

## 2019-11-08 LAB — URINALYSIS REFLEX TO MICROSCOPIC EXAM - REFLEX TO CULTURE
Blood, UA: NEGATIVE
Glucose, UA: NEGATIVE
Ketones UA: NEGATIVE
Nitrite, UA: NEGATIVE
Protein, UR: 30 — AB
Specific Gravity UA: 1.028 (ref 1.001–1.035)
Urine pH: 5 (ref 5.0–8.0)
Urobilinogen, UA: NEGATIVE mg/dL (ref 0.2–2.0)

## 2019-11-08 LAB — IRON PROFILE
Iron Saturation: 11 % — ABNORMAL LOW (ref 15–50)
Iron: 31 ug/dL — ABNORMAL LOW (ref 40–145)
TIBC: 276 ug/dL (ref 265–497)
UIBC: 245 ug/dL (ref 126–382)

## 2019-11-08 LAB — FOLATE: Folate: >20 ng/mL

## 2019-11-08 LAB — VITAMIN B12: Vitamin B-12: >2000 pg/mL — ABNORMAL HIGH (ref 211–911)

## 2019-11-08 LAB — HEMOLYSIS INDEX: Hemolysis Index: 4 (ref 0–18)

## 2019-11-08 LAB — GLUCOSE WHOLE BLOOD - POCT
Whole Blood Glucose POCT: 98 mg/dL (ref 70–100)
Whole Blood Glucose POCT: 96 mg/dL (ref 70–100)
Whole Blood Glucose POCT: 88 mg/dL (ref 70–100)
Whole Blood Glucose POCT: 101 mg/dL — ABNORMAL HIGH (ref 70–100)

## 2019-11-08 LAB — VITAMIN D,25 OH,TOTAL: Vitamin D, 25 OH, Total: 31 ng/mL (ref 30–100)

## 2019-11-08 NOTE — Rehab Progress Note ([facility]) (Signed)
Christy Mcintosh  MRN: [id number]  Account: [id number]  Session Start: 11/08/2019 10:00:00 AM  Session Stop: 11/08/2019 11:00:00 AM    Total Treatment Minutes: 60.00 Minutes    Occupational Therapy  Inpatient Rehabilitation Treatment Note    Rehab Diagnosis: Monophasic Demyelinating Myeloencephalitis  Demographics:            Age: 62Y            Gender: Female  Rehabilitation Precautions/Restrictions:   Fall Precaution and Skin Precaution    Vital Signs:  Grid    Interventions:       Therapeutic Activities:  Pt received seated in w/c, agreeable to therapy.  RN present for medications, pt reporting difficulty sleeping d/t stiffness of  bed. Upon further observation, noted bed was not conducting pressure relief,  therefore time spent discussing with charge RN -bed replaced. Pt trialed new  bed, noted min improvements.  All transfers grossly CGA with RW.  While supine on mat, pt engaged in variety of back/BLE stretches d/t reports of  "tightness" from difficulty sleeping. Pt completed 2 sets of thoracic T- stretch  bilaterally, hamstring stretch, seated rotational stretch at EOM. Pt engaged in  item retrieval at gait level with CGA and RW, picking items up from floor with  CGA, trialing use of reacher with SPV.  Patient was left seated in chair in his/her room. Handoff to nurse completed.  Chair alarm in place and activated.  Oriented to call bell and placed within reach.  Personal items within reach.  Assistive devices positioned out of reach.    ASSESSMENT  Pt reported immediate improvement from back stretches prior to functional  mobility. Pt encouraged to complete HEP in AM and prior to bed for more restful  sleep. Pt may benefit from use of reacher for increased safety with item  retrieval.    3 Hour Rule Minutes: 60 minutes of OT treatment this session count towards  intensity and duration of therapy requirement. Patient was seen for the full  scheduled time of OT treatment this session.  Therapy Mode Minutes:  Individual: 60 minutes.    Signed by: Archer Asa, OTR/L 11/08/2019 11:00:00 AM

## 2019-11-08 NOTE — Rehab Progress Note ([facility]) (Signed)
Christy Mcintosh  MRN: [id number]  Account: [id number]  Session Start: 11/08/2019 12:00:00 AM  Session Stop: 11/08/2019 12:00:00 AM    Total Treatment Minutes:  Minutes    Rehabilitation Nursing  Inpatient Rehabilitation Shift Assessment    Rehab Diagnosis: Monophasic Demyelinating Myeloencephalitis  Demographics:            Age: 18Y            Gender: Female  Primary Language: English    Date of Onset:  09/12/19  Date of Admission: 11/06/2019 5:56:00 PM    Rehabilitation Precautions Restrictions:   Fall Precaution and Skin Precaution    Patient Report: "I am okay"  Patient/Caregiver Goals:  "I want to get stronger and go back home"    Wounds/Incisions:       Old scars to the lower back, abdomen and rt thigh    Medication Review: No clinically significant medication issues identified this  shift.    Bowel and Bladder Output:                       Bladder (# only)             Bowel (# only)  Number of Episodes  Continent            3                            0  Incontinent          0                            0    Additional Education Provided:    Education Provided: Safety issues and interventions. Fall protocol. Supervision  requirements. Use of adaptive devices. Diabetes. Medication. Glucose testing.  Insulin drawing/mixing/administration. Skin care. Effects on wound healing.  Diabetic foot care. Diet management. Hypoglycemia causes/symptoms/treatment.  Hyperglycemia causes/symptoms/treatment. Weight management. Medication. Name and  dosage. Administration. Purpose. Side Effects. Interaction. Administration of  Lovenox injections.       Audience: Patient.       Mode: Explanation.       Response: Verbalized understanding.    TEAM CARE PLAN  Identified problems from team documentation:  Problem: Impaired Mobility  Mobility: Primary Team Goal: Pt will perform all mobility with ModI with LRAD  for safe D/C home./Active    Problem: Impaired Pain Management  Pain Mgmt: Primary Team Goal: Patient will verbalize pain  less than or equal to  2 out of 10 with the use of medication and positioning techniques/    Problem: Impaired Self-care Mgmt/ADL/IADL  Self Care: Primary Team Goal: Pt will complete ADL routine and household  transfers with mod I/    Problem: Impaired Skin/Wound Mgmt  Skin Wound: Primary Team Goal: Patient  will independently perform weight shifts  to prevent skin breakdown/    Problem: Safety Risk and Restraint  Safety: Primary Team Goal: Patient will follow safety precautions and call for  assistance 100% of the time during hospitalization/    Please review Integrated Patient View Care Plan Flowsheet for Team identified  Problems, Interventions, and Goals.    Signed by: Biagio Quint, RN 11/08/2019 4:55:00 PM

## 2019-11-08 NOTE — Rehab Evaluation ([facility]) (Signed)
[name]  MRN: [id number]  Account: [id number]  Session Start: 11/08/2019 9:00:00 AM  Session Stop: 11/08/2019 10:00:00 AM    Total Treatment Minutes: 60.00 Minutes    Speech Language Pathology  Inpatient Rehabilitation Language Cognitive-Dysphagia Evaluation    Rehab Diagnosis: Monophasic Demyelinating Myeloencephalitis  Demographics:            Age: 59Y            Gender: Female  Primary Language: [place]    Past Medical History: Interstitial Cystitis  DM  HTN  Hypothyroidism  Asthma  GERD/IBS  Seizures D/O  Fibromyalgia  DDD Lumbar Spine  Overactive Bladder  Chronic Constipation  GI bleed  Esophageal Stricture    PSH:  Appendectomy  Bladder sling  Hysterectomy  BS and ovarian removal  Craniotomy secondary to meningioma  Breast Bx's  Bilateral knee meniscus surgeries  Cataract Surgeries  Rhinoplasty  Rotator cuff surgery  History of Present Illness: Christy Mcintosh is a 59 year old female originally  admitted to [facility] in January 2021 with colonic  inertia.  She was taken to the OR on 09/12/19 for Colonic Inertia, S/P Total  abdominal Colectomy with ileorectal anastamosis. Post op complicated by ilieus  and was transferred to [facility] on 10/11/19 for further treatment of post op needs  and new dx of subacute progressive myelopathy requiring Neurology consult.  The  patient was also receiving TPN, now discontinued and the patient is tolerating  diet well with improved intake. On Marinol-appetite stimulant. Regular diet with  Ensure BID.+BM.  Regarding monophasic Autoimmune Demyelinating  Myeloencephalitis-presented with subacute progressive myelopathy on 10/03/19  with BLE paraparesis and hyporeflexia.  Imaging significant for transverse  myelitis with patchy, non diffusing T2 abnormalities involving the corticospinal  tracts.  Completed 5/5 PLEX started on 09/25/19 with improvement noted.  EMG  completed on 10/14/19 without s/o of neuropathy.  PM+R consult completed as well  as  neurology consult.  The patient was also followed for the following acute  medical conditions: UTI/Cystitis- Started on Nitrofurontinin SR 100 mg BID  Started on March 10th, myeloradicular lumbar back pain- continues on Tylenol,  Cymbalta, Robaxin, and Lidoderm patches, Anemia-Continue Iron and monitor H+H  6.  MRSA Precautions-Resolved, Testing negative from 10/23/19, pt removed from  serial precautions. Hx of DM, Continue Insulin as ordered. Seizure Disorder, Hx  of Craniotomy for Meningioma resection.  Continues on PO Vimpat.   Hypothyroidism-Continue on PO Synthroid.  The patient is alert and oriented x4,  pleasant and cooperative with care needs. Tolerating regular diet well, + BM.  Paraparesis improving and the patient is working well with therapy services with  progress made.  The patient has been working well with therapy services to  include PT and OT to address functional mobility, self care deficit, and pain.  The patient would benefit from an intensive level of rehab along with close  medical management of multiple medical conditions and rehab MD to drive the  rehab process.  The patient is medically stable for IRF level of care.   Date of Onset: 09/12/19   Date of Admission: 11/06/2019 5:56:00 PM  Premorbid Functional Level: Per interview, independent w/ ADLs and IADLs which  was also verified in medical charting. Of note, endorses some premorbid memory  difficulty before hospital admission.  Social/Educational History: None at this time. Will f/u to gather additional  information in next opportunity.  Home Environment: The patient lives with her spouse in a 1 level home with  1 STE  in [place].    Medications and Allergies: Significant rehabilitation considerations:   see EPIC  Rehabilitation Precautions/Restrictions:   Fall Precaution and Skin Precaution    SUBJECTIVE  Patient/Caregiver Goals:  Patient's functional goals: "I want to get stronger  and go back home"  Understanding of Current Condition: Pt  has excellent awareness of medical  situation and can adequately detail multitude of diagnoses made during  hospitalization as well as reflect on impacts they have had on her functioning.  Pain: Patient currently without complaints of pain.  Pain Reassessment: Pain was not reassessed as no pain was reported.    OBJECTIVE  Vision and Hearing: Denies any vision difficulty and presented w/ grossly  adequate hearing for discourse speech in quiet setting.    Oral Motor Exam: GWFL  Swallow:   Clinical Assessment: Pt accepted serial sips of thin by straw w/o clinical s/s  of aspiration. Per RN, no evidence of overt swallowing difficulty when accepting  whole meds w/ liquids.   Recommended Diet: Regular diet. Thin liquids.   Swallow Precautions: Upright position, Alert, Limit distractions    Language/Cognitive Tests Administered:  Repeatable Battery for the Assessment of Neuropsychological Status (RBANS):    List Learning Total Score 22  Story Memory Total Score 11  Immediate Memory Index Score 73 (Borderline)    Figure Copy Total Score 17  Line Orientation Total Score 11  Visuospatial/Constructional Index Score 78 (Borderline)    Picture Naming Total Score 10  Semantic Fluency Total Score 8  Language Index Score 75 (Borderline)    Digit Span Total Score 6  Coding Total Score 19  Attention Index Score 56 (Extremely Low)    List Recall Total Score 1  List Recognition Total Score 14  Story Recall Total Score 1  Figure Recall Total Score 13  Delayed Memory Index Score 60 (Extremely Low)    Sum of Index Scores 342  Total Scale 60 (Extremely Low)    Communication:   Motor Speech: GWFL   Auditory Comprehension:   GWFL; requiring extra time for formulation of some responses likely 2/2 delayed  processing   Reading Comprehension: DNT   Verbal Expression:  Occasional evidence of word finding difficulty w/o proper  use of compensatory method. Please see RBANS subtest results above.   Pragmatic Language:  GWFL   Written Expression:   DNT    Cognition:   Attention:       See RBANS subtest results above.   Processing: Extra time required for formulating responses particularly when  remarking on mental status changes sustained since hospitalization. Pt confessed  to using no strategies in order to better mitigate her difficulties.   Behavior: Pt pleasant and highly cooperative. Occasional instances of  frustrations brought on by struggles in recalling stimuli or explaining  perception of her disorder characteristics.   Memory:       See RBANS subtest results above.   Problem Solving/Reasoning: Per nursing, successful in using call bell to  receive assistance w/ daily needs in rehabilitation program. Noted lack of  problem solving in regards to mitigating her occasional struggles in word  retrieval or thought generation.   Awareness/Judgment: GWFL   Executive Functioning: Did not formally assess given time constraints, however  suspect at least some degree of compromised domain which merits further  investigation. Breakdowns in short term memory, working memory, as well as more  complex attention skills are likely to play a role in her capacity for higher  level cognitive  functioning. [facility] Scale*:  The patient?s cognitive functioning was assessed  using the [facility] Cognitive Scale. Score based on this assessment is:  Not Applicable.    CARE Tool  The scores below reflect the patient's usual function prior to therapeutic  intervention.    Hearing, Speech, Vision: Expression of Ideas and Wants: Exhibits some difficulty  with expressing needs and ideas (e.g., some words or finishing thoughts) or  speech is not clear.  Understanding Verbal and Non-Verbal Content: Understands: Clear comprehension  without cues or repetitions.    Interventions: None provided today.  Patient was left seated in chair in his/her room. Handoff to nurse completed.  Oriented to call bell and placed within reach.  Personal items within  reach.  Interdisciplinary Educational Needs and Learning Preferences:       Learning Preference: The patient's preferred learning method is:  Explanation.  The patient's preferred learning method is: Demonstration.  The patient's preferred learning method is: Video.  The patient's preferred learning method is: Programme researcher, broadcasting/film/video.  The patient's preferred learning method is: Class.       Barriers to Learning: Cognitive limitations.       Learning Needs: Communication/cognition    Education Provided: Cognitive functioning.       Audience: Patient.       Mode: Explanation.       Response: Verbalized understanding.    ASSESSMENT  Pt is a 59 y/o female presenting to AR w/ monophasic demyelinating  myeloencephalitis.Neurology following and investigating possible diagnosis of  meningoencephalitis. Past history significant for craniotomy 2/2 meningioma and  seizure disorder. Referred by MD to SLP services for cognitive-linguistic  evaluation. Clinical presentation characterized by impairments in short term  memory, working memory, and attention which were more pronounced in formal  testing vs functional opportunities. Endorsed history of pre-existing memory  difficulties which she perceives to have been exacerbated by current situation.  Did not voice use of any compensatory techniques in order to more effectively  mitigate her weaknesses but demonstrated openness to education and training of  strategies. Per charting and nursing collaboration, team members w/ impressions  of only some "forgetfulness". Will benefit from short bout of skilled SLP  services in order to promote better management of cognitive deficits and  facilitate ways to more successfully resume premorbid IADLs.  Rehab Potential: Able to participate in an intensive inpatient interdisciplinary  rehabilitation program, Good family/social support, Good premorbid functional  status, Living in the community premorbidly, Motivated  Barriers to Progress/Discharge:  Medical condition    Short Term Goals: Not applicable.  Long Term Goals:  Time frame to achieve long term goal(s):  7-10 days from IE       1. Pt will utilize compensatory cognitive strategies in order to aide new  learning of medical and rehab education given only min cues from  therapist/caregiver.       2. Pt will successfully adopt strategies (e.g., re-reading, underlining,  re-checking) in order to better process, organize, and plan information in more  complex contexts (med management, financial affairs, appointment scheduling)  given only min cues from therapist/caregiver.    Risks/Benefits of Rehabilitation Discussed with Patient/Caregiver: Yes.  Recommendations/Goals for Rehabilitation Discussed with Patient/Caregiver: Yes.    PLAN  Speech Pathology Plan: Speech Language Pathology is recommended to address:  Recommended Frequency/Duration/Intensity: 60-120 mins of skilled SLP services  5-6 days/week for 7-10 days  Activities Contributing Toward Care Plan: cognitive-linguistic retraining,  practice of compensatory cognitive strategies,  application of word finding  strategies, language stimulation, caregiver training, and d/c planning      TEAM CARE PLAN  Please review Integrated Patient View Care Plan Flowsheet for Team identified  Problems, Interventions, and Goals.    Identified problems from team documentation:  Problem: Impaired Mobility  Mobility: Primary Team Goal: Pt will perform all mobility with ModI with LRAD  for safe D/C home./Active    Problem: Impaired Pain Management  Pain Mgmt: Primary Team Goal: Patient will verbalize pain less than or equal to  2 out of 10 with the use of medication and positioning techniques/    Problem: Impaired Self-care Mgmt/ADL/IADL  Self Care: Primary Team Goal: Pt will complete ADL routine and household  transfers with mod I/    Problem: Impaired Skin/Wound Mgmt  Skin Wound: Primary Team Goal: Patient  will independently perform weight shifts  to prevent skin  breakdown/    Problem: Safety Risk and Restraint  Safety: Primary Team Goal: Patient will follow safety precautions and call for  assistance 100% of the time during hospitalization/    Identified problems from this assessment:     No problems identified at this time.    3 Hour Rule Minutes: 60 minutes of SLP treatment this session count towards  intensity and duration of therapy requirement. Patient was seen for the full  scheduled time of SLP treatment this session.  Therapy Mode Minutes: Individual: 60 minutes.    * Original [place] [address] Cognitive Scale co-authored by Bonney Aid, Ph.D.,  Margot Chimes, M.A., Lanier Ensign, M.A., [facility], [place].  Revised 07/06/73 by Margot Chimes, M.A., and Vernie Shanks, O.T.R.    Signed by: Trenda Moots, M.A., CCC/SLP 11/08/2019 10:00:00 AM

## 2019-11-08 NOTE — Progress Notes (Signed)
PHYSICAL MEDICINE AND REHABILITATION  PROGRESS NOTE  FACE TO Bruce Donath    Date Time: 11/08/19 4:38 PM    Patient Name: Christy Mcintosh, Christy Mcintosh  Admission date:  11/06/2019  (LOS: 2 days)    Subjective:     Patient has no new complaints. Denies pain. Tolerating therapies.   Patient complaining of dysuria, will check urine study     Functional Status:     Interventions:       Therapeutic Activities:  Gait training to and from therapy with RW with  CGA.  GAit training without AD with mina for balance several losses of balance  requiring maxA, scissoring noted, up to 200' bouts.  Performed standing marching  and side stepping with mat for BUE support with CGA.  Applied 2# ankle weights  for gait training with improved control noted, still scissoring and narrow base  of support but improved.  Stair training up/down 12 steps with HR with minA for  balance, occ scissoring noted.  Patient was returned to or left in bed. Handoff to nurse completed.    Medications:   Medication reviewed by me:     Scheduled Meds: PRN Meds:    acetaminophen, 1,000 mg, Oral, TID  ALPRAZolam, 2 mg, Oral, QHS  amphetamine-dextroamphetamine, 20 mg, Oral, BID  b complex-vitamin c-folic acid, 1 tablet, Oral, Daily  cholestyramine, 1 packet, Oral, BID  dronabinol, 2.5 mg, Oral, Daily  DULoxetine, 60 mg, Oral, Daily  enoxaparin, 40 mg, Subcutaneous, Daily  ezetimibe, 10 mg, Oral, QHS  lacosamide, 150 mg, Oral, BID  levothyroxine, 50 mcg, Oral, Daily at 0600  lidocaine, 1 patch, Transdermal, Q24H  losartan, 25 mg, Oral, Daily  pantoprazole, 40 mg, Oral, Daily at 0600  rosuvastatin, 40 mg, Oral, QHS  simethicone, 80 mg, Oral, TID  thiamine, 100 mg, Oral, Daily  vitamin B-12, 500 mcg, Oral, Daily  vitamin D (cholecalciferol), 25 mcg, Oral, Daily  vitamin E, 400 Units, Oral, Daily  zonisamide, 200 mg, Oral, BID        Continuous Infusions:   acetaminophen, 650 mg, Q6H PRN  dextrose, 15 g of glucose, PRN   And  dextrose, 12.5 g, PRN   And  glucagon (rDNA), 1 mg,  PRN  methocarbamol, 750 mg, TID PRN  oxyCODONE, 5 mg, Q6H PRN  polyethylene glycol, 17 g, Daily PRN          Medication Review  1. A complete drug regimen review was completed: Yes  2. Were drug issues were found during review: No   If yes to drug issues found during review:   What was the issue?:    What was the time the issue was identified?:    Was I contacted and action was taken by midnight of the next calendar day once issue was identified?: N/A    Person who contacted me:    Action taken:     Review of Systems:   A comprehensive review of systems was: No fevers, chills, nausea, vomiting, chest pain, shortness of breath, cough, headache, double vision.  All others negative.    Labs:     Recent Labs   Lab 11/07/19  0700   WBC 3.60   Hgb 9.0*   Hematocrit 28.2*   Platelets 391*      Recent Labs   Lab 11/07/19  0700   Sodium 142   Potassium 4.1   Chloride 108   CO2 25   BUN 10   Creatinine 0.8   Calcium 9.7   Albumin  4.6   Protein, Total 6.8   Bilirubin, Total 0.2   Alkaline Phosphatase 74   ALT 18   AST (SGOT) 17   Glucose 100         Rads:   Radiological Procedure reviewed.  Radiology Results (24 Hour)     ** No results found for the last 24 hours. **        Physical Exam:     Vitals:    11/08/19 0821 11/08/19 1517 11/08/19 1548 11/08/19 1609   BP: 111/78 126/86 133/87 109/77   Pulse: 86 89 83 85   Resp:    17   Temp: 98.2 F (36.8 C)   98.6 F (37 C)   TempSrc: Oral   Oral   SpO2: 100%  100% 100%   Weight:       Height:           Intake and Output Summary (Last 24 hours) at Date Time    Intake/Output Summary (Last 24 hours) at 11/08/2019 1638  Last data filed at 11/07/2019 2214  Gross per 24 hour   Intake 200 ml   Output 0 ml   Net 200 ml     P.O.: 200 mL (11/07/19 2214)     Urine: 0 mL (11/07/19 2214)             Cardiac: regular rhythm  Chest / Lungs:  Clear to auscultation.  Abdomen:  + bowel sounds, Soft, non-tender, non-distended.  Extremities: no calf tenderness.    Assessment and Plan:     59 year  old female patient with meningoencephalitis     #Monophasic demyelinating myeloencephalitis with deficits in mobility and ADLs: OOB, fall precautions  -Imaging significant for transverse myelitis s/p PLEX x 5 started on 2/3 with improvement noted per documentation  -B-1, B12, folic acid, Vitamin E  -Will recheck vitamin and mineral blood levels to assess need for supplementation moving forward  Neurology consulted to follow    #Neuro (Hx seizure dz, s/p craniotomy for meningioma resection): Continue Vimpat, Zonegran    #GI (s/p abdominal colectomy with ileorectal anastamosis 09/12/19 secondary to colonic inertia, GERD, esophageal stricture, IBS): Monitor stool output  Marinol for appetite stimulant  PO low fiber diet  Miralax PRN for constipation  Substituted individually ordered vitamins (Vit B-1, folic acid, iron) with Nephrovite to lessen pill burden and GI upset    #Hypercholesterolemia: Zetia    #Pain Management (Hx lumbar radiculopathy, fibromyalgia): Tylenol, Cymbalta, and Lidoderm patches    #Anemia: Monitor CBC  Continue iron and Vitamin C supplement    #DMII: Hypoglycemic on admission, SSI coverage only for now  Medicine consulted to follow    #Hypothyroidism: Synthroid    #DVT Prophylaxis: Use Lovenox    #Bowel/Bladder Management: PVRs to rule out urinary retention. Use Senna and Colace    #Skin Care: Keep skin clean and dry    #Psych: Psychology services for evaluation and treatment as needed for adjustment/coping     #FEN/GI: Regular diet. Nutrition consult  Protonix for GI prophylaxis    #Dispo: TBD pending rehab progress      Continue comprehensive and intensive inpatient rehab program, including:   Physical therapy 60-120 min daily, 5-6 times per week, Occupational therapy 60-120 min daily, 5-6 times per week, Case management and Rehabilitation nursing.  Psychology services to evaluate and treat as needed     Signed by: Cheryll Dessert MD  11/08/2019, 4:38 PM    [facility]  [facility]

## 2019-11-08 NOTE — Rehab Evaluation ([facility]) (Signed)
[name]  MRN: [id number]  Account: [id number]  Session Start: 11/07/2019 7:00:00 AM  Session Stop: 11/07/2019 8:00:00 AM    Total Treatment Minutes: 60.00 Minutes    Occupational Therapy  Inpatient Rehabilitation Evaluation    Rehab Diagnosis: Monophasic Demyelinating Myeloencephalitis  Demographics:            Age: 59Y            Gender: Female  Primary Language: [place]    Past Medical History: Interstitial Cystitis  DM  HTN  Hypothyroidism  Asthma  GERD/IBS  Seizures D/O  Fibromyalgia  DDD Lumbar Spine  Overactive Bladder  Chronic Constipation  GI bleed  Esophageal Stricture    PSH:  Appendectomy  Bladder sling  Hysterectomy  BS and ovarian removal  Craniotomy secondary to meningioma  Breast Bx's  Bilateral knee meniscus surgeries  Cataract Surgeries  Rhinoplasty  Rotator cuff surgery  History of Present Illness: Christy Mcintosh is a 59 year old female originally  admitted to [facility] in January 2021 with colonic  inertia.  She was taken to the OR on 09/12/19 for Colonic Inertia, S/P Total  abdominal Colectomy with ileorectal anastamosis. Post op complicated by ilieus  and was transferred to [facility] on 10/11/19 for further treatment of post op needs  and new dx of subacute progressive myelopathy requiring Neurology consult.  The  patient was also receiving TPN, now discontinued and the patient is tolerating  diet well with improved intake. On Marinol-appetite stimulant. Regular diet with  Ensure BID.+BM.  Regarding monophasic Autoimmune Demyelinating  Myeloencephalitis-presented with subacute progressive myelopathy on 10/03/19  with BLE paraparesis and hyporeflexia.  Imaging significant for transverse  myelitis with patchy, non diffusing T2 abnormalities involving the corticospinal  tracts.  Completed 5/5 PLEX started on 09/25/19 with improvement noted.  EMG  completed on 10/14/19 without s/o of neuropathy.  PM+R consult completed as well  as neurology consult.  The patient was also  followed for the following acute  medical conditions: UTI/Cystitis- Started on Nitrofurontinin SR 100 mg BID  Started on March 10th, myeloradicular lumbar back pain- continues on Tylenol,  Cymbalta, Robaxin, and Lidoderm patches, Anemia-Continue Iron and monitor H+H  6.  MRSA Precautions-Resolved, Testing negative from 10/23/19, pt removed from  serial precautions. Hx of DM, Continue Insulin as ordered. Seizure Disorder, Hx  of Craniotomy for Meningioma resection.  Continues on PO Vimpat.   Hypothyroidism-Continue on PO Synthroid.  The patient is alert and oriented x4,  pleasant and cooperative with care needs. Tolerating regular diet well, + BM.  Paraparesis improving and the patient is working well with therapy services with  progress made.  The patient has been working well with therapy services to  include PT and OT to address functional mobility, self care deficit, and pain.  The patient would benefit from an intensive level of rehab along with close  medical management of multiple medical conditions and rehab MD to drive the  rehab process.  The patient is medically stable for IRF level of care.   Date of Onset: 09/12/19   Date of Admission: 11/06/2019 5:56:00 PM  Premorbid Functional Level: Independent and driving  Social/Educational History: Pt previously worked as a Manufacturing systems engineer, however  retired s/p craniotomy. Pt is married to husband of nearly 40 years and has 4  adult children with 13 grandkids. Pt enjoys spending time with her grandkids and  cleaning.  Home Environment: The patient lives with her spouse in a 1 level home with  1 STE  in [place]. Has access to both tub/shower and walk-in shower, however primarily  uses walk-in. Has a shower chair.    Medications and Allergies: Significant rehabilitation considerations:   see EPIC  Rehabilitation Precautions/Restrictions:   Fall Precaution and Skin Precaution    SUBJECTIVE  Patient Report: "I woke up and couldn't move my legs"  Patient/Caregiver  Goals:  Patient's functional goals: "I want to get stronger  and go back home"  Pain: Patient currently without complaints of pain.  Pain Reassessment: Pain was not reassessed as no pain was reported.    OBJECTIVE  General Observation: OT orders received, chart reviewed, and RN approved pt for  OT evaluation. Pt received seated upright in bed, alert, and pleasant. No  significant s/s of distress or discomfort. Skin intact with no lines.  Understanding of Current Condition: good    Cognition: A/T/Ox4 with no significant cognitive deficits noted. Mild premorbid  memory deficits observed and reported.  Vision:  WNL  Perception: WNL    Upper Extremity Status   Tone: BUEs normotonic   Range of Motion: BUEs WNL   Fine Motor: BUEs WNL    Strength/Motor Control: Pt completes bed mobility independently, and all SPTs  with CGA. Ataxic and uncoordinated movement during ambulation with FWW requiring  steadying assist. Grossly 4/5 strength with greater weakness in LEs compared to  UEs. Pt reports premorbid mild L hemiparesis.  Sensation: Pt reports recent onset of neuropathy in LEs    Interventions:  Moderate Complexity Evaluation: An occupational profile and medical and therapy  history, which includes an expanded review of medical and/or therapy records and  additional review of physical, cognitive, or psychosocial history related to  current functional performance  An assessment(s) that identifies 3-5 performance deficits (i.e., relating to  physical, cognitive, or psychosocial skills) that result in activity limitations  and/or participation restrictions  Patient may present with comorbidities that affect occupational performance.  Minimal to moderate modification of tasks or assistance (i.e, physical or  verbal) with assessment(s) is necessary to enable patient to complete evaluation  component  Clinical decision-making of moderate analytic complexity, which includes an  analysis of the occupational profile, analysis of  data from detailed  assessment(s), and consideration of several treatment options No treatment  provided today.  Patient was left seated in chair in his/her room. Handoff to nurse completed.  Oriented to call bell and placed within reach.  Personal items within reach.  Assistive devices positioned out of reach.    Interdisciplinary Educational Needs and Learning Preferences:       Learning Preference: The patient's preferred learning method is:  Explanation.  The patient's preferred learning method is: Demonstration.  The patient's preferred learning method is: Programme researcher, broadcasting/film/video.       Barriers to Learning: Mobility.       Learning Needs: Debility, Equipment, Functional activities/mobility,  Leisure/vocational, Orthopedics, Plan of care, Rehabilitation techniques and  procedures, Safety    Education Provided: Plan of care. Rehab techniques and procedures. Role of OT, 3  hour rule, call light/chair alarms       Audience: Patient.       Mode: Explanation.       Response: Verbalized understanding.    ASSESSMENT  Summary of Deficits and Prognosis: Pt is a 59yo female admitted to [facility] acute  rehab s/p prolonged hospitalization with recent colectomy and subsequent LE  weakness impacting mobility and suspected to be related to an autoimmune  disorder (pending diagnosis). Pt previously independence with  all ADLs, IADLs,  and mobility tasks including driving in [place] with her husband, however medical  complications have recently resulted in generalized deconditioning, balance  impairments, and activity tolerance deficits. Pt's home is on one level and  husband is home during the day to assist as needed, as well as 3 of 4 adult  children in the area if necessary, however intends to discharge home to [facility],  therefore will benefit from skilled OT tx from an interdisciplinary perspective  in order to maximize safety and independence with daily routine.  Rehab Potential: Able to participate in an intensive inpatient  interdisciplinary  rehabilitation program, Good family/social support, Good premorbid functional  status, Living in the community premorbidly, Motivated  Barriers to Progress/Discharge: Poor activity tolerance, Medical condition    Short Term Goals:  Not applicable.  Long Term Goals:   Time frame to achieve long term goal(s):   7-10 days from IE (11/07/19)       1. Pt will complete ADL routine with mod I and DME/AE PRN       2. Pt will complete household transfers with mod I and LRAD       3. Pt will complete simple cleaning task with mod I and LRAD in order to  return to meaningful roles at home       4. Pt's husband will attend caregiver training and demonstrate independence  with providing necessary assist for safe discharge home.    CARE Tool  The scores below reflect the patient's usual function prior to therapeutic  intervention.    Self-Care Functional Assessment  Eating: 06 - Independent without an assistive device    Oral Hygiene: 04 - Supervision: Helper provides supervision for safety or verbal  cues ONLY  Assistive Device(s):   None  Context for oral hygiene:   Standing    Toileting Hygiene: 04 - Touching Assistance: Helper provides  touching/steadying/contact guard  Toileting Equipment:   Scientist, research (physical sciences) Device(s):   None    Shower/Bathe Self: 04 - Supervision: Helper provides supervision for safety or  verbal cues ONLY  Location:  Shower.  Assistive Device(s):   Grab bar/arm rest to maintain balance, Hand held shower,  Shower chair    Upper Body Dressing:   05 - Setup or Clean Up Assistance: Helper sets up or cleans up prior to or  following an activity  Assistive Device(s):   None    Lower Body Dressing: 04 - Touching Assistance: Helper provides  touching/steadying/contact guard  Assistive Device(s):   None    Putting On/Taking Off Footwear: 05 - Setup or Clean Up Assistance: Helper sets  up or cleans up prior to or following an activity  Assistive Device(s):   None    Mobility Functional  Assessment  Roll Left and Right: 06 - Independent without an assistive device    Lying to Sitting on Side of Bed: 04 - Supervision: Helper provides supervision  for safety or verbal cues ONLY  Assistive Device(s):   None    Sit to Stand: 04 - Touching Assistance: Helper provides  touching/steadying/contact guard  Assistive Device(s):   None    Chair/Bed-to-Chair Transfer: 04 - Touching Assistance: Helper provides  touching/steadying/contact guard  Assistive Device(s):   None    Toilet Transfer: 04 - Touching Assistance: Helper provides  touching/steadying/contact guard  Assistive Device(s):   Rolling walker    Picking Up Objects: 88 - Not attempted due to medical or safety concerns.    Self Care Discharge Goals:  Eating: Patient completed the activities by him/herself with no assistance from  a helper.   Oral Hygiene: Patient completed the activities by him/herself with no  assistance from a helper.   Toileting Hygiene: Patient completed the activities by him/herself with no  assistance from a helper.   Shower/Bathe Self: Patient completed the activities by him/herself with no  assistance from a helper.   Upper Body Dressing: Patient completed the activities by him/herself with no  assistance from a helper.   Lower Body Dressing: Patient completed the activities by him/herself with no  assistance from a helper.   Putting On/Taking Off Footwear: Patient completed the activities by him/herself  with no assistance from a helper.  Hearing, Speech, and Vision: Expression of Ideas and Wants: Expresses complex  messages without difficulty and with speech that is clear and easy to  understand.  Understanding Verbal and Non-Verbal Content: Understands: Clear comprehension  without cues or repetitions.    Risks/Benefits of Rehabilitation Discussed with Patient/Caregiver: Yes.  Recommendations/Goals for Rehabilitation Discussed with Patient/Caregiver: Yes.    PLAN  Occupational Therapy Plan: Occupational Therapy is  recommended.  Recommended Frequency/Duration/Intensity: 60-120 min/day for 5-6 days/wk for  7-10 days from IE (11/07/19)  Activities Contributing Toward Care Plan: Theract, therex, ADL/IADL retraining,  AE training, DME assessment/recommendation, modalities PRN, caregiver training,  fall prevention, household transfer training, discharge planning  Patient is appropriate for treatment in mobility group. Patient will benefit  from group therapy to increase aerobic endurance capacity for ambulating longer  distances, provide interaction with peers with similar diagnoses in order to  improve coping and therapeutic participation, receive support and constructive  criticism from peers in a group setting, practice and carry over skills learned  in individual sessions with new partners, clinicians and contexts    Team Care Plan  Please review Integrated Patient View Care Plan Flowsheet for Team identified  Problems, Interventions, and Goals.    Identified problems from team documentation:  Problem: Impaired Pain Management  Pain Mgmt: Primary Team Goal: Patient will verbalize pain less than or equal to  2 out of 10 with the use of medication and positioning techniques/    Problem: Impaired Self-care Mgmt/ADL/IADL  Self Care: Primary Team Goal: patient will require no more than minimum  assistance for bathing and dressing activities during hospitalization/    Problem: Impaired Skin/Wound Mgmt  Skin Wound: Primary Team Goal: Patient  will independently perform weight shifts  to prevent skin breakdown/    Problem: Safety Risk and Restraint  Safety: Primary Team Goal: Patient will follow safety precautions and call for  assistance 100% of the time during hospitalization/    Identified problems from this assessment:     Self Care Management : Pt will complete ADL routine and household transfers  with mod I    Discipline:  Occupational Therapy    3 Hour Rule Minutes: 60 minutes of OT treatment this session count towards  intensity  and duration of therapy requirement. Patient was seen for the full  scheduled time of OT treatment this session.  Therapy Mode Minutes: Individual: 60 minutes.    Signed by: [name], OTR/L 11/07/2019 8:00:00 AM

## 2019-11-08 NOTE — Rehab Progress Note ([facility]) (Signed)
Christy Mcintosh  MRN: [id number]  Account: [id number]  Session Start: 11/08/2019 12:00:00 AM  Session Stop: 11/08/2019 12:00:00 AM    Total Treatment Minutes:  Minutes    Rehabilitation Nursing  Inpatient Rehabilitation Shift Assessment    Rehab Diagnosis: Monophasic Demyelinating Myeloencephalitis  Demographics:            Age: 81Y            Gender: Female  Primary Language: English    Date of Onset:  09/12/19  Date of Admission: 11/06/2019 5:56:00 PM    Rehabilitation Precautions Restrictions:   Fall Precaution and Skin Precaution    Patient Report: "Okay"  Patient/Caregiver Goals:  "I want to get stronger and go back home"    Wounds/Incisions: No wounds or incisions.    Medication Review: No clinically significant medication issues identified this  shift.    Bowel and Bladder Output:                       Bladder (# only)             Bowel (# only)  Number of Episodes  Continent            2                            0  Incontinent          0                            0    Additional Education Provided:    Education Provided: Precautions. Pain management. Pain scale. Medication  options. Clinical indicators of pain. Plan of care. Safety issues and  interventions. Fall protocol. Medication. Name and dosage. Administration.  Purpose. Side Effects. Interaction.       Audience: Patient.       Mode: Explanation.  Teacher, English as a foreign language provided.       Response: Verbalized understanding.  Needs reinforcement.    TEAM CARE PLAN  Identified problems from team documentation:  Problem: Impaired Mobility  Mobility: Primary Team Goal: Pt will perform all mobility with ModI with LRAD  for safe D/C home./Active    Problem: Impaired Pain Management  Pain Mgmt: Primary Team Goal: Patient will verbalize pain less than or equal to  2 out of 10 with the use of medication and positioning techniques/    Problem: Impaired Self-care Mgmt/ADL/IADL  Self Care: Primary Team Goal: Pt will complete ADL routine and household  transfers with mod  I/    Problem: Impaired Skin/Wound Mgmt  Skin Wound: Primary Team Goal: Patient  will independently perform weight shifts  to prevent skin breakdown/    Problem: Safety Risk and Restraint  Safety: Primary Team Goal: Patient will follow safety precautions and call for  assistance 100% of the time during hospitalization/    Please review Integrated Patient View Care Plan Flowsheet for Team identified  Problems, Interventions, and Goals.    Signed by: Jacqualyn Posey, RN 11/08/2019 12:59:00 AM

## 2019-11-08 NOTE — Progress Notes (Signed)
INTERNAL MEDICINE PROGRESS NOTE  [facility], Division of Hospitalist Medicine  [facility]  [place] pager: [phone]      Date Time: 11/08/19 11:23 AM  Patient Name: Christy Mcintosh  Attending Physician: [name]*    Assessment:   Principal Problem:    Peripheral autoimmune demyelinating disease  Resolved Problems:    * No resolved hospital problems. *        Today Day # 2     Plan:     Subacute diarrhea sp resection   - add Cholestyramine BID .  - Low fiber diet .    History of DM type 2 , A1C 5   - BS 79-98 , will Craig   ISS .    HTN   - Losartan 25 mg .    Hyperlipidemia - Crestor 40 mg .    History of meningioma status post resection  -On Vimpat 150 mg twice daily.    Patient takes Adderall 20 mg and Cymbalta 60 mg daily.    Peripheral autoimmune demyelinating disease  - cw PT , OT  - Neurology consulted and following     Patient takes multiple supplements including thiamine, vitamin D, vitamin E .    Deep vein thrombosis prophylaxis Lovenox     Full Code  Supervise For Meals Frequency: All meals  ENSURE COMPACT SUPPLEMENT Quantity: A. One; Frequency: BID (2 times a day) - with Breakfast and Dinner  Diet low fiber Na restriction, if any: 2 GM NA; Additional restrictions: LOW LACTOSE, CONSISTENT CARBOHYDRATE; Patient preferences: No milk, ice cream, and cheese.  No eggs. Can tolerate yogurt.    Data Unavailable   @DISPOSITION @        Case discussed with: .nurse   Subjective/24 hour events:   No chief complaint on file.    Last 24 h : had 2 more BM yesterday   Today : . LOS: 2 days  today , she has not have any BM yet , still  Has gas and indigestion     d    Medications:   acetaminophen, 1,000 mg, TID  ALPRAZolam, 2 mg, QHS  amphetamine-dextroamphetamine, 20 mg, BID  b complex-vitamin c-folic acid, 1 tablet, Daily  cholestyramine, 1 packet, BID  dronabinol, 2.5 mg, Daily  DULoxetine, 60 mg, Daily  enoxaparin, 40 mg, Daily  ezetimibe, 10 mg, QHS  lacosamide, 150 mg,  BID  levothyroxine, 50 mcg, Daily at 0600  lidocaine, 1 patch, Q24H  losartan, 25 mg, Daily  pantoprazole, 40 mg, Daily at 0600  rosuvastatin, 40 mg, QHS  simethicone, 80 mg, TID  thiamine, 100 mg, Daily  vitamin B-12, 500 mcg, Daily  vitamin D (cholecalciferol), 25 mcg, Daily  vitamin E, 400 Units, Daily  zonisamide, 200 mg, BID      Current Facility-Administered Medications   Medication Dose Route   . acetaminophen  650 mg Oral   . dextrose  15 g of glucose Oral    And   . dextrose  12.5 g Intravenous    And   . glucagon (rDNA)  1 mg Intramuscular   . methocarbamol  750 mg Oral   . oxyCODONE  5 mg Oral   . polyethylene glycol  17 g Oral     Physical exam:   Temp:  [98.1 F (36.7 C)-98.4 F (36.9 C)] 98.2 F (36.8 C)  Heart Rate:  [76-86] 86  Resp Rate:  [17-20] 17  BP: (111-139)/(73-96) 111/78    Intake/Output Summary (Last 24  hours) at 11/08/2019 1123  Last data filed at 11/07/2019 2214  Gross per 24 hour   Intake 200 ml   Output 0 ml   Net 200 ml     General: Awake, alert, oriented, no apparent distress.  Cardiovascular: Regular rate and rhythm. No murmurs, gallops or rubs noted.  Lungs: Clear to auscultation bilaterally. No wheezing, crackles or rhonchi noted.  Abdomen: Soft, non-tender, non-distended. No organomegaly or masses noted. Normal bowel sounds. No guarding or rebound tenderness noted.  Extremities: No edema noted. 2+ pulses throughout.  Neuro: Non-focal neurological exam.        Labs (last 72 hours):     Recent Labs   Lab 11/07/19  0700   WBC 3.60   Hgb 9.0*   Hematocrit 28.2*   Platelets 391*     Recent Labs   Lab 11/07/19  0700   Sodium 142   Potassium 4.1   Chloride 108   CO2 25   BUN 10   Creatinine 0.8   Calcium 9.7   Albumin 4.6   Protein, Total 6.8   Bilirubin, Total 0.2   Alkaline Phosphatase 74   ALT 18   AST (SGOT) 17   Glucose 100           Radiology:     Radiology Results (24 Hour)     ** No results found for the last 24 hours. **          Signed by: Theresia Lo, MD   Date/time:  11/08/19 11:23 AM

## 2019-11-08 NOTE — Rehab Progress Note ([facility]) (Signed)
NAMEALEXANDER CRITCHLEY  MRN: [id number]  Account: [id number]  Session Start: 11/08/2019 3:00:00 PM  Session Stop: 11/08/2019 4:00:00 PM    Total Treatment Minutes: 60.00 Minutes    Physical Therapy  Inpatient Rehabilitation Treatment Note    Rehab Diagnosis: Monophasic Demyelinating Myeloencephalitis  Demographics:            Age: 5Y            Gender: Female  Rehabilitation Precautions/Restrictions:   Fall Precaution and Skin Precaution    OBJECTIVE  Vital Signs:                       Before Activity              After Activity  Vitals  BP Systolic          128                          133  BP Diastolic         86                           87  Pulse                84                           85  O2 Saturation        -                            100%    Interventions:       Therapeutic Activities:  Gait training to and from therapy with RW with  CGA.  GAit training without AD with mina for balance several losses of balance  requiring maxA, scissoring noted, up to 200' bouts.  Performed standing marching  and side stepping with mat for BUE support with CGA.  Applied 2# ankle weights  for gait training with improved control noted, still scissoring and narrow base  of support but improved.  Stair training up/down 12 steps with HR with minA for  balance, occ scissoring noted.  Patient was returned to or left in bed. Handoff to nurse completed.  Bed alarm in place and activated.  Oriented to call bell and placed within reach.  Personal items within reach.  Assistive devices positioned out of reach.  Bed placed in lowest position.    ASSESSMENT  Pt limited by decreased balance, motor control, decreased sensation, decreased  proprioception, decreased memory, and fatigue.  Pt has much more difficulty  without UE support.  Will continue to progress as tolerated for safe d/C.    3 Hour Rule Minutes: 60 minutes of PT treatment this session count towards  intensity and duration of therapy requirement. Patient was seen for the  full  scheduled time of PT treatment this session.  Therapy Mode Minutes: Individual: 60 minutes.    Signed by: [name], DPT 11/08/2019 4:00:00 PM

## 2019-11-08 NOTE — Progress Notes (Signed)
Christy Mcintosh  MRN: [id number]  Account: [id number]  Session Start: 11/08/2019 12:00:00 AM  Session Stop: 11/08/2019 12:00:00 AM    Total Treatment Minutes:  Minutes    Inpatient Rehabilitation Group Note    Rehab Diagnosis: Monophasic Demyelinating Myeloencephalitis  Demographics:            Age: 51Y            Gender: Female  Rehabilitation Precautions/Restrictions:   Fall Precaution and Skin Precaution    SUBJECTIVE  Patient Report: I've been a diabetic for a long time    OBJECTIVE  Group Type: Diabetes education group was held in dayroom setting.  Group Participant(s): Patient    Treatment Provided: What is diabetes, types of diabetes, signs /T/ symptoms  associated with diabetes, lifetime management, inspection of feet /T/ pressure  points, wearing proper socks/shoes, regular podiatry visits, when to contact a  physician, exercise and blood sugar control, hypoglycemia/hyperglycemia.,  insulin types/administration/management, blood sugar monitoring/management    ASSESSMENT  Participant(s) benefitted from support and encouragement of peers in a social  setting.  Participant(s) asked appropriate questions.  Participant(s) shared personal experiences.  Participant(s) had good participation.  Benefitted from psychosocial stimulation in a group setting  Socialized with peers and therapists present.    Signed by: Haze Boyden, RN 11/08/2019 3:15:00 PM

## 2019-11-09 DIAGNOSIS — E7800 Pure hypercholesterolemia, unspecified: Secondary | ICD-10-CM

## 2019-11-09 DIAGNOSIS — I10 Essential (primary) hypertension: Secondary | ICD-10-CM

## 2019-11-09 DIAGNOSIS — N3000 Acute cystitis without hematuria: Secondary | ICD-10-CM

## 2019-11-09 LAB — GLUCOSE WHOLE BLOOD - POCT
Whole Blood Glucose POCT: 95 mg/dL (ref 70–100)
Whole Blood Glucose POCT: 88 mg/dL (ref 70–100)
Whole Blood Glucose POCT: 88 mg/dL (ref 70–100)
Whole Blood Glucose POCT: 99 mg/dL (ref 70–100)

## 2019-11-09 MED ORDER — SENNOSIDES-DOCUSATE SODIUM 8.6-50 MG PO TABS
2.0000 | ORAL_TABLET | Freq: Two times a day (BID) | ORAL | Status: DC
Start: 2019-11-09 — End: 2019-11-11
  Administered 2019-11-09 – 2019-11-10 (×3): 2 via ORAL
  Filled 2019-11-09 (×5): qty 2

## 2019-11-09 MED ORDER — CEFUROXIME AXETIL 250 MG PO TABS
250.0000 mg | ORAL_TABLET | Freq: Two times a day (BID) | ORAL | Status: DC
Start: 2019-11-09 — End: 2019-11-10
  Administered 2019-11-09 – 2019-11-10 (×3): 250 mg via ORAL
  Filled 2019-11-09 (×3): qty 1

## 2019-11-09 MED ORDER — RISAQUAD PO CAPS
1.00 | ORAL_CAPSULE | Freq: Every day | ORAL | Status: DC
Start: 2019-11-09 — End: 2019-11-10
  Administered 2019-11-09 – 2019-11-10 (×2): 1 via ORAL
  Filled 2019-11-09 (×2): qty 1

## 2019-11-09 NOTE — Progress Notes (Signed)
INTERNAL MEDICINE PROGRESS NOTE  [facility], Division of Hospitalist Medicine  [facility]  [place] pager: [phone]      Date Time: 11/09/19 10:30 AM  Patient Name: Christy Mcintosh  Attending Physician: [name]*    Assessment:   Principal Problem:    Peripheral autoimmune demyelinating disease  Resolved Problems:    * No resolved hospital problems. *    59 year old right hand dominant female with PMHx lumbar radiculopathy, anemia, DMII, seizure disorder, hx craniotomy for meningioma resection on Vimpat, hypothyroidism, who was in her normal state of health until 08/2019 when she was admitted to [facility] with colonic inertia. She was taken to the OR on 09/12/19, and is now s/p total abdominal colectomy with ileorectal anastamosis. 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  She was transferred to [facility] on 10/11/2019; treatment appears to have included 5 plasma exchange treatment with significant improvement.  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    Deep vein thrombosis prophylaxis Lovenox     Subjective/24 hour events:   Chief complaint--status post treatment for demyelinating disease    HPI: Patient seen and examined.  Patient feels that the cholestyramine is causing constipation.  Patient denies any chest pain or shortness of breath.  Patient concerned about possible recurrent UTI.    Medications:   acetaminophen, 1,000 mg, TID  ALPRAZolam, 2 mg, QHS  amphetamine-dextroamphetamine, 20 mg, BID  b complex-vitamin c-folic acid, 1 tablet, Daily  cholestyramine, 1 packet, BID  dronabinol, 2.5 mg, Daily  DULoxetine, 60 mg, Daily  enoxaparin, 40 mg, Daily  ezetimibe, 10 mg, QHS  lacosamide, 150 mg, BID  levothyroxine, 50 mcg, Daily at 0600  lidocaine, 1 patch, Q24H  losartan, 25 mg, Daily  pantoprazole, 40 mg, Daily at 0600  rosuvastatin, 40 mg, QHS  simethicone, 80 mg, TID  thiamine, 100 mg, Daily  vitamin B-12, 500 mcg, Daily  vitamin D (cholecalciferol), 25 mcg, Daily  vitamin E, 400 Units, Daily  zonisamide, 200 mg, BID      Current Facility-Administered Medications   Medication Dose Route    acetaminophen  650 mg Oral    dextrose  15 g of glucose Oral    And    dextrose  12.5 g Intravenous    And    glucagon (rDNA)  1 mg Intramuscular    methocarbamol  750 mg Oral    oxyCODONE  5 mg Oral    polyethylene glycol  17 g Oral     Physical exam:   Temp:  [98.2 F (36.8 C)-98.8 F (37.1 C)] 98.8 F (37.1 C)  Heart Rate:  [81-89] 81  Resp Rate:  [16-17] 16  BP: (109-133)/(77-87) 120/85    Intake/Output Summary (Last 24 hours) at 11/09/2019 1030  Last data filed at 11/08/2019 2200  Gross per 24 hour   Intake 1020 ml   Output 1 ml   Net 1019 ml     General: Awake, alert, oriented, no apparent distress.  HEENT: Anicteric, moist mucous membranes  Neck: Supple, no JVD  Cardiovascular: Normal S1  and S2, no gallop or rub  Lungs: Clear to auscultation bilaterally. No wheezing or rhonchi noted; breathing comfortably with no use of accessory muscles  Abdomen: Soft, non-tender, non-distended, positive bowel sounds  Extremities: No edema noted, no cyanosis  Neuro: Alert and awake, moving all extremities    Labs (last 72 hours):     Recent Labs   Lab 11/07/19  0700   WBC 3.60   Hgb 9.0*   Hematocrit 28.2*   Platelets 391*     Recent Labs   Lab 11/07/19  0700   Sodium 142   Potassium 4.1   Chloride 108   CO2 25   BUN 10   Creatinine 0.8   Calcium 9.7   Albumin 4.6   Protein, Total 6.8   Bilirubin, Total 0.2   Alkaline Phosphatase 74   ALT 18   AST (SGOT) 17   Glucose 100           Radiology:     Radiology Results (24 Hour)     ** No results found for the last 24 hours. **          Signed by: Ilda Foil, MD   Date/time: 11/09/19 10:30 AM

## 2019-11-09 NOTE — Rehab Progress Note (Medilinks) (Signed)
Christy Mcintosh  MRN: [id number]  Account: [id number]  Session Start: 11/09/2019 12:00:00 AM  Session Stop: 11/09/2019 12:00:00 AM    Total Treatment Minutes:  Minutes    Rehabilitation Nursing  Inpatient Rehabilitation Shift Assessment    Rehab Diagnosis: Monophasic Demyelinating Myeloencephalitis  Demographics:            Age: 70Y            Gender: Female  Primary Language: English    Date of Onset:  09/12/19  Date of Admission: 11/06/2019 5:56:00 PM    Rehabilitation Precautions Restrictions:   Fall Precaution and Skin Precaution    Patient Report: " Iam doing fine."  Patient/Caregiver Goals:  "I want to get stronger and go back home"    Wounds/Incisions:       Old scars to lower back , abdomen, and right thigh.    Medication Review: No clinically significant medication issues identified this  shift.    Bowel and Bladder Output:                       Bladder (# only)             Bowel (# only)  Number of Episodes  Continent            2                            0  Incontinent          0                            0    Additional Education Provided:    Education Provided: Safety issues and interventions. Fall protocol. Supervision  requirements. Skin/wound care. Signs/symptoms of infection. Role of nutrition in  wound prevention/healing. Critical pressure areas. Skin care for the incontinent  patient. Prevention of skin breakdown. Medication. Name and dosage.  Administration. Purpose. Side Effects. Interaction. Anticoagulants. Diabetes.  Glucose testing. Diabetic foot care. Diet management. Hypoglycemia  causes/symptoms/treatment. Hyperglycemia causes/symptoms/treatment. Weight  management.       Audience: Patient.       Mode: Explanation.       Response: Verbalized understanding.    TEAM CARE PLAN  Identified problems from team documentation:  Problem: Impaired Mobility  Mobility: Primary Team Goal: Pt will perform all mobility with ModI with LRAD  for safe D/C home./Active    Problem: Impaired Pain  Management  Pain Mgmt: Primary Team Goal: Patient will verbalize pain less than or equal to  2 out of 10 with the use of medication and positioning techniques/    Problem: Impaired Self-care Mgmt/ADL/IADL  Self Care: Primary Team Goal: Pt will complete ADL routine and household  transfers with mod I/    Problem: Impaired Skin/Wound Mgmt  Skin Wound: Primary Team Goal: Patient  will independently perform weight shifts  to prevent skin breakdown/    Problem: Safety Risk and Restraint  Safety: Primary Team Goal: Patient will follow safety precautions and call for  assistance 100% of the time during hospitalization/    Please review Integrated Patient View Care Plan Flowsheet for Team identified  Problems, Interventions, and Goals.    Signed by: Liana Gerold, RN 11/09/2019 12:54:00 AM

## 2019-11-09 NOTE — Rehab Progress Note (Medilinks) (Signed)
NAMEJATERRIA NYLANDER  MRN: [id number]  Account: [id number]  Session Start: 11/09/2019 12:00:00 AM  Session Stop: 11/09/2019 12:00:00 AM    Total Treatment Minutes:  Minutes    Rehabilitation Nursing  Inpatient Rehabilitation Shift Assessment    Rehab Diagnosis: Monophasic Demyelinating Myeloencephalitis  Demographics:            Age: 31Y            Gender: Female  Primary Language: English    Date of Onset:  09/12/19  Date of Admission: 11/06/2019 5:56:00 PM    Rehabilitation Precautions Restrictions:   Fall Precaution and Skin Precaution    Patient Report: "I am fine"  Patient/Caregiver Goals:  "I want to get stronger and go back home"    Wounds/Incisions:       Old scars to lower back , abdomen, and right thigh.    Medication Review: No clinically significant medication issues identified this  shift.    Bowel and Bladder Output:                       Bladder (# only)             Bowel (# only)  Number of Episodes  Continent            2                            0  Incontinent          0                            0    Additional Education Provided:    Education Provided: Precautions. Safety issues and interventions. Fall protocol.  Supervision requirements. Use of adaptive devices. Pain management. Pain scale.  Medication options. Side effects. Clinical indicators of pain. Skin/wound care.  Signs/symptoms of infection. Role of nutrition in wound prevention/healing.  Critical pressure areas. Prevention of skin breakdown. Medication. Name and  dosage. Administration. Purpose. Side Effects. Administration of Lovenox  injections. Diabetes. Medication. Glucose testing. Insulin  drawing/mixing/administration. Skin care. Diet management. Hypoglycemia  causes/symptoms/treatment. Hyperglycemia causes/symptoms/treatment.       Audience: Patient.       Mode: Explanation.  Teacher, English as a foreign language provided.       Response: Verbalized understanding.    TEAM CARE PLAN  Identified problems from team documentation:  Problem: Impaired  Mobility  Mobility: Primary Team Goal: Pt will perform all mobility with ModI with LRAD  for safe D/C home./Active    Problem: Impaired Pain Management  Pain Mgmt: Primary Team Goal: Patient will verbalize pain less than or equal to  2 out of 10 with the use of medication and positioning techniques/    Problem: Impaired Self-care Mgmt/ADL/IADL  Self Care: Primary Team Goal: Pt will complete ADL routine and household  transfers with mod I/    Problem: Impaired Skin/Wound Mgmt  Skin Wound: Primary Team Goal: Patient  will independently perform weight shifts  to prevent skin breakdown/    Problem: Safety Risk and Restraint  Safety: Primary Team Goal: Patient will follow safety precautions and call for  assistance 100% of the time during hospitalization/    Please review Integrated Patient View Care Plan Flowsheet for Team identified  Problems, Interventions, and Goals.    Signed by: Ranae Pila, RN 11/09/2019 4:05:00 PM

## 2019-11-09 NOTE — Progress Notes (Signed)
[facility] Physiatry Attending Face to Face Progress  Note  [X] Discussed with nurse  .[x] No new events    Subjective:  Feels well;  C/o constipation x 2 days.     No headache. No shortness of breath. No chest pain.   Objective:  Vitals: BP 120/81   Pulse 87   Temp 98.8 F (37.1 C) (Oral)   Resp 16   Ht 1.6 m (5\' 3" )   Wt 63.6 kg (140 lb 3.4 oz)   SpO2 100%   BMI 24.84 kg/m   Appears well.In no apparent distress.Resting comfortably   Awake ; normal mood and affect.Sclerae anicteric.   Moist mucous membranes.   No respiratory distress .        New labs   Results     Procedure Component Value Units Date/Time    Glucose Whole Blood - POCT [[id number]] Collected: 11/09/19 1139     Updated: 11/09/19 1147     Whole Blood Glucose POCT 88 mg/dL     Glucose Whole Blood - POCT [[id number]] Collected: 11/09/19 0636     Updated: 11/09/19 0647     Whole Blood Glucose POCT 95 mg/dL     Glucose Whole Blood - POCT [[id number]]  (Abnormal) Collected: 11/08/19 2115     Updated: 11/08/19 2125     Whole Blood Glucose POCT 101 mg/dL     ADULT Urinalysis Reflex to Microscopic Exam - Reflex to Culture [[id number]]  (Abnormal) Collected: 11/08/19 1807     Updated: 11/08/19 1851     Urine Type Urine, Clean Ca     Color, UA Yellow     Clarity, UA Sl Cloudy     Specific Gravity UA 1.028     Urine pH 5.0     Leukocyte Esterase, UA Moderate     Nitrite, UA Negative     Protein, UR 30     Glucose, UA Negative     Ketones UA Negative     Urobilinogen, UA Negative mg/dL      Bilirubin, UA Large     Blood, UA Negative     WBC, UA 11 - 25 /hpf      Squamous Epithelial Cells, Urine 6 - 10 /hpf      Urine Mucus Present    Narrative:      Replace urinary catheter prior to obtaining the urine culture  if it has been in place for greater than or equal to 14  days:->N/A No Foley  Indications for U/A Reflex to Micro - Reflex to  Culture:->Suprapubic Pain/Tenderness or Dysuria          Medications:     Current Facility-Administered Medications   Medication  Dose Route Frequency   . acetaminophen  1,000 mg Oral TID   . ALPRAZolam  2 mg Oral QHS   . amphetamine-dextroamphetamine  20 mg Oral BID   . b complex-vitamin c-folic acid  1 tablet Oral Daily   . cefuroxime  250 mg Oral Q12H SCH   . dronabinol  2.5 mg Oral Daily   . DULoxetine  60 mg Oral Daily   . enoxaparin  40 mg Subcutaneous Daily   . ezetimibe  10 mg Oral QHS   . lacosamide  150 mg Oral BID   . lactobacillus/streptococcus  1 capsule Oral Daily   . levothyroxine  50 mcg Oral Daily at 0600   . lidocaine  1 patch Transdermal Q24H   . losartan  25 mg Oral Daily   . pantoprazole  40  mg Oral Daily at 0600   . rosuvastatin  40 mg Oral QHS   . senna-docusate  2 tablet Oral BID   . simethicone  80 mg Oral TID   . thiamine  100 mg Oral Daily   . vitamin B-12  500 mcg Oral Daily   . vitamin D (cholecalciferol)  25 mcg Oral Daily   . vitamin E  400 Units Oral Daily   . zonisamide  200 mg Oral BID       Medication Review  1. A complete drug regimen review was completed: YES  2. Were drug issues were found during review: NO   If yes to drug issues found during review:   What was the issue?:    What was the time the issue was identified?:    Was I contacted and action was taken by midnight of the next calendar day once issue was identified?:    Person who contacted me:    Action taken:       Assessment/Plan: 59 y.o. female  with dysfunction of mobility/ ADL due to Peripheral autoimmune demyelinating disease  Continue comprehensive intensive inpatient rehab program. Medically stable. Continue current management.     At least 15 minutes was spent with the patient in total.     pericolace BID, d/c metamucil.    Letta Moynahan, MD  PM&R

## 2019-11-09 NOTE — Rehab Progress Note (Medilinks) (Signed)
Christy Mcintosh  MRN: [id number]  Account: [id number]  Session Start: 11/09/2019 1:00:00 PM  Session Stop: 11/09/2019 2:00:00 PM    Total Treatment Minutes: 60.00 Minutes    Physical Therapy  Inpatient Rehabilitation Treatment Note    Rehab Diagnosis: Monophasic Demyelinating Myeloencephalitis  Demographics:            Age: 62Y            Gender: Female  Rehabilitation Precautions/Restrictions:   Fall risk  Skin and pressure ulcer precautions  B LE ataxia and scissoring with gait  Hx HTN, DM, Seizures    OBJECTIVE  Vital Signs:                       Before Activity              After Activity  Vitals  Time                 1300                         1352  Position/Activity    seated                       seated  BP Systolic          120                          121  BP Diastolic         81                           84  Pulse                87                           98    Interventions:       Therapeutic Activities:  RN, Nicoletta Dress, approved pt for PT session. Pt.  received in w/c, ready for PT. Pt. completed sit to stand with CGA/SBA to RW.  Pt. amb. 400 feet with RW with 2# wts on B ankles with CGA; amb. 200 feet with  RW with no wts with CGA; amb. 200 feet x 1 with SPC with no wts with CGA/min A;  and 200 feet without AD with CGA/min A, increasing veering and mild scissoring  without wts and without AD. Pt. worked on dynamic standing including amb. over  hurdles with SPC forward and laterally with CGA/min A, step taps on 4 inch and 8  inch steps without AD with CGA. Pt. completed 4 six inch steps x 3 with B rails  with CGA. Pt. back to bed with CGA/SBA at end of the session. All needs were  met.  Patient was returned to or left in bed. Handoff to nurse completed.  Bed alarm in place and activated.  Oriented to call bell and placed within reach.  Personal items within reach.  Assistive devices positioned out of reach.  Bed placed in lowest position.    ASSESSMENT  Pt. with good participation with PT, improving  gait with less scissoring and  increasing amb. distance. Pt. approaching green fall risk, not noted to stand  without waiting for A and pt. reports she will not get up without A. Cont with  rehab  program.    3 Hour Rule Minutes: 60 minutes of PT treatment this session count towards  intensity and duration of therapy requirement. Patient was seen for the full  scheduled time of PT treatment this session.  Therapy Mode Minutes: Individual: 60 minutes.    Signed by: Brand Males, PT 11/09/2019 2:00:00 PM

## 2019-11-09 NOTE — Rehab Progress Note (Medilinks) (Signed)
[name]  MRN: [id number]  Account: [id number]  Session Start: 11/09/2019 9:00:00 AM  Session Stop: 11/09/2019 10:00:00 AM    Total Treatment Minutes: 60.00 Minutes    Occupational Therapy  Inpatient Rehabilitation Treatment Note    Rehab Diagnosis: Monophasic Demyelinating Myeloencephalitis  Demographics:            Age: 76Y            Gender: Female  Rehabilitation Precautions/Restrictions:   Fall Precaution and Skin Precaution    Vital Signs:                       Before Activity              After Activity  Vitals  Position/Activity    See EPIC                     -    Interventions:       Therapeutic Activities:  Pt greeted seated in w/c in handoff from RN.  Session began by pt completing variety of stretching exercises to improve  flexibility and decrease pain. Pt completed trunk extension, trunk rotation,  trunk lateral flexion,  hamstring stretches, and hip stretches prior to  activity. Pt tolerated well.  Pt completed item retrieval acitivity using RW and walker basket. CGA-SPV to  retrieve items located from floor to shoulder height and hang them into a  closet. VC to improve BoS during turns with RW.  Pt then stood on blue balance mat while participating in functional reaching  activity from shoulder to floor height. No LOB noted. Pt then completed side  stepping onto blue mat with BUE progressing to no UE support. frequent Lob noted  with no UE support until OT provided VC to slow down and adjust footing prior to  stepping. Pt completed 4 trials R<>L without LOB. Pt amb back to room with VC to  limit scissioring while ambulating in more distractive enviornments.  Patient was returned to or left in bed. Handoff to nurse completed.  Bed alarm in place and activated.  Oriented to call bell and placed within reach.  Personal items within reach.  Assistive devices positioned out of reach.  Bed placed in lowest position.    ASSESSMENT  Pt demonstrates good righting reactions during LOB episodes  requiring no more  than CGA throughout balance activities. Pts demonstrates more scissoring while  distracted by talking or asking questions. Pt would benefit continued massed  practice of balance activities and functional mobility training in distracting  enviornments to simulate home/community enviornments in prep for d/c.    3 Hour Rule Minutes: 60 minutes of OT treatment this session count towards  intensity and duration of therapy requirement. Patient was seen for the full  scheduled time of OT treatment this session.  Therapy Mode Minutes: Individual: 60 minutes.    Signed by: Davonna Belling, OTR/L 11/09/2019 10:00:00 AM

## 2019-11-09 NOTE — Rehab Progress Note (Medilinks) (Signed)
NAMERUNELLE CHOP  MRN: [id number]  Account: [id number]  Session Start: 11/09/2019 8:00:00 AM  Session Stop: 11/09/2019 9:00:00 AM    Total Treatment Minutes: 60.00 Minutes    Speech Language Pathology  Inpatient Rehabilitation Treatment Note    Rehab Diagnosis: Monophasic Demyelinating Myeloencephalitis  Demographics:            Age: 3Y            Gender: Female  Rehabilitation Precautions/Restrictions:   Fall Precaution and Skin Precaution    Interventions:       Speech Treatment: SLP interventions targeted ongoing dynamic assessment of  cognitive-linguistic functions and training in compensatory cognitive  strategies.    Assessment of Language-Related Functional Activities (ALFA)  -Solving Daily Math Problems: 4/10 ? she required 14:45 minutes for task  completion. Score indicates a high probability that patient cannot independently  function on task.  -Understanding Medicine Labels: 7/10. Score indicates need for some level of  assistance on task.  -Reading Instructions: 6/10. Score indicates need for some level of assistance  on task.    Results of IE and ALFA were reviewed with the patient. She endorsed difficulties  with tasks. Patient reported that his spouse manages medications, finances, and  grocery shopping. She reports that does some cooking and manages her  appointments.    Patient was introduced to compensatory cognitive strategies for attention,  processing, memory, and executive functions. Strategies could not be practiced  this session d/t time constraints.  Patient was left seated in chair in his/her room. Handoff to nurse completed.  Chair alarm in place and activated.  Oriented to call bell and placed within reach.  Personal items within reach.  Assistive devices positioned out of reach.    ASSESSMENT  Based on ongoing assessment this session, patient presents with inefficient  information processing, requiring extra time for basic problem solving tasks.  She demonstrates breakdowns in problem  solving and executive functions. She will  likely require assistance with most IADLs upon d/c. She reports that she has  required assistance with many IADLs following meningioma resection. Based on  today?s performance, she would benefit from more than just a short bout of SLP  interventions as indicated during IE.    3 Hour Rule Minutes: 60 minutes of SLP treatment this session count towards  intensity and duration of therapy requirement. Patient was seen for the full  scheduled time of SLP treatment this session.  Therapy Mode Minutes: Individual: 60 minutes.    Signed by: Marcha Solders, M.S., CCC-SLP 11/09/2019 9:00:00 AM

## 2019-11-10 LAB — GLUCOSE WHOLE BLOOD - POCT
Whole Blood Glucose POCT: 110 mg/dL — ABNORMAL HIGH (ref 70–100)
Whole Blood Glucose POCT: 94 mg/dL (ref 70–100)
Whole Blood Glucose POCT: 83 mg/dL (ref 70–100)

## 2019-11-10 MED ORDER — FERROUS SULFATE 324 (65 FE) MG PO TBEC
324.00 mg | DELAYED_RELEASE_TABLET | Freq: Every morning | ORAL | Status: DC
Start: 2019-11-10 — End: 2019-11-16
  Administered 2019-11-10 – 2019-11-16 (×7): 324 mg via ORAL
  Filled 2019-11-10 (×7): qty 1

## 2019-11-10 NOTE — Progress Notes (Signed)
INTERNAL MEDICINE PROGRESS NOTE  [facility], Division of Hospitalist Medicine  [facility]  [place] pager: [phone]      Date Time: 11/10/19 9:49 AM  Patient Name: Christy Mcintosh  Attending Physician: [name]*    Assessment:   Principal Problem:    Peripheral autoimmune demyelinating disease  Resolved Problems:    * No resolved hospital problems. *    59 year old right hand dominant female with PMHx lumbar radiculopathy, anemia, DMII, seizure disorder, hx craniotomy for meningioma resection on Vimpat, hypothyroidism, who was in her normal state of health until 08/2019 when she was admitted to [facility] with colonic inertia. She was taken to the OR on 09/12/19, and is now s/p total abdominal colectomy with ileorectal anastamosis. 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  She was transferred to [facility] on 10/11/2019; treatment appears to have included 5 plasma exchange treatment with significant improvement.  Patient transferred to acute rehab on 3/17  Plan:   Autoimmune demyelinating disease s/p 5 treatments of plasma exchange at [facility]  -PT/OT as per rehab  -f/u any further neuro rec's    Pyuria--noted abnormal UA on 11/08/2019 but urine culture 3/19 with normal urogenital or skin microbiota  -d/c antibiotic/ceftin at this time    History of seizures  -Continue Vimpat and zonisamide    Status post recent total abdominal colectomy with ileorectal anastomosis for colonic inertia  -Monitor tolerance to current diet    History of DM type 2--HbA1C 5 0.0 on 3/18  -No further interventions at this time    HTN   -Continue losartan, follow blood pressure    Hyperlipidemia  -Continue Crestor and Zetia    Hypothyroidism  -Continue Synthroid    Normocytic anemia--suspect component of iron  deficiency  -Start iron supplement with TSAT 11% on 11/08/2019, check ferritin  -Noted normal B12 and folate levels on 3/19    Hx of narcolepsy?  -cont  patient's Adderall     Anxiety  -cont Cymbalta, also on evening xanax    Deep vein thrombosis prophylaxis with Lovenox     Subjective/24 hour events:   Chief complaint--status post treatment for demyelinating disease    HPI: Patient seen and examined.  Patient feeling okay presently.  Patient reports bowel movement this morning.  Patient denies any shortness of breath.    Medications:   acetaminophen, 1,000 mg, TID  ALPRAZolam, 2 mg, QHS  amphetamine-dextroamphetamine, 20 mg, BID  b complex-vitamin c-folic acid, 1 tablet, Daily  cefuroxime, 250 mg, Q12H SCH  dronabinol, 2.5 mg, Daily  DULoxetine, 60 mg, Daily  enoxaparin, 40 mg, Daily  ezetimibe, 10 mg, QHS  lacosamide, 150 mg, BID  lactobacillus/streptococcus, 1 capsule, Daily  levothyroxine, 50 mcg, Daily at 0600  lidocaine, 1 patch, Q24H  losartan, 25 mg, Daily  pantoprazole, 40 mg, Daily at 0600  rosuvastatin, 40 mg, QHS  senna-docusate, 2 tablet, BID  simethicone, 80 mg, TID  thiamine, 100 mg, Daily  vitamin B-12, 500 mcg, Daily  vitamin D (cholecalciferol), 25 mcg, Daily  vitamin E, 400 Units, Daily  zonisamide, 200 mg, BID      Current Facility-Administered Medications   Medication Dose Route    acetaminophen  650 mg Oral    dextrose  15 g of glucose Oral    And    dextrose  12.5 g Intravenous  And    glucagon (rDNA)  1 mg Intramuscular    methocarbamol  750 mg Oral    oxyCODONE  5 mg Oral    polyethylene glycol  17 g Oral     Physical exam:   Temp:  [98.2 F (36.8 C)-98.4 F (36.9 C)] 98.4 F (36.9 C)  Heart Rate:  [80-98] 80  Resp Rate:  [16-17] 16  BP: (111-126)/(76-84) 111/78    Intake/Output Summary (Last 24 hours) at 11/10/2019 0949  Last data filed at 11/09/2019 2200  Gross per 24 hour   Intake 1200 ml   Output 0 ml   Net 1200 ml     General: Awake, alert, oriented, no distress  HEENT:  Anicteric, moist mucous membranes  Neck: Supple, no JVD  Cardiovascular: Normal S1 and S2, no gallop or rub  Lungs: Clear to auscultation bilaterally. No wheezing or rhonchi noted; breathing comfortably with no use of accessory muscles  Abdomen: Soft, non-tender, non-distended, positive bowel sounds  Extremities: No edema noted, no cyanosis  Neuro: Alert and awake, moving all extremities    Labs (last 72 hours):     Recent Labs   Lab 11/07/19  0700   WBC 3.60   Hgb 9.0*   Hematocrit 28.2*   Platelets 391*     Recent Labs   Lab 11/07/19  0700   Sodium 142   Potassium 4.1   Chloride 108   CO2 25   BUN 10   Creatinine 0.8   Calcium 9.7   Albumin 4.6   Protein, Total 6.8   Bilirubin, Total 0.2   Alkaline Phosphatase 74   ALT 18   AST (SGOT) 17   Glucose 100           Radiology:     Radiology Results (24 Hour)     ** No results found for the last 24 hours. **          Signed by: Ilda Foil, MD   Date/time: 11/10/19 9:49 AM

## 2019-11-10 NOTE — Rehab Progress Note (Medilinks) (Signed)
[name]  MRN: [id number]  Account: [id number]  Session Start: 11/10/2019 12:00:00 AM  Session Stop: 11/10/2019 12:00:00 AM    Total Treatment Minutes:  Minutes    Rehabilitation Nursing  Inpatient Rehabilitation Shift Assessment    Rehab Diagnosis: Monophasic Demyelinating Myeloencephalitis  Demographics:            Age: 35Y            Gender: Female  Primary Language: English    Date of Onset:  09/12/19  Date of Admission: 11/06/2019 5:56:00 PM    Rehabilitation Precautions Restrictions:   Fall Precaution and Skin Precaution    Patient Report: " Iam ok"  Patient/Caregiver Goals:  "I want to get stronger and go back home"    Wounds/Incisions:       Old scar to lower back, abdomen and rt thigh healed.    Medication Review: No clinically significant medication issues identified this  shift.    Bowel and Bladder Output:                       Bladder (# only)             Bowel (# only)  Number of Episodes  Continent            2                            0  Incontinent          0                            0    Additional Education Provided:    Education Provided: Safety issues and interventions. Fall protocol. Supervision  requirements. Skin/wound care. Signs/symptoms of infection. Role of nutrition in  wound prevention/healing. Critical pressure areas. Skin care for the incontinent  patient. Prevention of skin breakdown. Medication. Name and dosage.  Administration. Purpose. Side Effects. Anticoagulants. Administration of Lovenox  injections. Pain management. Pain scale. Medication options. Side effects.  Clinical indicators of pain.       Audience: Patient.       Mode: Explanation.       Response: Verbalized understanding.    TEAM CARE PLAN  Identified problems from team documentation:  Problem: Impaired Mobility  Mobility: Primary Team Goal: Pt will perform all mobility with ModI with LRAD  for safe D/C home./Active    Problem: Impaired Pain Management  Pain Mgmt: Primary Team Goal: Patient will verbalize  pain less than or equal to  2 out of 10 with the use of medication and positioning techniques/    Problem: Impaired Self-care Mgmt/ADL/IADL  Self Care: Primary Team Goal: Pt will complete ADL routine and household  transfers with mod I/    Problem: Impaired Skin/Wound Mgmt  Skin Wound: Primary Team Goal: Patient  will independently perform weight shifts  to prevent skin breakdown/    Problem: Safety Risk and Restraint  Safety: Primary Team Goal: Patient will follow safety precautions and call for  assistance 100% of the time during hospitalization/    Please review Integrated Patient View Care Plan Flowsheet for Team identified  Problems, Interventions, and Goals.    Signed by: Christy Gerold, RN 11/10/2019 2:05:00 AM

## 2019-11-10 NOTE — Rehab Progress Note (Medilinks) (Signed)
NAMEMCKINZY FULLER  MRN: [id number]  Account: [id number]  Session Start: 11/10/2019 12:00:00 AM  Session Stop: 11/10/2019 12:00:00 AM    Total Treatment Minutes:  Minutes    Rehabilitation Nursing  Inpatient Rehabilitation Shift Assessment    Rehab Diagnosis: Monophasic Demyelinating Myeloencephalitis  Demographics:            Age: 10Y            Gender: Female  Primary Language: English    Date of Onset:  09/12/19  Date of Admission: 11/06/2019 5:56:00 PM    Rehabilitation Precautions Restrictions:   Fall Precaution and Skin Precaution    Patient Report: "I feel like having a BM"  Patient/Caregiver Goals:  "I want to get stronger and go back home"    Wounds/Incisions:       Old scars to lower back , abdomen, and right thigh.    Medication Review: No clinically significant medication issues identified this  shift.    Bowel and Bladder Output:                       Bladder (# only)             Bowel (# only)  Number of Episodes  Continent            3                            2  Incontinent          0                            0    Additional Education Provided:    Education Provided: Precautions. Precautions. Safety issues and interventions.  Fall protocol. Supervision requirements. Use of adaptive devices. Pain  management. Pain scale. Medication options. Side effects. Clinical indicators of  pain. Pain management. Pain scale. Medication options. Side effects. Clinical  indicators of pain. Skin/wound care. Signs/symptoms of infection. Critical  pressure areas. Prevention of skin breakdown. Medication. Name and dosage.  Administration. Purpose. Side Effects. Interaction. Administration of Lovenox  injections.       Audience: Patient.       Mode: Explanation.  Teacher, English as a foreign language provided.       Response: Verbalized understanding.    TEAM CARE PLAN  Identified problems from team documentation:  Problem: Impaired Mobility  Mobility: Primary Team Goal: Pt will perform all mobility with ModI with LRAD  for safe D/C  home./Active    Problem: Impaired Pain Management  Pain Mgmt: Primary Team Goal: Patient will verbalize pain less than or equal to  2 out of 10 with the use of medication and positioning techniques/    Problem: Impaired Self-care Mgmt/ADL/IADL  Self Care: Primary Team Goal: Pt will complete ADL routine and household  transfers with mod I/    Problem: Impaired Skin/Wound Mgmt  Skin Wound: Primary Team Goal: Patient  will independently perform weight shifts  to prevent skin breakdown/    Problem: Safety Risk and Restraint  Safety: Primary Team Goal: Patient will follow safety precautions and call for  assistance 100% of the time during hospitalization/    Please review Integrated Patient View Care Plan Flowsheet for Team identified  Problems, Interventions, and Goals.    Signed by: Ranae Pila, RN 11/10/2019 3:38:00 PM

## 2019-11-11 LAB — CBC
Absolute NRBC: 0 10*3/uL (ref 0.00–0.00)
Hematocrit: 28.7 % — ABNORMAL LOW (ref 34.7–43.7)
Hgb: 9.3 g/dL — ABNORMAL LOW (ref 11.4–14.8)
MCH: 31.3 pg (ref 25.1–33.5)
MCHC: 32.4 g/dL (ref 31.5–35.8)
MCV: 96.6 fL — ABNORMAL HIGH (ref 78.0–96.0)
MPV: 10.2 fL (ref 8.9–12.5)
Nucleated RBC: 0 /100 WBC (ref 0.0–0.0)
Platelets: 303 10*3/uL (ref 142–346)
RBC: 2.97 10*6/uL — ABNORMAL LOW (ref 3.90–5.10)
RDW: 16 % — ABNORMAL HIGH (ref 11–15)
WBC: 3.61 10*3/uL (ref 3.10–9.50)

## 2019-11-11 LAB — BASIC METABOLIC PANEL
Anion Gap: 11 (ref 5.0–15.0)
BUN: 17 mg/dL (ref 7–19)
CO2: 24 mEq/L (ref 22–29)
Calcium: 10 mg/dL (ref 8.5–10.5)
Chloride: 108 mEq/L (ref 100–111)
Creatinine: 0.7 mg/dL (ref 0.6–1.0)
Glucose: 74 mg/dL (ref 70–100)
Potassium: 4.1 mEq/L (ref 3.5–5.1)
Sodium: 143 mEq/L (ref 136–145)

## 2019-11-11 LAB — HEMOLYSIS INDEX: Hemolysis Index: 20 — ABNORMAL HIGH (ref 0–18)

## 2019-11-11 LAB — VITAMIN E: Vitamin E (Alpha Tocopherol): 17.1 mg/L — ABNORMAL HIGH (ref 5.5–17.0)

## 2019-11-11 LAB — GFR: EGFR: >60

## 2019-11-11 LAB — FERRITIN: Ferritin: 32.15 ng/mL (ref 4.60–204.00)

## 2019-11-11 MED ORDER — SIMETHICONE 80 MG PO CHEW
80.00 mg | CHEWABLE_TABLET | Freq: Four times a day (QID) | ORAL | Status: DC | PRN
Start: 2019-11-11 — End: 2019-11-16
  Administered 2019-11-11 – 2019-11-16 (×10): 80 mg via ORAL
  Filled 2019-11-11 (×10): qty 1

## 2019-11-11 MED ORDER — SENNOSIDES-DOCUSATE SODIUM 8.6-50 MG PO TABS
2.0000 | ORAL_TABLET | Freq: Two times a day (BID) | ORAL | Status: DC | PRN
Start: 2019-11-11 — End: 2019-11-16

## 2019-11-11 MED ORDER — GABAPENTIN 300 MG PO CAPS
300.00 mg | ORAL_CAPSULE | Freq: Three times a day (TID) | ORAL | Status: DC
Start: 2019-11-11 — End: 2019-11-16
  Administered 2019-11-11 – 2019-11-16 (×15): 300 mg via ORAL
  Filled 2019-11-11 (×15): qty 1

## 2019-11-11 NOTE — Progress Notes (Signed)
PHYSICAL MEDICINE AND REHABILITATION  PROGRESS NOTE  FACE TO Bruce Donath    Date Time: 11/11/19 3:28 PM    Patient Name: Christy Mcintosh, Christy Mcintosh  Admission date:  11/06/2019  (LOS: 5 days)    Subjective:     Patient seen at lunch time  Complains of paraesthesia of hands and feet, will start on gabapentin     Functional Status:     Interventions:       Speech Treatment: SLP intervention targeted information processing and  organization strategy generation. Primary SLP began working with patient on this  date, obtained information regarding personal therapy goals and d/c plan. Pt  plans to return to hometown [facility] following [place] d/c with spouse. She wishes to  complete household tasks independently has system in place for medications and  spouse manages finances.       Speech Treatment: Patient has two smart phones that are with her at all  times, and currently utilizes for camera and phone only. No calendar / schedule  routine in place prior to this hospitalization. SLP and patient focused on  strategy training in context of Android calendar. With moderate prompting /  guiding cues from SLP, pt created two events in calendar documenting  name/time/place with slight extended time. She benefitted from task breakdown  into 1-2 components in order to aid with information processing.    Medications:   Medication reviewed by me:     Scheduled Meds: PRN Meds:    acetaminophen, 1,000 mg, Oral, TID  ALPRAZolam, 2 mg, Oral, QHS  amphetamine-dextroamphetamine, 20 mg, Oral, BID  b complex-vitamin c-folic acid, 1 tablet, Oral, Daily  dronabinol, 2.5 mg, Oral, Daily  DULoxetine, 60 mg, Oral, Daily  enoxaparin, 40 mg, Subcutaneous, Daily  ezetimibe, 10 mg, Oral, QHS  ferrous sulfate, 324 mg, Oral, QAM W/BREAKFAST  gabapentin, 300 mg, Oral, Q8H SCH  lacosamide, 150 mg, Oral, BID  levothyroxine, 50 mcg, Oral, Daily at 0600  lidocaine, 1 patch, Transdermal, Q24H  losartan, 25 mg, Oral, Daily  pantoprazole, 40 mg, Oral, Daily at  0600  rosuvastatin, 40 mg, Oral, QHS  thiamine, 100 mg, Oral, Daily  vitamin B-12, 500 mcg, Oral, Daily  vitamin D (cholecalciferol), 25 mcg, Oral, Daily  vitamin E, 400 Units, Oral, Daily  zonisamide, 200 mg, Oral, BID        Continuous Infusions:   acetaminophen, 650 mg, Q6H PRN  dextrose, 15 g of glucose, PRN   And  dextrose, 12.5 g, PRN   And  glucagon (rDNA), 1 mg, PRN  methocarbamol, 750 mg, TID PRN  oxyCODONE, 5 mg, Q6H PRN  polyethylene glycol, 17 g, Daily PRN  senna-docusate, 2 tablet, BID PRN  simethicone, 80 mg, Q6H PRN          Medication Review  1. A complete drug regimen review was completed: Yes  2. Were drug issues were found during review: No   If yes to drug issues found during review:   What was the issue?:    What was the time the issue was identified?:    Was I contacted and action was taken by midnight of the next calendar day once issue was identified?: N/A    Person who contacted me:    Action taken:     Review of Systems:   A comprehensive review of systems was: No fevers, chills, nausea, vomiting, chest pain, shortness of breath, cough, headache, double vision.  All others negative.    Labs:     Recent Labs  Lab 11/11/19  0523 11/07/19  0700   WBC 3.61 3.60   Hgb 9.3* 9.0*   Hematocrit 28.7* 28.2*   Platelets 303 391*      Recent Labs   Lab 11/11/19  0523 11/07/19  0700   Sodium 143 142   Potassium 4.1 4.1   Chloride 108 108   CO2 24 25   BUN 17 10   Creatinine 0.7 0.8   Calcium 10.0 9.7   Albumin  --  4.6   Protein, Total  --  6.8   Bilirubin, Total  --  0.2   Alkaline Phosphatase  --  74   ALT  --  18   AST (SGOT)  --  17   Glucose 74 100         Rads:   Radiological Procedure reviewed.  Radiology Results (24 Hour)     ** No results found for the last 24 hours. **        Physical Exam:     Vitals:    11/10/19 2218 11/11/19 0558 11/11/19 0923 11/11/19 1510   BP: 138/84 109/76 117/83 122/80   Pulse: 96 86  94   Resp: 18 17     Temp: 98.8 F (37.1 C) 98.1 F (36.7 C)     TempSrc:  Oral Oral     SpO2: 100% 100%     Weight:       Height:           Intake and Output Summary (Last 24 hours) at Date Time    Intake/Output Summary (Last 24 hours) at 11/11/2019 1528  Last data filed at 11/11/2019 1339  Gross per 24 hour   Intake 860 ml   Output 0 ml   Net 860 ml     P.O.: 240 mL (11/11/19 1339)     Urine: 0 mL (11/11/19 0000)             Cardiac: regular rhythm  Chest / Lungs:  ctax2  Abdomen:  +BS  Extremities: no calf tenderness.    Assessment and Plan:     59 year old female patient with meningoencephalitis     #Monophasic demyelinating myeloencephalitis with deficits in mobility and ADLs: OOB, fall precautions  -Imaging significant for transverse myelitis s/p PLEX x 5 started on 2/3 with improvement noted per documentation  -B-1, B12, folic acid, Vitamin E  -Will recheck vitamin and mineral blood levels to assess need for supplementation moving forward  Neurology consulted to follow    #Neuro (Hx seizure dz, s/p craniotomy for meningioma resection): Continue Vimpat, Zonegran    #GI (s/p abdominal colectomy with ileorectal anastamosis 09/12/19 secondary to colonic inertia, GERD, esophageal stricture, IBS): Monitor stool output  Marinol for appetite stimulant  PO low fiber diet  Miralax PRN for constipation  Substituted individually ordered vitamins (Vit B-1, folic acid, iron) with Nephrovite to lessen pill burden and GI upset    #Hypercholesterolemia: Zetia    #Pain Management (Hx lumbar radiculopathy, fibromyalgia): Tylenol, Cymbalta, and Lidoderm patches    #Anemia: Monitor CBC  Continue iron and Vitamin C supplement    #DMII: Hypoglycemic on admission, SSI coverage only for now  Medicine consulted to follow    #Hypothyroidism: Synthroid    #DVT Prophylaxis: Use Lovenox    #Bowel/Bladder Management: PVRs to rule out urinary retention. Use Senna and Colace    #Skin Care: Keep skin clean and dry    #Psych: Psychology services for evaluation and treatment as needed for  adjustment/coping      #FEN/GI: Regular diet. Nutrition consult  Protonix for GI prophylaxis    #Dispo: TBD pending rehab progress      Continue comprehensive and intensive inpatient rehab program, including:   Physical therapy 60-120 min daily, 5-6 times per week, Occupational therapy 60-120 min daily, 5-6 times per week, Case management and Rehabilitation nursing.  Psychology services to evaluate and treat as needed     Signed by: Cheryll Dessert MD  11/11/2019, 3:28 PM    [facility]

## 2019-11-11 NOTE — Rehab PSY Consult ([facility]) (Addendum)
Corrected 11/15/2019 11:39:21 AM    NAME: ERICHA Mcintosh  MRN: [id number]  Account: [id number]  Session Start: 11/11/2019 10:00:00 AM  Session Stop: 11/11/2019 11:00:00 AM    Total Treatment Minutes: 60.00 Minutes    Psychology Services  Inpatient Rehabilitation Consultation    Rehab Diagnosis: Monophasic Demyelinating Myeloencephalitis  Demographics:            Age: 59Y            Gender: Female    Past Medical History: Interstitial Cystitis  DM  HTN  Hypothyroidism  Asthma  GERD/IBS  Seizures D/O  Fibromyalgia  DDD Lumbar Spine  Overactive Bladder  Chronic Constipation  GI bleed  Esophageal Stricture    PSH:  Appendectomy  Bladder sling  Hysterectomy  BS and ovarian removal  Craniotomy secondary to meningioma  Breast Bx's  Bilateral knee meniscus surgeries  Cataract Surgeries  Rhinoplasty  Rotator cuff surgery  History of Present Illness: Christy Mcintosh is a 59 year old female originally  admitted to [facility] in January 58 2021 with colonic  inertia.  She was taken to the OR on 09/12/19 for Colonic Inertia, S/P Total  abdominal Colectomy with ileorectal anastamosis. Post op complicated by ilieus  and was transferred to [facility] on 10/11/19 for further treatment of post op needs  and new dx of subacute progressive myelopathy requiring Neurology consult.  The  patient was also receiving TPN, now discontinued and the patient is tolerating  diet well with improved intake. On Marinol-appetite stimulant. Regular diet with  Ensure BID.+BM.  Regarding monophasic Autoimmune Demyelinating  Myeloencephalitis-presented with subacute progressive myelopathy on 10/03/19  with BLE paraparesis and hyporeflexia.  Imaging significant for transverse  myelitis with patchy, non diffusing T2 abnormalities involving the corticospinal  tracts.  Completed 5/5 PLEX started on 09/25/19 with improvement noted.  EMG  completed on 10/14/19 without s/o of neuropathy.  PM+R consult completed as well  as neurology consult.  The patient  was also followed for the following acute  medical conditions: UTI/Cystitis- Started on Nitrofurontinin SR 100 mg BID  Started on March 10th, myeloradicular lumbar back pain- continues on Tylenol,  Cymbalta, Robaxin, and Lidoderm patches, Anemia-Continue Iron and monitor H+H  6.  MRSA Precautions-Resolved, Testing negative from 10/23/19, pt removed from  serial precautions. Hx of DM, Continue Insulin as ordered. Seizure Disorder, Hx  of Craniotomy for Meningioma resection.  Continues on PO Vimpat.   Hypothyroidism-Continue on PO Synthroid.  The patient is alert and oriented x4,  pleasant and cooperative with care needs. Tolerating regular diet well, + BM.  Paraparesis improving and the patient is working well with therapy services with  progress made.  The patient has been working well with therapy services to  include PT and OT to address functional mobility, self care deficit, and pain.  The patient would benefit from an intensive level of rehab along with close  medical management of multiple medical conditions and rehab MD to drive the  rehab process.  The patient is medically stable for IRF level of care.            Date of Onset: 09/12/19            Date of Admission: 11/06/2019 5:56:00 PM    Medications and Allergies: Significant rehabilitation considerations:   see EPIC  Rehabilitation Precautions/Restrictions:   Fall Precaution and Skin Precaution    OBJECTIVE  Education Level:  Some college  Behavioral Observations and Mental Status:  Patient seen for initial  psychological consultation to assess for adjustment to medical situation and  rehabilitation hospitalization in accordance with the attending physician?s  initial plan of care. Christy Mcintosh was awake, alert, and cooperative throughout  the session. She presented sitting upright in her hospital bed resting  comfortably. Affect was WNL. Eye contact was good. Thought process was linear  and focused; content was appropriate. Speech was of normal volume,  rate, and  prosody.  Processing speed was slow. There were no behavioral signs of pain  during the assessment and the patient denied significant pain at the time of  this assessment. Patient was oriented to person, place, time, and situation.  Mood was reported as ?good.? There was no evidence of response to  auditory/visual stimuli or apparent psychosis. Patient denies any suicidal  ideation, intent, or plan.    Interdisciplinary Educational Needs and Learning Preferences:       Learning Preference: The patient's preferred learning method is:  Explanation.  The patient's preferred learning method is: Demonstration.  The patient's preferred learning method is: Programme researcher, broadcasting/film/video.       Barriers to Learning: Cognitive limitations.       Learning Needs: Plan of care, Rehabilitation techniques and procedures    Education Provided: No education provided this session.    Interventions:   NPSY HLTH ASSESS/REASSES    ASSESSMENT  Impressions:  Christy Mcintosh reported a history of adjusting to the hospital setting  for 3 months and initial distress with health complications following her  scheduled surgery, which include difficulty walking. She continues to report  stress related to walking complications. She stated that she had largely  adjusted to changes in the hospital and is optimistic about her rehabilitation.  She also described increased functioning in her legs since her initial  difficulties with walking. On a cognitive screener (SLUMS) she scored in the  impaired range (15/30) with difficulties in processing speed, memory, and  executive functioning. This may in part be related to cognitive declines prior  to her hospitalization. She benefitted from repeating instructions, cueing, and  giving her extra time to process.  She denied other symptoms of psychopathology.    Potential to Benefit: Able to participate in an intensive inpatient  interdisciplinary rehabilitation program, Good family/social support, Living in  the  community premorbidly, Motivated  Barriers to Progress/Discharge: Functional status    Goals:  Time frame to achieve short term goal(s): 2 weeks       1. Maintain healthy adjustment to the rehabilitation process and decrease  stress related to functional changes.    PLAN  Psychology services are recommended to address: Maintain healthy adjustment to  the rehabilitation process and decrease stress related to functional changes.  Recommendations:  Individual and co-treat  as needed      Care Plan  Identified problems from team documentation:  Problem: Impaired Mobility  Mobility: Primary Team Goal: Pt will perform all mobility with ModI with LRAD  for safe D/C home./Active    Problem: Impaired Pain Management  Pain Mgmt: Primary Team Goal: Patient will verbalize pain less than or equal to  2 out of 10 with the use of medication and positioning techniques/    Problem: Impaired Self-care Mgmt/ADL/IADL  Self Care: Primary Team Goal: Pt will complete ADL routine and household  transfers with mod I/    Problem: Impaired Skin/Wound Mgmt  Skin Wound: Primary Team Goal: Patient  will independently perform weight shifts  to prevent skin breakdown/    Problem: Safety Risk and Restraint  Safety: Primary Team Goal: Patient will follow safety precautions and call for  assistance 100% of the time during hospitalization/    Identified problems from this assessment:     Psychosocial : Maintain healthy adjustment to the rehabilitation process and  decrease stress related to functional changes.    Discipline:  Neuropsychology/Psychology    Please review Integrated Patient View Care Plan Flowsheet for Team identified  Problems, Interventions, and Goals.    Signed by: Raynald Blend, PhD 11/11/2019 11:00:00 AM

## 2019-11-11 NOTE — Rehab Progress Note ([facility]) (Signed)
Christy Mcintosh  MRN: [id number]  Account: [id number]  Session Start: 11/11/2019 12:00:00 AM  Session Stop: 11/11/2019 12:00:00 AM    Total Treatment Minutes:  Minutes    Rehabilitation Nursing  Inpatient Rehabilitation Shift Assessment    Rehab Diagnosis: Monophasic Demyelinating Myeloencephalitis  Demographics:            Age: 69Y            Gender: Female  Primary Language: English    Date of Onset:  09/12/19  Date of Admission: 11/06/2019 5:56:00 PM    Rehabilitation Precautions Restrictions:   Fall Precaution and Skin Precaution    Patient Report: "Am fine"  Patient/Caregiver Goals:  "I want to get stronger and go back home"    Wounds/Incisions:       Old scars to lower back , abdomen, and right thigh    Medication Review: No clinically significant medication issues identified this  shift.    Bowel and Bladder Output:                       Bladder (# only)             Bowel (# only)  Number of Episodes  Continent            2                            0  Incontinent          0                            0    Additional Education Provided:    Education Provided: Precautions. Pain management. Pain scale. Medication  options. Side effects. Clinical indicators of pain. Safety. Medication. Name and  dosage. Administration. Purpose. Side Effects. Interaction.       Audience: Patient.       Mode: Explanation.       Response: Verbalized understanding.    TEAM CARE PLAN  Identified problems from team documentation:  Problem: Impaired Mobility  Mobility: Primary Team Goal: Pt will perform all mobility with ModI with LRAD  for safe D/C home./Active    Problem: Impaired Pain Management  Pain Mgmt: Primary Team Goal: Patient will verbalize pain less than or equal to  2 out of 10 with the use of medication and positioning techniques/    Problem: Impaired Self-care Mgmt/ADL/IADL  Self Care: Primary Team Goal: Pt will complete ADL routine and household  transfers with mod I/    Problem: Impaired Skin/Wound Mgmt  Skin Wound:  Primary Team Goal: Patient  will independently perform weight shifts  to prevent skin breakdown/    Problem: Safety Risk and Restraint  Safety: Primary Team Goal: Patient will follow safety precautions and call for  assistance 100% of the time during hospitalization/    Please review Integrated Patient View Care Plan Flowsheet for Team identified  Problems, Interventions, and Goals.    Signed by: Renae Fickle, RN 11/11/2019 1:32:00 AM

## 2019-11-11 NOTE — Progress Notes (Signed)
INTERNAL MEDICINE PROGRESS NOTE  [facility], Division of Hospitalist Medicine  [facility]  [place] pager: [phone]      Date Time: 11/11/19 9:12 AM  Patient Name: Christy Mcintosh  Attending Physician: [name]*    Assessment:   Principal Problem:    Peripheral autoimmune demyelinating disease  Resolved Problems:    * No resolved hospital problems. *    59 year old right hand dominant female with PMHx lumbar radiculopathy, anemia, DMII, seizure disorder, hx craniotomy for meningioma resection on Vimpat, hypothyroidism, who was in her normal state of health until 08/2019 when she was admitted to [facility] with colonic inertia. She was taken to the OR on 09/12/19, and is now s/p total abdominal colectomy with ileorectal anastamosis. 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  She was transferred to [facility] on 10/11/2019; treatment appears to have included 5 plasma exchange treatment with significant improvement.  Patient transferred to acute rehab on 3/17  Plan:   Autoimmune demyelinating disease s/p 5 treatments of plasma exchange at [facility]  -PT/OT as per rehab  -f/u any further neuro rec's    Pyuria--noted abnormal UA on 11/08/2019 but urine culture 3/19 with normal urogenital or skin microbiota  -No further antibiotics at this time    History of seizures  -Continue Vimpat and zonisamide    Status post recent total abdominal colectomy with ileorectal anastomosis for colonic inertia  -Monitor tolerance to current diet; change Peri-Colace from scheduled dosing to as needed as patient reports she is having excessive loose stools  -Add as needed simethicone as patient reports some gaseous discomfort which had previously improved with simethicone    History of DM type 2--HbA1C 5 0.0 on  3/18  -No further interventions at this time    HTN   -Continue losartan, follow blood pressure    Hyperlipidemia  -Continue Crestor and Zetia    Hypothyroidism  -Continue Synthroid    Normocytic anemia--suspect component of iron deficiency  -Start iron supplement with TSAT 11% on 11/08/2019, check ferritin  -Noted normal B12 and folate levels on 3/19    Hx of narcolepsy?  -cont Adderall     Anxiety  -cont Cymbalta, also on evening xanax    Deep vein thrombosis prophylaxis with Lovenox     Subjective/24 hour events:   Chief complaint--status post treatment for demyelinating disease    HPI: Patient seen and examined.  Patient reports some gaseous abdominal discomfort.  She notes that she was previously on scheduled simethicone which assisted with the symptoms and is asking about a dose of simethicone for her symptoms.  Patient reports several loose bowel movements over past 24 hours and believes that her bowel regimen needs to be adjusted.    Medications:   acetaminophen, 1,000 mg, TID  ALPRAZolam, 2 mg, QHS  amphetamine-dextroamphetamine, 20 mg, BID  b complex-vitamin c-folic acid, 1 tablet, Daily  dronabinol, 2.5 mg, Daily  DULoxetine, 60 mg, Daily  enoxaparin, 40 mg, Daily  ezetimibe, 10 mg, QHS  ferrous sulfate, 324 mg, QAM W/BREAKFAST  lacosamide, 150 mg, BID  levothyroxine, 50 mcg, Daily at 0600  lidocaine, 1 patch, Q24H  losartan, 25 mg, Daily  pantoprazole, 40 mg, Daily at 0600  rosuvastatin, 40 mg, QHS  senna-docusate, 2 tablet, BID  thiamine, 100 mg, Daily  vitamin B-12, 500 mcg, Daily  vitamin D (cholecalciferol), 25  mcg, Daily  vitamin E, 400 Units, Daily  zonisamide, 200 mg, BID      Current Facility-Administered Medications   Medication Dose Route    acetaminophen  650 mg Oral    dextrose  15 g of glucose Oral    And    dextrose  12.5 g Intravenous    And    glucagon (rDNA)  1 mg Intramuscular    methocarbamol  750 mg Oral    oxyCODONE  5 mg Oral    polyethylene glycol  17 g Oral     simethicone  80 mg Oral     Physical exam:   Temp:  [98.1 F (36.7 C)-98.8 F (37.1 C)] 98.1 F (36.7 C)  Heart Rate:  [86-96] 86  Resp Rate:  [16-18] 17  BP: (109-138)/(76-84) 109/76    Intake/Output Summary (Last 24 hours) at 11/11/2019 0912  Last data filed at 11/11/2019 0000  Gross per 24 hour   Intake 620 ml   Output 0 ml   Net 620 ml     General: Awake, alert, oriented, no distress  HEENT: Anicteric, moist mucous membranes  Neck: Supple, no JVD  Cardiovascular: Normal S1 and S2, no gallop or rub  Lungs: Clear to auscultation bilaterally. No wheezing or rhonchi noted; breathing comfortably with no use of accessory muscles  Abdomen: Soft, non-tender, non-distended, positive bowel sounds  Extremities: No edema noted, no cyanosis  Neuro: Alert and awake, moving all extremities    Labs (last 72 hours):     Recent Labs   Lab 11/11/19  0523 11/07/19  0700   WBC 3.61 3.60   Hgb 9.3* 9.0*   Hematocrit 28.7* 28.2*   Platelets 303 391*     Recent Labs   Lab 11/11/19  0523 11/07/19  0700   Sodium 143 142   Potassium 4.1 4.1   Chloride 108 108   CO2 24 25   BUN 17 10   Creatinine 0.7 0.8   Calcium 10.0 9.7   Albumin  --  4.6   Protein, Total  --  6.8   Bilirubin, Total  --  0.2   Alkaline Phosphatase  --  74   ALT  --  18   AST (SGOT)  --  17   Glucose 74 100           Radiology:     Radiology Results (24 Hour)     ** No results found for the last 24 hours. **          Signed by: Ilda Foil, MD   Date/time: 11/11/19 9:12 AM

## 2019-11-11 NOTE — Progress Notes (Signed)
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  Feels well, but states that she is still not able to walk properly in terms include tingling numbness in both feet, and tips of her hands    Review of Systems:   No headache, eye, ear nose, throat problems; no coughing or wheezing or shortness of breath, No chest pain or orthopnea, no abdominal pain, nausea or vomiting, No pain in the body or extremities, no psychiatric, neurological, endocrine, hematological or cardiac complaints except as noted above.     Physical Exam:   BP 117/83   Pulse 86   Temp 98.1 F (36.7 C) (Oral)   Resp 17   Ht 1.6 m (5\' 3" )   Wt 63.6 kg (140 lb 3.4 oz)   SpO2 100%   BMI 24.84 kg/m   *Awake alert oriented x3 fluent smile symmetric eye movements intact pupils equal reactive no drift moving both arms and legs well no tremor finger-nose performed quite well excellent strength noted  Meds:      Scheduled Meds: PRN Meds:    acetaminophen, 1,000 mg, Oral, TID  ALPRAZolam, 2 mg, Oral, QHS  amphetamine-dextroamphetamine, 20 mg, Oral, BID  b complex-vitamin c-folic acid, 1 tablet, Oral, Daily  dronabinol, 2.5 mg, Oral, Daily  DULoxetine, 60 mg, Oral,  Daily  enoxaparin, 40 mg, Subcutaneous, Daily  ezetimibe, 10 mg, Oral, QHS  ferrous sulfate, 324 mg, Oral, QAM W/BREAKFAST  lacosamide, 150 mg, Oral, BID  levothyroxine, 50 mcg, Oral, Daily at 0600  lidocaine, 1 patch, Transdermal, Q24H  losartan, 25 mg, Oral, Daily  pantoprazole, 40 mg, Oral, Daily at 0600  rosuvastatin, 40 mg, Oral, QHS  thiamine, 100 mg, Oral, Daily  vitamin B-12, 500 mcg, Oral, Daily  vitamin D (cholecalciferol), 25 mcg, Oral, Daily  vitamin E, 400 Units, Oral, Daily  zonisamide, 200 mg, Oral, BID        Continuous Infusions:   acetaminophen, 650 mg, Q6H PRN  dextrose, 15 g of glucose, PRN   And  dextrose, 12.5 g, PRN   And  glucagon (rDNA), 1 mg, PRN  methocarbamol, 750 mg, TID PRN  oxyCODONE, 5 mg, Q6H PRN  polyethylene glycol, 17 g, Daily PRN  senna-docusate, 2 tablet, BID PRN  simethicone, 80 mg, Q6H PRN                Labs:     Recent Labs   Lab 11/11/19  0523 11/07/19  0700   Glucose 74 100   BUN 17 10   Creatinine 0.7 0.8   Calcium 10.0 9.7  Sodium 143 142   Potassium 4.1 4.1   Chloride 108 108   CO2 24 25   Albumin  --  4.6   AST (SGOT)  --  17   ALT  --  18   Bilirubin, Total  --  0.2   Alkaline Phosphatase  --  74     Recent Labs   Lab 11/11/19  0523 11/07/19  0700   WBC 3.61 3.60   Hgb 9.3* 9.0*   Hematocrit 28.7* 28.2*   MCV 96.6* 97.2*   MCH 31.3 31.0   MCHC 32.4 31.9   Platelets 303 391*         No results for input(s): PTT, PT, INR in the last 72 hours.       Radiology Results (24 Hour)     ** No results found for the last 24 hours. **           All brain imaging (MRI, CT) personally reviewed.    Case discussed with:pt       This note was generated by the Epic EMR system/ Dragon speech recognition and may contain inherent errors or omissions not intended by the user. Grammatical errors, random word insertions, deletions and pronoun errors  are occasional consequences of this technology due to software limitations.     Not all errors are caught or corrected. If there are questions  or concerns about the content of this note or information contained within the body of this dictation they should be addressed directly with the author for clarification.      Signed by: Cathe Mons, MD  Spectralink: [id number]      Answering Service: [phone]

## 2019-11-11 NOTE — Rehab Progress Note ([facility]) (Signed)
[name]  MRN: [id number]  Account: [id number]  Session Start: 11/11/2019 11:00:00 AM  Session Stop: 11/11/2019 12:00:00 PM    Total Treatment Minutes: 60.00 Minutes    Occupational Therapy  Inpatient Rehabilitation Treatment Note    Rehab Diagnosis: Monophasic Demyelinating Myeloencephalitis  Demographics:            Age: 58Y            Gender: Female  Rehabilitation Precautions/Restrictions:   Fall Precaution and Skin Precaution  Scissoring gait.    Vital Signs: see EPIC.    Interventions:       Therapeutic Activities:  Patient reports desire to address functional  mobility and balance therefore patient participates in Nustep (level 10 at /R/30  SPM for 6 minutes) as preparatory activity and sensory input prior to dual  tasking mobility with rolling walker. Patient able to maintain balance while  walking despite distracting environment today, however does require CGA during  pivot to sit in chairs x3. She also participates in wide side stepping and calf  raises to promote balance and proprioception in BLEs.  Patient was left seated in chair in his/her room. Handoff to nurse completed.  Chair alarm in place and activated.  Oriented to call bell and placed within reach.  Personal items within reach.    ASSESSMENT  Trial compression and sensory loading prior to functional mobility? and continue  VC's for safety during position changes.    3 Hour Rule Minutes: 60 minutes of OT treatment this session count towards  intensity and duration of therapy requirement. Patient was seen for the full  scheduled time of OT treatment this session.  Therapy Mode Minutes: Individual: 60 minutes.    Signed by: Elsie Amis, OTR/L 11/11/2019 12:00:00 PM

## 2019-11-11 NOTE — Rehab Progress Note ([facility]inks) (Signed)
Christy Mcintosh  MRN: [id number]  Account: [id number]  Session Start: 11/11/2019 12:00:00 AM  Session Stop: 11/11/2019 12:00:00 AM    Total Treatment Minutes:  Minutes    Rehabilitation Nursing  Inpatient Rehabilitation Shift Assessment    Rehab Diagnosis: Monophasic Demyelinating Myeloencephalitis  Demographics:            Age: 48Y            Gender: Female  Primary Language: English    Date of Onset:  09/12/19  Date of Admission: 11/06/2019 5:56:00 PM    Rehabilitation Precautions Restrictions:   Fall Precaution and Skin Precaution    Patient Report: I am happy  Patient/Caregiver Goals:  "I want to get stronger and go back home"    Wounds/Incisions: No wounds or incisions.    Medication Review: No clinically significant medication issues identified this  shift.    Bowel and Bladder Output:                       Bladder (# only)             Bowel (# only)  Number of Episodes  Continent            3                            2    Additional Education Provided:    Education Provided: Precautions. Plan of care. Safety issues and interventions.  Fall protocol. Medication. Name and dosage. Administration. Purpose. Side  Effects.       Audience: Patient.       Mode: Explanation.       Response: Needs reinforcement.    TEAM CARE PLAN  Identified problems from team documentation:  Problem: Impaired Mobility  Mobility: Primary Team Goal: Pt will perform all mobility with ModI with LRAD  for safe D/C home./Active    Problem: Impaired Pain Management  Pain Mgmt: Primary Team Goal: Patient will verbalize pain less than or equal to  2 out of 10 with the use of medication and positioning techniques/    Problem: Impaired Psychosocial Skills/Behavior  PsychoSocial: Primary Team Goal: Maintain healthy adjustment to the  rehabilitation process and decrease stress related to functional changes./    Problem: Impaired Self-care Mgmt/ADL/IADL  Self Care: Primary Team Goal: Pt will complete ADL routine and household  transfers with mod  I/    Problem: Impaired Skin/Wound Mgmt  Skin Wound: Primary Team Goal: Patient  will independently perform weight shifts  to prevent skin breakdown/    Problem: Safety Risk and Restraint  Safety: Primary Team Goal: Patient will follow safety precautions and call for  assistance 100% of the time during hospitalization/    Please review Integrated Patient View Care Plan Flowsheet for Team identified  Problems, Interventions, and Goals.    Signed by: Natasha Bence, RN 11/11/2019 5:55:00 PM

## 2019-11-11 NOTE — Rehab Progress Note ([facility]) (Signed)
NAME: Christy Mcintosh  MRN: [id number]  Account: [id number]  Session Start: 11/11/2019 3:00:00 PM  Session Stop: 11/11/2019 4:00:00 PM    Total Treatment Minutes: 60.00 Minutes    Physical Therapy  Inpatient Rehabilitation Progress Note    Rehab Diagnosis: Monophasic Demyelinating Myeloencephalitis  Demographics:            Age: 39Y            Gender: Female  Rehabilitation Precautions/Restrictions:   Fall Precaution and Skin Precaution    SUBJECTIVE  Patient Report: Where did you go out of town to? (perseverated on this)  - pt  redirected to therapy as she was talking about another therapist on vacation  Patient/Caregiver Goals:  "I want to get stronger and go back home"  Pain: Patient currently without complaints of pain.    OBJECTIVE  Vital Signs:                       Before Activity              After Activity  Vitals  BP Systolic          122                          119  BP Diastolic         80                           86  Pulse                94                           96      Interventions:       Therapeutic Activities:  Reviewed care tool areas.  Friend present during  session.  Reviewed D/C planning and AD training.  Patient was returned to or left in bed. Handoff to nurse completed.  Bed alarm in place and activated.  Oriented to call bell and placed within reach.  Personal items within reach.  Assistive devices positioned out of reach.  Bed placed in lowest position.    Pain Reassessment: Pain was not reassessed as no pain was reported.    Lower Extremity Orthosis: Patient does not present with foot drop or ankle  instability and does not need an orthotic.  Equipment Provided: The patient has been given voluntary selection of a medical  equipment provider, as per federal law. Arrangements have been made with a  company of the patient?s choosing. The patient has been educated that choosing a  company outside of [employer]?s preferred provider may affect their  insurance coverage.The patient has  been provided with information on care of  equipment as appropriate.    Education Provided:    Education Provided: Fall prevention/balance training. Gait.       Audience: Patient.       Mode: Explanation.  Demonstration.  Teacher, English as a foreign language provided.       Response: Applied knowledge.  Verbalized understanding.  Demonstrated skill.  Needs practice.  Needs reinforcement.    ASSESSMENT  Pt continues to be limited by decreased coordination, scissoring during gait,  decreased balance, decreased memory, processing, and parasthesias.  Pt continues  to progress well with gait with RW, tends to be S level.  Gaits without AD with  CGA/minA.  Will continue to progress as tolerated for safe D/C.    CARE Tool  Mobility Functional Assessment  Roll Left and Right: 06 - Independent without an assistive device    Sit to Lying: 06 - Independent without an assistive device    Lying to Sitting on Side of Bed: 06 - Independent without an assistive device    Sit to Stand: 04 - Supervision: Helper provides supervision for safety or verbal  cues ONLY  Assistive Device(s):   Bed rail    Chair/Bed-to-Chair Transfer: 04 - Touching Assistance: Helper provides  touching/steadying/contact guard  Assistive Device(s):   Arm rest, Bed rails    Car Transfer: 04 - Touching Assistance: Helper provides  touching/steadying/contact guard    Walk 10 Feet: 04 - Touching Assistance: Helper provides  touching/steadying/contact guard    Walk 50 Feet with Two Turns: 03 - Partial/Moderate Assistance: Helper does 1-25%  of the activity   minA without AD, S with RW    Walk 150 Feet:  03 - Partial/Moderate Assistance: Helper does 1-25% of the  activity   minA without AD, S with RW    Walking 10 Feet on Uneven Surface: 03 - Partial/Moderate Assistance: Helper does  1-25% of the activity   minA without AD, S with RW    1 Step (curb):  03 - Partial/Moderate Assistance: Helper does 1-25% of the  activity   with 1 HR    4 Steps:  03 - Partial/Moderate Assistance: Helper  does 1-25% of the activity   with 1 HR    12 Steps: 03 - Partial/Moderate Assistance: Helper does 1-25% of the activity   with 1HR    Wheelchair: Patient does not use a wheelchair or scooter.    Long Term Goals: Pt will be I with stand pivot transfers with LRAD.  Pt will gait 150' with ModI with LRAD.  Pt will negotiate up/down 1 step with LRAD with ModI to enter home.  7-10days from IE on 11/07/19  Short Term Goals:    Progress Towards Goals: Pt is progressing well with goals, no goals met at this  time.    PLAN  Continued Physical Therapy is recommended.  Recommended Frequency/Duration/Intensity: 1-2hrs/day, 5-6days/week for 7-10days  from IE on 11/07/19  Activities Contributing Toward Care Plan: neuro re-ed, bed mobility, transfers,  gait, stairs, AD training, ther ex/act, balance, D/C planning, groups as  appropriate  Patient is appropriate for treatment in gait group. Patient will benefit from  group therapy to increase aerobic endurance capacity for ambulating longer  distances, to improve safety with use of appropriate AD while dividing  attention, improve negotiation of environmental barriers in a distracting  environment to simulate interaction with others in the community, provide  interaction with peers with similar diagnoses in order to improve coping and  therapeutic participation, enhance ability to direct care with caregivers,  integrate and carry over functional social-pragmatic language skills, practice  communication and/or functional cognitive skills in a setting that mimics a real  world environment (e.g., work, outing with friends)., receive support and  constructive criticism from peers in a group setting, practice and carry over  skills learned in individual sessions with new partners, clinicians and  contexts, practice applying strategies to manage communication or cognitive  challenges in a distracting environment, experience modelling by peers    Care Plan  Identified problems from team  documentation:  Problem: Impaired Mobility  Mobility: Primary Team Goal: Pt will perform all mobility with ModI with LRAD  for safe D/C home./Active    Problem: Impaired Pain Management  Pain Mgmt: Primary Team Goal: Patient will verbalize pain less than or equal to  2 out of 10 with the use of medication and positioning techniques/    Problem: Impaired Psychosocial Skills/Behavior  PsychoSocial: Primary Team Goal: Maintain healthy adjustment to the  rehabilitation process and decrease stress related to functional changes./    Problem: Impaired Self-care Mgmt/ADL/IADL  Self Care: Primary Team Goal: Pt will complete ADL routine and household  transfers with mod I/    Problem: Impaired Skin/Wound Mgmt  Skin Wound: Primary Team Goal: Patient  will independently perform weight shifts  to prevent skin breakdown/    Problem: Safety Risk and Restraint  Safety: Primary Team Goal: Patient will follow safety precautions and call for  assistance 100% of the time during hospitalization/    Add/Update Problems from this Treatment:  Update of existing problem:   Mobility: Pt performs stand pivot transfers, gaits without AD 150' , and  negotiates up/down 12 steps with 1 HR with CGA/minA.    Discipline:  Physical Therapy    Please review Integrated Patient View Care Plan Flowsheet for Team identified  Problems, Interventions, and Goals.    3 Hour Rule Minutes: 60 minutes of PT treatment this session count towards  intensity and duration of therapy requirement. Patient was seen for the full  scheduled time of PT treatment this session.  Therapy Mode Minutes: Individual: 60 minutes.    Signed by: [name], DPT 11/11/2019 4:00:00 PM

## 2019-11-11 NOTE — Rehab Progress Note ([facility]) (Signed)
NAMEKATALIA Mcintosh  MRN: [id number]  Account: [id number]  Session Start: 11/11/2019 8:00:00 AM  Session Stop: 11/11/2019 9:00:00 AM    Total Treatment Minutes: 60.00 Minutes    Speech Language Pathology  Inpatient Rehabilitation Treatment Note    Rehab Diagnosis: Monophasic Demyelinating Myeloencephalitis  Demographics:            Age: 75Y            Gender: Female  Rehabilitation Precautions/Restrictions:   Fall Precaution and Skin Precaution    Interventions:       Speech Treatment: SLP intervention targeted information processing and  organization strategy generation. Primary SLP began working with patient on this  date, obtained information regarding personal therapy goals and d/c plan. Pt  plans to return to hometown [facility] following [place] d/c with spouse. She wishes to  complete household tasks independently has system in place for medications and  spouse manages finances.       Speech Treatment: Patient has two smart phones that are with her at all  times, and currently utilizes for camera and phone only. No calendar / schedule  routine in place prior to this hospitalization. SLP and patient focused on  strategy training in context of Android calendar. With moderate prompting /  guiding cues from SLP, pt created two events in calendar documenting  name/time/place with slight extended time. She benefitted from task breakdown  into 1-2 components in order to aid with information processing.    Patient was returned to or left in bed. Handoff to nurse completed.  Oriented to call bell and placed within reach.  Personal items within reach.    ASSESSMENT  Cueing remained around moderate assist in order to consistently carryover task  instructions. SLP provided pt with simplified version of calendar task as  homework in order to gain additional practice in errorless learning setting.    3 Hour Rule Minutes: 60 minutes of SLP treatment this session count towards  intensity and duration of therapy requirement. Patient  was seen for the full  scheduled time of SLP treatment this session.  Therapy Mode Minutes: Individual: 60 minutes.    Signed by: [name], M.A., CCC/SLP 11/11/2019 9:00:00 AM

## 2019-11-12 ENCOUNTER — Inpatient Hospital Stay: Payer: Self-pay

## 2019-11-12 NOTE — Rehab Progress Note ([facility]) (Signed)
Christy Mcintosh  MRN: [id number]  Account: [id number]  Session Start: 11/12/2019 9:00:00 AM  Session Stop: 11/12/2019 10:00:00 AM    Total Treatment Minutes: 60.00 Minutes    Speech Language Pathology  Inpatient Rehabilitation Progress Note    Rehab Diagnosis: Monophasic Demyelinating Myeloencephalitis  Demographics:            Age: 89Y            Gender: Female  Rehabilitation Precautions/Restrictions:   Fall Precaution and Skin Precaution    SUBJECTIVE  Patient Report: Appreciate handoff from RN, reporting patient is now safety  GREEN status. SLP reinforced meaning of green status with patient, stressing  importance of continued use of call bell.  Patient/Caregiver Goals:  "I want to get stronger and go back home"  Pain: Patient currently without complaints of pain.    OBJECTIVE    Interventions:       Speech Treatment: SLP intervention addressed executive functions in context  of daily scheduling. She is ambulating to speech offices at close supervision  level while utilizing walker as an AD. No cueing provided to maintain safety  during short distance walk.       Speech Treatment: Patient completed homework assignment from yesterday  ([employer] Calendar events) prior to today?s session >80% acc.  She continued to  demonstrate functional carryover of newly learned calendar use by generating two  events provided incidental verbal cues. Teach/back approach incorporated in  order to reinforce calendar sequencing. SLP facilitated increased organization  while teaching highlighting strategy of written information. Pt highlighted  pertinent information on medicine labels, ie med frequency, provided max  decreased to min verbal cues by rep #4. She answered 4/4 simple problem solving  questions related to medication labels without cueing.  Patient was left seated in chair in his/her room. Handoff to nurse completed.  Oriented to call bell and placed within reach.  Personal items within reach.  Pain Reassessment: Pain was  not reassessed as no pain was reported.    Education Provided:    Education Provided: Compensatory cognitive strategies/aids.       Audience: Patient.       Mode: Explanation.       Response: Verbalized understanding.    ASSESSMENT  SLP intervention has addressed executive functions strategies such as  highlighting pertinent information, incorporating teach / back and self talk for  multi step sequencing and utilizing smart phone external aids (ie calendar) in  order to maximize independence in IADLs. SLP has provided range of min to max  cueing in order to maintain consistency of strategy use, although functional new  learning observed by end of most tasks.Patient continues to demonstrate gains in  functional cognitive based tasks, slowly decreasing level of assistance from  initial evaluation.    Long Term Goals: Pt will utilize compensatory cognitive strategies in order to  aide new learning of medical and rehab education given only min cues from  therapist/caregiver.  Pt will successfully adopt strategies (e.g., re-reading, underlining,  re-checking) in order to better process, organize, and plan information in more  complex contexts (med management, financial affairs, appointment scheduling)  given only min cues from therapist/caregiver.  7-10 days from IE  Short Term Goals:    Progress Toward Goals:     LONG TERM GOAL REVIEW:       1. Pt will utilize compensatory cognitive strategies in order to aide new  learning of medical and rehab education given only min cues from  therapist/caregiver. -  Not Met: Range of min to max in unfamiliar tasks,  although cueing consistently decreases near task completion.  Time frame to achieve long term goal(s):  7-10 days from IE    PLAN  Continued Speech Language Pathology is recommended to address:  Recommended Frequency/Duration/Intensity: 60-120 mins of skilled SLP services  5-6 days/week for 7-10 days  Continued Activities Contributing Toward Care Plan:  cognitive-linguistic  retraining, practice of compensatory cognitive strategies, application of word  finding strategies, language stimulation, caregiver training, and d/c planning  The patient is appropriate for group treatment. Patient will benefit from group  therapy to practice and carry over skills learned in individual sessions with  new partners, clinicians and contexts    Care Plan  Identified problems from team documentation:  Problem: Impaired Mobility  Mobility: Primary Team Goal: Pt will perform all mobility with ModI with LRAD  for safe D/C home./Active    Problem: Impaired Pain Management  Pain Mgmt: Primary Team Goal: Patient will verbalize pain less than or equal to  2 out of 10 with the use of medication and positioning techniques/    Problem: Impaired Psychosocial Skills/Behavior  PsychoSocial: Primary Team Goal: Maintain healthy adjustment to the  rehabilitation process and decrease stress related to functional changes./    Problem: Impaired Self-care Mgmt/ADL/IADL  Self Care: Primary Team Goal: Pt will complete ADL routine and household  transfers with mod I/    Problem: Impaired Skin/Wound Mgmt  Skin Wound: Primary Team Goal: Patient  will independently perform weight shifts  to prevent skin breakdown/    Problem: Safety Risk and Restraint  Safety: Primary Team Goal: Patient will follow safety precautions and call for  assistance 100% of the time during hospitalization/    Add/Update Problems from this Treatment:  Update of existing problem:   Cognition: Pt utilizes compensatory cognitive strategies provided range of min  to max in unfamiliar tasks, although cueing consistently decreases near task  completion.    Discipline:  Speech/Language Pathology    Please review Integrated Patient View Care Plan Flowsheet for Team identified  Problems, Interventions, and Goals.    3 Hour Rule Minutes: 60 minutes of SLP treatment this session count towards  intensity and duration of therapy requirement.  Patient was seen for the full  scheduled time of SLP treatment this session.  Therapy Mode Minutes: Individual: 60 minutes.    Signed by: [name], M.A., CCC/SLP 11/12/2019 10:00:00 AM

## 2019-11-12 NOTE — Rehab PPS CMG ([facility]) (Addendum)
Corrected 11/21/2019 10:32:22 AM    NAME: Christy Mcintosh  MRN: [id number]  Account: [id number]  Session Start: 11/08/2019 12:00:00 AM  Session Stop: 11/08/2019 12:00:00 AM    Total Treatment Minutes:  Minutes    PPS CMG Coordinator  Inpatient Rehabilitation Admission    IRF Admission Date:  11/06/2019      Admission Class: Initial Rehab.  [facility]  Pre-Hospital Living: Home. Pre-Hospital Living  With: (2) Family/Relatives.  Payer Source: Primary: Medicare Fee for Service  Secondary: Not Listed.  Additional Information: CHAMPUS.  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 No    Cognitive Patterns: Brief Interview for Mental Status (BIMS) was conducted.  Repetition of Three Words: Three words  Able to report correct year: Correct  Able to report correct month: Accurate within 5 days  Able to report correct day of the week: Correct  Able to recall "sock": Yes, no cue required  Able to recall "blue": Yes, no cue required  Able to recall "bed": Yes, no cue required    BIMS SUMMARY SCORE: 15 Cognitively intact Patient was able to complete the Brief  Interview for Mental Status    Self Care Admission Performance                       Performance  GG0130. Self Care  Eating               5 - Setup or clean-up assistance  Oral hygiene         4 - Supervision or touching assistance  Toileting hygiene    4 - Supervision or touching assistance  Shower/bathe  self    4 - Supervision or touching assistance  Upper body dressing  4 - Supervision or touching assistance  Lower body dressing  4 - Supervision or touching assistance  Footwear on/off      5 - Setup or clean-up assistance  Mobility Admission Performance                       Performance  GG0170. Mobility  Roll left/right      4 - Supervision or touching assistance  Sit to lying         4 - Supervision or touching assistance  Lying to sitting bed 4 - Supervision or touching assistance  Sit to stand         4 - Supervision or touching assistance  Chair/bed transfer   4 - Supervision or touching assistance  Toilet transfer      4 - Supervision or touching assistance  Car transfer         4 - Supervision or touching assistance  Walk 10 feet         4 - Supervision or touching assistance  Walk 50  ft 2 turns   4 - Supervision or touching assistance  Walk 150 feet        88 - Not attempted due to medical or safety concerns  10 ft uneven surface 4 - Supervision or touching assistance  1 step (curb)        3 - Partial/moderate assistance  4 steps              3 - Partial/moderate assistance  12 steps             88 - Not attempted due to medical or safety concerns  Picking up object    36 - Not attempted due to medical or safety concerns  Use wheelchair?      No  Care Tool Bowel and Bladder: Bladder Continence: Always continent (no documented  incontinence).  Bowel Continence: Always continent (no documented incontinence).    MEDICAL NEEDS  Height on Admission: 63 inches.  Weight on Admission: 140 pounds.  Swallowing/Nutritional Status: Regular food (solids and liquids swallowed safely  without supervision or modified food or liquid consistency).    QUALITY INDICATORS  Skin Conditions: Unhealed Pressure Ulcer/Injuries at Stage 1 or Higher on  Admission:  No.    Active Diagnosis: Comorbidities and Co-existing Conditions:   Diabetes Mellitus  (DM) - e.g., diabetic retinopathy, nephropathy, and  neuropathy).    Medication:  Potential Clinically Significant Medication Issues: No issues found during  review    Signed by: Tanda Rockers, PT, PPS Coordiantor 11/08/2019 4:00:00 PM

## 2019-11-12 NOTE — Rehab Progress Note ([facility]) (Signed)
Christy Mcintosh  MRN: [id number]  Account: [id number]  Session Start: 11/12/2019 12:00:00 AM  Session Stop: 11/12/2019 12:00:00 AM    Total Treatment Minutes:  Minutes    Rehabilitation Nursing  Inpatient Rehabilitation Shift Assessment    Rehab Diagnosis: Monophasic Demyelinating Myeloencephalitis  Demographics:            Age: 89Y            Gender: Female  Primary Language: English    Date of Onset:  09/12/19  Date of Admission: 11/06/2019 5:56:00 PM    Rehabilitation Precautions Restrictions:   Fall Precaution and Skin Precaution    Patient Report: "I really would like to have some fruit for breakfast."  Patient/Caregiver Goals:  "I want to get stronger and go back home"    Wounds/Incisions: No wounds or incisions.    Medication Review: No clinically significant medication issues identified this  shift.    Bowel and Bladder Output:                       Bladder (# only)             Bowel (# only)  Number of Episodes  Continent            4                            3  Incontinent          0                            0    Additional Education Provided:    Education Provided: Pain management. Pain scale. Medication options. Safety  issues and interventions. Fall protocol. Supervision requirements. Use of  adaptive devices. Nutrition/feeding. Dietary recommendations. Functional  transfers. Safety. Medication. Name and dosage. Administration. Purpose. Side  Effects. Labs. Anticoagulants. Diabetes. Glucose testing. Diet management.       Audience: Patient.       Mode: Explanation.       Response: Verbalized understanding.  Needs reinforcement.    TEAM CARE PLAN  Identified problems from team documentation:  Problem: Impaired Cognition    Problem: Impaired Mobility  Mobility: Primary Team Goal: Pt will perform all mobility with ModI with LRAD  for safe D/C home./Active    Problem: Impaired Pain Management  Pain Mgmt: Primary Team Goal: Patient will verbalize pain less than or equal to  2 out of 10 with the use of  medication and positioning techniques/    Problem: Impaired Psychosocial Skills/Behavior  PsychoSocial: Primary Team Goal: Maintain healthy adjustment to the  rehabilitation process and decrease stress related to functional changes./    Problem: Impaired Self-care Mgmt/ADL/IADL  Self Care: Primary Team Goal: Pt will complete ADL routine and household  transfers with mod I/Active    Problem: Impaired Skin/Wound Mgmt  Skin Wound: Primary Team Goal: Patient  will independently perform weight shifts  to prevent skin breakdown/    Problem: Safety Risk and Restraint  Safety: Primary Team Goal: Patient will follow safety precautions and call for  assistance 100% of the time during hospitalization/    Please review Integrated Patient View Care Plan Flowsheet for Team identified  Problems, Interventions, and Goals.    Signed by: Bosie Clos, RN 11/12/2019 2:43:00 PM

## 2019-11-12 NOTE — Rehab Evaluation (Medilinks) (Signed)
Christy Mcintosh  MRN: [id number]  Account: [id number]  Session Start: 11/07/2019 12:00:00 AM  Session Stop: 11/07/2019 12:00:00 AM    Total Treatment Minutes:  Minutes    Therapeutic Recreation  Inpatient Rehabilitation Initial Evaluation    Risks/Benefits of Rehabilitation Discussed with Patient/Caregiver: Yes.    Rehab Diagnosis: Monophasic Demyelinating Myeloencephalitis  Demographics:            Age: 55Y            Gender: Female  Primary Language: English    Past Medical History: Interstitial Cystitis  DM  HTN  Hypothyroidism  Asthma  GERD/IBS  Seizures D/O  Fibromyalgia  DDD Lumbar Spine  Overactive Bladder  Chronic Constipation  GI bleed  Esophageal Stricture    PSH:  Appendectomy  Bladder sling  Hysterectomy  BS and ovarian removal  Craniotomy secondary to meningioma  Breast Bx's  Bilateral knee meniscus surgeries  Cataract Surgeries  Rhinoplasty  Rotator cuff surgery  History of Present Illness: Christy Mcintosh is a 59 year old female originally  admitted to [facility] in January 2021 with colonic  inertia.  She was taken to the OR on 09/12/19 for Colonic Inertia, S/P Total  abdominal Colectomy with ileorectal anastamosis. Post op complicated by ilieus  and was transferred to [facility] on 10/11/19 for further treatment of post op needs  and new dx of subacute progressive myelopathy requiring Neurology consult.  The  patient was also receiving TPN, now discontinued and the patient is tolerating  diet well with improved intake. On Marinol-appetite stimulant. Regular diet with  Ensure BID.+BM.  Regarding monophasic Autoimmune Demyelinating  Myeloencephalitis-presented with subacute progressive myelopathy on 10/03/19  with BLE paraparesis and hyporeflexia.  Imaging significant for transverse  myelitis with patchy, non diffusing T2 abnormalities involving the corticospinal  tracts.  Completed 5/5 PLEX started on 09/25/19 with improvement noted.  EMG  completed on 10/14/19 without s/o of  neuropathy.  PM+R consult completed as well  as neurology consult.  The patient was also followed for the following acute  medical conditions: UTI/Cystitis- Started on Nitrofurontinin SR 100 mg BID  Started on March 10th, myeloradicular lumbar back pain- continues on Tylenol,  Cymbalta, Robaxin, and Lidoderm patches, Anemia-Continue Iron and monitor H+H  6.  MRSA Precautions-Resolved, Testing negative from 10/23/19, pt removed from  serial precautions. Hx of DM, Continue Insulin as ordered. Seizure Disorder, Hx  of Craniotomy for Meningioma resection.  Continues on PO Vimpat.   Hypothyroidism-Continue on PO Synthroid.  The patient is alert and oriented x4,  pleasant and cooperative with care needs. Tolerating regular diet well, + BM.  Paraparesis improving and the patient is working well with therapy services with  progress made.  The patient has been working well with therapy services to  include PT and OT to address functional mobility, self care deficit, and pain.  The patient would benefit from an intensive level of rehab along with close  medical management of multiple medical conditions and rehab MD to drive the  rehab process.  The patient is medically stable for IRF level of care.            Date of Onset: 09/12/19            Date of Admission: 11/06/2019 5:56:00 PM    Medications and Allergies: Significant rehabilitation considerations:   see EPIC  Rehabilitation Precautions/Restrictions:   Fall Precaution and Skin Precaution    SUBJECTIVE  Premorbid Functional Level: Independent  Understanding of Current  Condition: Pt has excellent awareness of medical  situation and can adequately detail multitude of diagnoses made during  hospitalization as well as reflect on impacts they have had on her functioning.  Patient/Caregiver Goals:  Patient's functional goals: "I want to get stronger  and go back home"  Pain: Patient currently without complaints of pain.  Social History:  Marital Status: Married  Children: 4           Reside:  [place]  Employment Status:  Currently Unemployed  Recreational Activities/Hobbies:  house work    OBJECTIVE  Physical Limitations: LE weakness; impaired independence with transfes and self  care    Cognition/Behavior: Pt with cognitive deficits impacting memory and attention.    Leisure Interests:  Pt enjoys being with her grandchildren and house work.    Pain Reassessment: Pain was not reassessed as no pain was reported.    Interdisciplinary Educational Needs and Learning Preferences:       Learning Preference: The patient's preferred learning method is:  Explanation.  The patient's preferred learning method is: Demonstration.       Barriers to Learning: Cognitive limitations.  Mobility.       Learning Needs: Leisure/vocational    Education Provided: No education provided this session.    ASSESSMENT  Summary of Deficits and Related Problems: Patient presents with the following  deficits: Decreased Community Functioning. Decreased Functional Mobility.  Decreased Leisure Independence.  These deficits are secondary to: Impaired Strength. Impaired Coordination.  Impaired Balance. Cognitive Deficits.  Rehab Potential: Good premorbid functional status, Motivated  Barriers to Progress/Discharge: Functional status    Long Term Goals: Time frame to achieve long term goal(s): 7-10 days       1. Pt will participate in a leisure activity of choice req. supervision  upon discharge.       2. Pt will demonstrate increased memory and attention during cognitive  leisure tasks req. min cues upon discharge.       3. Pt will improve strength and endurance as demonstrated through the  ability to perform and tolerate leisure activities upon discharge.  Short Term Goals: Not applicable.    Recommendations/Goals for Rehabilitation Discussed with Patient/Caregiver: Yes.        PLAN  Therapeutic Recreation services are recommended to address: cognition, mobility,  leisure maintenance, endurance, education,  socialization    Care Plan  Identified problems from team documentation:  Problem: Impaired Mobility  Mobility: Primary Team Goal: Pt will perform all mobility with ModI with LRAD  for safe D/C home./Active    Problem: Impaired Pain Management  Pain Mgmt: Primary Team Goal: Patient will verbalize pain less than or equal to  2 out of 10 with the use of medication and positioning techniques/    Problem: Impaired Psychosocial Skills/Behavior  PsychoSocial: Primary Team Goal: Maintain healthy adjustment to the  rehabilitation process and decrease stress related to functional changes./    Problem: Impaired Self-care Mgmt/ADL/IADL  Self Care: Primary Team Goal: Pt will complete ADL routine and household  transfers with mod I/    Problem: Impaired Skin/Wound Mgmt  Skin Wound: Primary Team Goal: Patient  will independently perform weight shifts  to prevent skin breakdown/    Problem: Safety Risk and Restraint  Safety: Primary Team Goal: Patient will follow safety precautions and call for  assistance 100% of the time during hospitalization/    Identified problems from this assessment:     No problems identified at this time.    Please review Integrated Patient  View Care Plan Flowsheet for Team identified  Problems, Interventions, and Goals.    Signed by: [name], CTRS 11/07/2019 10:00:00 AM

## 2019-11-12 NOTE — Rehab Progress Note (Medilinks) (Signed)
NAMEMEGAHN SHORTY  MRN: [id number]  Account: [id number]  Session Start: 11/12/2019 8:00:00 AM  Session Stop: 11/12/2019 9:00:00 AM    Total Treatment Minutes: 60.00 Minutes    Physical Therapy  Inpatient Rehabilitation Treatment Note    Rehab Diagnosis: Monophasic Demyelinating Myeloencephalitis  Demographics:            Age: 89Y            Gender: Female  Rehabilitation Precautions/Restrictions:   scissoring gait especially without AD  Fall Precaution and Skin Precaution    OBJECTIVE  Vital Signs:                       Before Activity              After Activity  Vitals  BP Systolic          -                            113  BP Diastolic         -                            82  Pulse                -                            96  O2 Saturation        -                            100%    Interventions:       Therapeutic Activities:  Focus on rollator safety and use.  Gait training  with rollator with close S/occ CGA up to 150' bouts x 4, very narrow BOS and occ  scissoring.  Curb training 4'' with rollator with mina for balance and rollator  management.  Continued education on progress and D/C planning.  Patient was left seated in chair in his/her room. Handoff to nurse completed.  Oriented to call bell and placed within reach.  Personal items within reach.  Assistive devices positioned out of reach.    ASSESSMENT  Pt demo's safety with rollator and item ordered for use upon D/C.  Will continue  to train with rollator for increased safety.  Pt continues to progress well with  motor control, balance, strength, endurance, and overall safety.  Will continue  to progress for safe D/C home.    3 Hour Rule Minutes: 60 minutes of PT treatment this session count towards  intensity and duration of therapy requirement. Patient was seen for the full  scheduled time of PT treatment this session.  Therapy Mode Minutes: Individual: 60 minutes.    Signed by: [name], DPT 11/12/2019 9:00:00 AM

## 2019-11-12 NOTE — Progress Notes (Signed)
PROGRESS NOTE -- FACE-TO-FACE ENCOUNTER  PHYSICAL MEDICINE AND REHABILITATION      Date Time: 11/12/19 1:27 PM  Patient Name: Christy Mcintosh, Christy Mcintosh    Admission date:  11/06/2019  Functional Status/Update:   I reviewed patient's therapy notes to assess functional status and ongoing need for therapies.     PT: Focus on rollator safety and use.  Gait training  with rollator with close S/occ CGA up to 150' bouts x 4, very narrow BOS and occ  scissoring.  Curb training 4'' with rollator with mina for balance and rollator  management.     OT: Pt received seated upright on toilet with RN  present and agreeable to OT tx. Urgency of bowels reported this hour, completing  BM 3x with general supervision with FWW for all aspects of toileting and hand  hygiene. Pt ambulated throughout 5th floor >383ft to access OT gym and 5B  laundry units with intermittent CGA and 0 LOB. Pt engaged in household transfer  training with FWW; therapist demonstrated variety of techniques to access  walk-in shower, and pt completed with CGA and min cues for positioning/safety  2/2 problem solving impairments. Completed toilet transfer both with and without  raised seat, and pt educated on benefits of reducing reliance on non-essential  DME in order to increase strength and maintain CLOF. Pt verbalized  understanding. Completed simulated laundry activity, leaning outside of BOS into  front-loading dryer 7x to retrieve towels individually and fold in standing.  Therapist coached pt through adaptive balance techniques with good carry over.    SLP: SLP intervention addressed executive functions in context  of daily scheduling. She is ambulating to speech offices at close supervision  level while utilizing walker as an AD. No cueing provided to maintain safety  during short distance walk.       Speech Treatment: Patient completed homework assignment from yesterday  ([employer] Calendar events) prior to today?s session >80% acc.  She continued to  demonstrate  functional carryover of newly learned calendar use by generating two  events provided incidental verbal cues. Teach/back approach incorporated in  order to reinforce calendar sequencing. SLP facilitated increased organization  while teaching highlighting strategy of written information. Pt highlighted  pertinent information on medicine labels, ie med frequency, provided max  decreased to min verbal cues by rep #4. She answered 4/4 simple problem solving  questions related to medication labels without cueing.    Subjective/ROS:   Patient was seen and examined today. No acute issues overnight. No new complaints with pain or sleep. Has not been eating due to distaste for low fiber diet. She has been craving fruit and was unable to order it.  Diarrhea has stopped.  Discussed upgrading her diet, but advised she order a light meal to start to avoid GI upset.  Also discussed plan to [place] vitamin supplements due to normal levels upon reassessment. Dr. Francesco Sor delivered patient's imaging CD to her.  Medications:   Medications reviewed by me:     Scheduled Meds: PRN Meds:    acetaminophen, 1,000 mg, Oral, TID  ALPRAZolam, 2 mg, Oral, QHS  amphetamine-dextroamphetamine, 20 mg, Oral, BID  dronabinol, 2.5 mg, Oral, Daily  DULoxetine, 60 mg, Oral, Daily  enoxaparin, 40 mg, Subcutaneous, Daily  ezetimibe, 10 mg, Oral, QHS  ferrous sulfate, 324 mg, Oral, QAM W/BREAKFAST  gabapentin, 300 mg, Oral, Q8H SCH  lacosamide, 150 mg, Oral, BID  levothyroxine, 50 mcg, Oral, Daily at 0600  lidocaine, 1 patch, Transdermal, Q24H  losartan, 25 mg,  Oral, Daily  pantoprazole, 40 mg, Oral, Daily at 0600  rosuvastatin, 40 mg, Oral, QHS  thiamine, 100 mg, Oral, Daily  vitamin D (cholecalciferol), 25 mcg, Oral, Daily  zonisamide, 200 mg, Oral, BID        Continuous Infusions:   acetaminophen, 650 mg, Q6H PRN  dextrose, 15 g of glucose, PRN   And  dextrose, 12.5 g, PRN   And  glucagon (rDNA), 1 mg, PRN  methocarbamol, 750 mg, TID PRN  oxyCODONE, 5 mg, Q6H  PRN  polyethylene glycol, 17 g, Daily PRN  senna-docusate, 2 tablet, BID PRN  simethicone, 80 mg, Q6H PRN              Physical Exam:     Vitals:    11/11/19 1647 11/12/19 0628 11/12/19 0811 11/12/19 0824   BP: 119/84 125/86 118/78 113/82   Pulse: 85 87 86 91   Resp: 17 18 16     Temp: 98.1 F (36.7 C) 98.1 F (36.7 C) 98.3 F (36.8 C)    TempSrc: Oral Oral Oral    SpO2: 100% 100% 98% 100%   Weight:       Height:         General: NAD, well nourished  HEENT: NC/AT, mouth mucosa pink and moist  Heart: RRR  Lungs: CTAB  Abdomen: Soft, non-tender, +BS  Extremities: No edema, palpable pulses  Neuro: AAOx3, speech fluent    Intake and Output Summary (Last 24 hours) at Date Time    Intake/Output Summary (Last 24 hours) at 11/12/2019 1327  Last data filed at 11/12/2019 0830  Gross per 24 hour   Intake 940 ml   Output 1 ml   Net 939 ml     P.O.: 340 mL (11/12/19 0830)     Urine: 1 mL(unmeasured output) (11/12/19 0830)               Labs:   No results for input(s): GLUCOSEWHOLE in the last 24 hours.    Recent Labs   Lab 11/11/19  0523 11/07/19  0700   WBC 3.61 3.60   Hgb 9.3* 9.0*   Hematocrit 28.7* 28.2*   Platelets 303 391*        Recent Labs   Lab 11/11/19  0523 11/07/19  0700   Sodium 143 142   Potassium 4.1 4.1   Chloride 108 108   CO2 24 25   BUN 17 10   Creatinine 0.7 0.8   Calcium 10.0 9.7   Albumin  --  4.6   Protein, Total  --  6.8   Bilirubin, Total  --  0.2   Alkaline Phosphatase  --  74   ALT  --  18   AST (SGOT)  --  17   Glucose 74 100             Results     Procedure Component Value Units Date/Time    Vitamin E [[id number]]  (Abnormal) Collected: 11/08/19 0623    Specimen: Blood Updated: 11/11/19 1938     Vitamin E (Alpha Tocopherol) 17.1 mg/L              Rads:   Radiological Procedure reviewed.  Radiology Results (24 Hour)     Procedure Component Value Units Date/Time    MRI Neuro Imported Outside Images [[id number]] Resulted: 11/12/19 0906    Order Status: Completed Updated: 11/12/19 0906    Narrative:       The attached image(s) (individually and collectively, "Image") is/are  from   prior  encounter(s) occurring outside the current encounter at [facility].  The   Image was obtained from the patient or patient's treating physician (in   some cases, at the request of the patient's referring physician), and is   incorporated into the [facility] medical record system for reference for the   patient's current encounter and future encounters at [place].  No current   interpretation is provided for the Image.      MRI Neuro Imported Outside Images [[id number]] Resulted: 11/12/19 0904    Order Status: Completed Updated: 11/12/19 0904    Narrative:      The attached image(s) (individually and collectively, "Image") is/are from   prior  encounter(s) occurring outside the current encounter at [facility].  The   Image was obtained from the patient or patient's treating physician (in   some cases, at the request of the patient's referring physician), and is   incorporated into the [facility] medical record system for reference for the   patient's current encounter and future encounters at [place].  No current   interpretation is provided for the Image.      MRI Neuro Imported Outside Images [[id number]] Resulted: 11/12/19 0903    Order Status: Completed Updated: 11/12/19 0903    Narrative:      The attached image(s) (individually and collectively, "Image") is/are from   prior  encounter(s) occurring outside the current encounter at [facility].  The   Image was obtained from the patient or patient's treating physician (in   some cases, at the request of the patient's referring physician), and is   incorporated into the [facility] medical record system for reference for the   patient's current encounter and future encounters at [place].  No current   interpretation is provided for the Image.      MRI Spine Imported Outside Images [[id number]] Resulted: 11/12/19 0836    Order Status: Completed Updated: 11/12/19 0836    Narrative:      The attached image(s) (individually  and collectively, "Image") is/are from   prior  encounter(s) occurring outside the current encounter at [facility].  The   Image was obtained from the patient or patient's treating physician (in   some cases, at the request of the patient's referring physician), and is   incorporated into the [facility] medical record system for reference for the   patient's current encounter and future encounters at [place].  No current   interpretation is provided for the Image.      MRI Spine Imported Outside Images [[id number]] Resulted: 11/12/19 0835    Order Status: Completed Updated: 11/12/19 0835    Narrative:      The attached image(s) (individually and collectively, "Image") is/are from   prior  encounter(s) occurring outside the current encounter at [facility].  The   Image was obtained from the patient or patient's treating physician (in   some cases, at the request of the patient's referring physician), and is   incorporated into the [facility] medical record system for reference for the   patient's current encounter and future encounters at [place].  No current   interpretation is provided for the Image.      MRI Spine Imported Outside Images [[id number]] Resulted: 11/12/19 0832    Order Status: Completed Updated: 11/12/19 2440    Narrative:      The attached image(s) (individually and collectively, "Image") is/are from   prior  encounter(s) occurring outside the current encounter at [facility].  The   Image was  obtained from the patient or patient's treating physician (in   some cases, at the request of the patient's referring physician), and is   incorporated into the [facility] medical record system for reference for the   patient's current encounter and future encounters at [place].  No current   interpretation is provided for the Image.      MRI Spine Imported Outside Images [[id number]] Resulted: 11/12/19 0826    Order Status: Completed Updated: 11/12/19 4782    Narrative:      The attached image(s) (individually and collectively, "Image") is/are from    prior  encounter(s) occurring outside the current encounter at [facility].  The   Image was obtained from the patient or patient's treating physician (in   some cases, at the request of the patient's referring physician), and is   incorporated into the [facility] medical record system for reference for the   patient's current encounter and future encounters at [place].  No current   interpretation is provided for the Image.      MRI Neuro Imported Outside Images [[id number]] Resulted: 11/12/19 0805    Order Status: Completed Updated: 11/12/19 0805    Narrative:      The attached image(s) (individually and collectively, "Image") is/are from   prior  encounter(s) occurring outside the current encounter at [facility].  The   Image was obtained from the patient or patient's treating physician (in   some cases, at the request of the patient's referring physician), and is   incorporated into the [facility] medical record system for reference for the   patient's current encounter and future encounters at [place].  No current   interpretation is provided for the Image.            Assessment/Plan:     Continue PT, OT in acute rehabilitation medical setting.  Continue comprehensive and intensive inpatient rehab program, including: Physical therapy 60-120 min daily, 5-6 times per week, Occupational therapy 60-120 min daily, 5-6 times per week, Case management and Rehabilitation nursing for the following:    Peripheral autoimmune demyelinating disease.   Patient Active Problem List   Diagnosis   . Peripheral autoimmune demyelinating disease     #Monophasic demyelinating myeloencephalitis with deficits in mobility and ADLs: OOB, fall precautions  -Imaging significant for transverse myelitis s/p PLEX x 5 started on 2/3 with improvement noted per documentation  -B-1 level pending  [place] B12, folic acid, Vitamin E supplementation as levels are normal/high  Neurology consulted to follow    #Neuro (Hx seizure dz, s/p craniotomy for meningioma resection):  Continue Vimpat, Zonegran  Gabapentin    #GI (s/p abdominal colectomy with ileorectal anastamosis 09/12/19 secondary to colonic inertia, GERD, esophageal stricture, IBS): Diarrhea now resolved  3/23 [place]'d Marinol  PO low fiber diet-->upgraded to consistent carb 3/23 per patient request  Miralax PRN for constipation    #Hypercholesterolemia: Zetia    #Pain Management (Hx lumbar radiculopathy, fibromyalgia): Tylenol, Cymbalta, and Lidoderm patches    #Anemia: Monitor CBC  Continue iron and Vitamin C supplement    #DMII: Hypoglycemic on admission, SSI coverage only for now  Medicine consulted to follow    #Hypothyroidism: Synthroid    #DVT Prophylaxis: Use Lovenox    #Bowel/Bladder Management: PVRs to rule out urinary retention. Use Senna and Colace    #Skin Care: Keep skin clean and dry    #Psych: Psychology services for evaluation and treatment as needed for adjustment/coping     #FEN/GI: Upgraded from low fiber to consistent carb  diet. Patient had poor appetite on low fiber diet. Nutrition consult  Protonix for GI prophylaxis    #Dispo: TBD pending rehab progress  Team conference was held 11/12/19. Patient' status and progress  discussed at length today with the interdisciplinary team during team conference ;report with detailed status, progress, and goals will be placed in Medilinks     Face to face discussion with the patient and coordination of care  took more than  35 minutes; more than 50% of time was spent on coordination of care with interdisciplinary team including physical therapist, occupational therapist, SLP, recreational therapist ,rehabilitation nursing, dietician , neuropsychologist ,and case manager     Medication Review  A complete drug regimen review was completed: Yes  A complete drug regimen review was completed and no drug issues were found.      Discussed plan with patient. All questions were answered.  Signed by: Clover Mealy DO    [facility]

## 2019-11-12 NOTE — Rehab Progress Note (Medilinks) (Signed)
Christy Mcintosh  MRN: [id number]  Account: [id number]  Session Start: 11/12/2019 12:00:00 AM  Session Stop: 11/12/2019 12:00:00 AM    Total Treatment Minutes:  Minutes    Therapeutic Recreation  Inpatient Rehabilitation Exception Note    Patient was unable to complete planned session for the following reasons:   pt declined horticulture group due to abdmonial discomfort  Will attempt to see patient as tolerated    Signed by: [name], CTRS 11/12/2019 11:00:00 AM

## 2019-11-12 NOTE — Rehab IRF Data Coll Rights Priv Act (Medilinks) (Signed)
NAMEERYNNE STEENSON  MRN: [id number]  Account: [id number]  Session Start: 11/06/2019 12:00:00 AM  Session Stop: 11/06/2019 12:00:00 AM    Total Treatment Minutes:  Minutes      IRFPPS Data Collection Rights and Rehab Privacy Act Notice: IRF-PAI Data  Collection Rights and Rehab Privacy Act Statement provided/given to patient and  family.    Signed by: Tanda Rockers, PT, PPS Coordiantor 11/06/2019 6:20:00 PM

## 2019-11-12 NOTE — Rehab Progress Note (Medilinks) (Signed)
Christy Mcintosh  MRN: [id number]  Account: [id number]  Session Start: 11/12/2019 10:00:00 AM  Session Stop: 11/12/2019 11:00:00 AM    Total Treatment Minutes: 60.00 Minutes    Occupational Therapy  Inpatient Rehabilitation Treatment Note    Rehab Diagnosis: Monophasic Demyelinating Myeloencephalitis  Demographics:            Age: 52Y            Gender: Female  Rehabilitation Precautions/Restrictions:   Fall Precaution and Skin Precaution    Interventions:       Self Care/Home Management:  Pt received seated upright on toilet with RN  present and agreeable to OT tx. Urgency of bowels reported this hour, completing  BM 3x with general supervision with FWW for all aspects of toileting and hand  hygiene. Pt ambulated throughout 5th floor >345ft to access OT gym and 5B  laundry units with intermittent CGA and 0 LOB. Pt engaged in household transfer  training with FWW; therapist demonstrated variety of techniques to access  walk-in shower, and pt completed with CGA and min cues for positioning/safety  2/2 problem solving impairments. Completed toilet transfer both with and without  raised seat, and pt educated on benefits of reducing reliance on non-essential  DME in order to increase strength and maintain CLOF. Pt verbalized  understanding. Completed simulated laundry activity, leaning outside of BOS into  front-loading dryer 7x to retrieve towels individually and fold in standing.  Therapist coached pt through adaptive balance techniques with good carry over.  Patient was returned to or left in bed. Handoff to nurse completed.  Oriented to call bell and placed within reach.  Personal items within reach.  Assistive devices positioned out of reach.    ASSESSMENT  Pt progressing well with mobility and demonstrates good balance overall with use  of AD. However, observed to still reduce symmetrical weight bearing in  preference for perceived "stronger" leg, therefore will benefit from ongoing  strengthening and dynamic  balance tasks for safe discharge home.    3 Hour Rule Minutes: 60 minutes of OT treatment this session count towards  intensity and duration of therapy requirement. Patient was seen for the full  scheduled time of OT treatment this session.  Therapy Mode Minutes: Individual: 60 minutes.    Signed by: [name], OTR/L 11/12/2019 11:00:00 AM

## 2019-11-12 NOTE — Rehab Progress Note (Medilinks) (Signed)
Christy Mcintosh  MRN: [id number]  Account: [id number]  Session Start: 11/12/2019 2:00:00 PM  Session Stop: 11/12/2019 3:00:00 PM    Total Treatment Minutes: 60.00 Minutes    Speech Language Pathology  Inpatient Rehabilitation Treatment Note    Rehab Diagnosis: Monophasic Demyelinating Myeloencephalitis  Demographics:            Age: 52Y            Gender: Female  Rehabilitation Precautions/Restrictions:   Fall Precaution and Skin Precaution    Interventions:       Speech Treatment: SLP interventions targeted use of compensatory memory  strategies and cognitive fatigue management.  -RN administered medications at start of session. Patient reported concerns with  recalling her medications, particularly given frequent changes. She reported  dislike of written lists d/t frequent changes and needs to update them. She was  agreeable to recommendation of medication management app, such as medisafe, to  track medications and to provide alarms to support prospective memory. She  reported desire to complete set-up at another time. -Patient reporting cognitive  fatigue since her hospitalization. She was educated on mechanism of cognitive  fatigue. She was introduced to a cognitive pacing scale for management of  cognitive fatigue. Provided min cues she identified estimated levels of fatigue  for various home activities. Unable to practice planning a mock schedule with  rest breaks d/t time constraints.  -Patient completed toileting routine with adequate safety awareness and  sequencing.  Patient was returned to or left in bed. Handoff to nurse completed.  Oriented to call bell and placed within reach.  Personal items within reach.  Assistive devices positioned out of reach.  Bed placed in lowest position.    ASSESSMENT  Patient would benefit from ongoing training with cognitive pacing scale for  management of reported cognitive fatigue.    3 Hour Rule Minutes: 60 minutes of SLP treatment this session count towards  intensity  and duration of therapy requirement. Patient was seen for the full  scheduled time of SLP treatment this session.  Therapy Mode Minutes: Individual: 60 minutes.    Signed by: Marcha Solders, M.S., CCC-SLP 11/12/2019 3:00:00 PM

## 2019-11-12 NOTE — Rehab Progress Note (Medilinks) (Signed)
Christy Mcintosh  MRN: [id number]  Account: [id number]  Session Start: 11/12/2019 12:00:00 AM  Session Stop: 11/12/2019 12:00:00 AM    Total Treatment Minutes:  Minutes    Rehabilitation Nursing  Inpatient Rehabilitation Shift Assessment    Rehab Diagnosis: Monophasic Demyelinating Myeloencephalitis  Demographics:            Age: 53Y            Gender: Female  Primary Language: English    Date of Onset:  09/12/19  Date of Admission: 11/06/2019 5:56:00 PM    Rehabilitation Precautions Restrictions:   Fall Precaution and Skin Precaution    Patient Report: "Oh I  was telling you that I was going to need to go to the  bathroom but you were all ready gone  Patient/Caregiver Goals:  "I want to get stronger and go back home"    Wounds/Incisions: No wounds or incisions.    Medication Review: No clinically significant medication issues identified this  shift.    Bowel and Bladder Output:                       Bladder (# only)             Bowel (# only)  Number of Episodes  Continent            2                            1  Incontinent          0                            0    Additional Education Provided:    Education Provided: Precautions. Pain management. Communication strategies.  Activities of daily living. Medication. Name and dosage. Administration.  Purpose. Side Effects. Precautions. Pain management. Precautions. Pain  management. Activities of daily living. Nutrition/feeding.       Audience: Patient.       Mode: Explanation.  Demonstration.       Response: Applied knowledge.    TEAM CARE PLAN  Identified problems from team documentation:  Problem: Impaired Mobility  Mobility: Primary Team Goal: Pt will perform all mobility with ModI with LRAD  for safe D/C home./Active    Problem: Impaired Pain Management  Pain Mgmt: Primary Team Goal: Patient will verbalize pain less than or equal to  2 out of 10 with the use of medication and positioning techniques/    Problem: Impaired Psychosocial  Skills/Behavior  PsychoSocial: Primary Team Goal: Maintain healthy adjustment to the  rehabilitation process and decrease stress related to functional changes./    Problem: Impaired Self-care Mgmt/ADL/IADL  Self Care: Primary Team Goal: Pt will complete ADL routine and household  transfers with mod I/    Problem: Impaired Skin/Wound Mgmt  Skin Wound: Primary Team Goal: Patient  will independently perform weight shifts  to prevent skin breakdown/    Problem: Safety Risk and Restraint  Safety: Primary Team Goal: Patient will follow safety precautions and call for  assistance 100% of the time during hospitalization/    Please review Integrated Patient View Care Plan Flowsheet for Team identified  Problems, Interventions, and Goals.    Signed by: Jerene Dilling, RN 11/12/2019 1:09:00 AM

## 2019-11-12 NOTE — Progress Notes (Signed)
MEDICINE PROGRESS NOTE  [facility], DIVISION OF HOSPITALIST MEDICINE   [facility]   [place] Pager: [phone]      Date Time: 11/12/19 12:44 PM  Patient Name: Christy Mcintosh  Attending Physician[facility]  Assessment:     Active Hospital Problems    Diagnosis    Peripheral autoimmune demyelinating disease       58 year oldright hand dominantfemalewith PMHx lumbar radiculopathy, anemia, DMII, seizure disorder, hx craniotomy for meningioma resection on Vimpat, hypothyroidism, who was in her normal state of health until [facility]. She was taken to the OR on 09/12/19, and is now s/p total abdominal colectomy with ileorectal anastamosis. 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  She was transferred to [facility] on 10/11/2019; treatment appears to have included 5 plasma exchange treatment with significant improvement.  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  -Monitor tolerance to current diet; change Peri-Colace from scheduled dosing to as needed as patient reports she is having excessive loose stools  -Added as needed simethicone as patient reports some gaseous discomfort which had previously improved with simethicone    History of DM type 2--HbA1C 5 0.0 on 3/18  -BG have been well  controlled with diet alone  Continue to monitor BG and add insulin sliding scale AC if elevated    HTN   -Continue losartan, follow blood pressure    Hyperlipidemia  -Continue Crestor and Zetia    Hypothyroidism  -Continue Synthroid    Normocytic anemia--suspect component of iron deficiency  -Start iron supplement with TSAT 11% on 11/08/2019,   Ferritin noted to be within normal limits at 32  -Noted normal B12 and folate levels on 3/19    Hx of narcolepsy?  -cont Adderall     Anxiety  -cont Cymbalta, also on evening xanax    Deep vein thrombosis prophylaxis with Lovenox     Case discussed with:Pt, staff including nursing.     Safety Checklist:     DVT prophylaxis:  CHEST guideline (See page [id number]) Chemical   Foley:  [place] [facility] Foley protocol Not present   IVs:  Peripheral IV   PT/OT: Ordered     Lines:     Patient Lines/Drains/Airways Status    Active PICC Line / CVC Line / PIV Line / Drain / Airway / Intraosseous Line / Epidural Line / ART Line / Line / Wound / Pressure Ulcer / NG/OG Tube     None                 Disposition:     Today's date: 11/12/2019   Length of Stay: 6      Subjective     CC: Peripheral autoimmune demyelinating disease  Interval History/24 hour events: no events  Supervise For Meals Frequency: All meals  ENSURE COMPACT SUPPLEMENT Quantity: A. One; Frequency: BID (  2 times a day) - with Breakfast and Dinner  Diet consistent carbohydrate Additional restrictions: LOW LACTOSE  HPI/Subjective: Patient seen and examined.  No events overnight.  Denies any chest pain or shortness of breath.  No nausea or vomiting.  No fevers.  She states her lower extremity weakness is improving.    Review of Systems:   Review of Systems - Negative except as above in HPI    Physical Exam:     Temp:  [98.1 F (36.7 C)-98.3 F (36.8 C)] 98.3 F (36.8 C)  Heart Rate:  [85-94] 91  Resp Rate:  [16-18] 16  BP: (113-125)/(78-86) 113/82  Intake and Output Summary-last 24 Hrs:  I/O last 3 completed shifts:  In: 1020  [P.O.:1020]  Out: 0     No data recorded by O2 Device: None (Room air)     General: awake, alert ,in no acute distress  Cardiovascular: regular rate and rhythm, no murmurs  Lungs: clear to auscultation bilaterally, no additional sounds  Abdomen: soft, non-tender, non-distended; normoactive bowel sounds  Extremities: no edema  Neurological: Alert and oriented X 3, moves all extremities.   Other: 4/5 strength LLE in dorsi and plantarflexion        Meds:   Medications were reviewed:  Scheduled Meds:  Current Facility-Administered Medications   Medication Dose Route Frequency    acetaminophen  1,000 mg Oral TID    ALPRAZolam  2 mg Oral QHS    amphetamine-dextroamphetamine  20 mg Oral BID    dronabinol  2.5 mg Oral Daily    DULoxetine  60 mg Oral Daily    enoxaparin  40 mg Subcutaneous Daily    ezetimibe  10 mg Oral QHS    ferrous sulfate  324 mg Oral QAM W/BREAKFAST    gabapentin  300 mg Oral Q8H SCH    lacosamide  150 mg Oral BID    levothyroxine  50 mcg Oral Daily at 0600    lidocaine  1 patch Transdermal Q24H    losartan  25 mg Oral Daily    pantoprazole  40 mg Oral Daily at 0600    rosuvastatin  40 mg Oral QHS    thiamine  100 mg Oral Daily    vitamin D (cholecalciferol)  25 mcg Oral Daily    zonisamide  200 mg Oral BID     Continuous Infusions:  PRN Meds:.acetaminophen, Nursing communication: Adult Hypoglycemia Treatment Algorithm **AND** dextrose **AND** dextrose **AND** glucagon (rDNA), methocarbamol, oxyCODONE, polyethylene glycol, senna-docusate, simethicone  Labs/Radiology:   Imaging personally reviewed, including: all available   No results found.  No results for input(s): GLUCOSEWB in the last 24 hours.  Recent Labs   Lab 11/11/19  0523 11/07/19  0700   Sodium 143 142   Potassium 4.1 4.1   Chloride 108 108   BUN 17 10   Creatinine 0.7 0.8   EGFR >60.0 >60.0   Glucose 74 100   Calcium 10.0 9.7     Recent Labs   Lab 11/11/19  0523 11/07/19  0700   WBC 3.61 3.60   Hgb 9.3* 9.0*   Hematocrit  28.7* 28.2*   Platelets 303 391*         Recent Labs   Lab 11/07/19  0700   Alkaline Phosphatase 74   Bilirubin, Total 0.2   ALT 18   AST (SGOT) 17             Signed by: Rod Holler Connors-McBride, DO

## 2019-11-12 NOTE — Progress Notes (Addendum)
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, on an autoimmune basis, which occurred in the postoperative setting exact etiology unclear treated with plasma exchange at [facility]  Prior history of seizures  Abnormal brain MRI, with areas of white matter changes, and also areas of enhancement noted on the study done on 24 February---> etiology is not clear, could be inflammatory/autoimmune process(unfortunately LP details of work-up done at [facility] were never sent all we got was a discharge summary)        Continue antiepileptic regimen  Agree with stopping vitamin D vitamin D supplementation and B12 supplementation when levels have normalized B1 levels are still pending   When significant enhancement noted on brain MRI, I think we will need to repeat imaging study, at some point to make sure that it is improving(given patient is improving, does not necessarily have to be done during this hospital stay)      Subjective:   Patient Seen and Examined.  Feels well, but states that she is still not able to walk properly in terms include tingling numbness in both feet, and tips of her hands    Review of Systems:   No headache, eye, ear nose, throat problems; no coughing or wheezing or shortness of breath, No chest pain or orthopnea, no abdominal pain, nausea or vomiting, No pain in the body or extremities, no psychiatric, neurological, endocrine, hematological or cardiac complaints except as noted above.     Physical Exam:   BP 113/82    Pulse 91    Temp 98.3 F (36.8 C) (Oral)    Resp 16    Ht 1.6 m (5\' 3" )    Wt 63.6 kg (140 lb 3.4 oz)    SpO2 100%    BMI 24.84 kg/m   *Awake alert oriented x3 fluent smile symmetric eye movements intact pupils equal reactive no drift moving both  arms and legs well no tremor finger-nose performed quite well excellent strength noted  Meds:      Scheduled Meds: PRN Meds:    acetaminophen, 1,000 mg, Oral, TID  ALPRAZolam, 2 mg, Oral, QHS  amphetamine-dextroamphetamine, 20 mg, Oral, BID  DULoxetine, 60 mg, Oral, Daily  enoxaparin, 40 mg, Subcutaneous, Daily  ezetimibe, 10 mg, Oral, QHS  ferrous sulfate, 324 mg, Oral, QAM W/BREAKFAST  gabapentin, 300 mg, Oral, Q8H SCH  lacosamide, 150 mg, Oral, BID  levothyroxine, 50 mcg, Oral, Daily at 0600  lidocaine, 1 patch, Transdermal, Q24H  losartan, 25 mg, Oral, Daily  pantoprazole, 40 mg, Oral, Daily at 0600  rosuvastatin, 40 mg, Oral, QHS  thiamine, 100 mg, Oral, Daily  vitamin D (cholecalciferol), 25 mcg, Oral, Daily  zonisamide, 200 mg, Oral, BID        Continuous Infusions:   acetaminophen, 650 mg, Q6H PRN  dextrose, 15 g of glucose, PRN   And  dextrose, 12.5 g, PRN   And  glucagon (rDNA), 1 mg, PRN  methocarbamol, 750 mg, TID PRN  oxyCODONE, 5 mg, Q6H PRN  polyethylene glycol, 17 g, Daily PRN  senna-docusate, 2 tablet, BID PRN  simethicone, 80 mg, Q6H PRN                Labs:  Recent Labs   Lab 11/11/19  0523 11/07/19  0700   Glucose 74 100   BUN 17 10   Creatinine 0.7 0.8   Calcium 10.0 9.7   Sodium 143 142   Potassium 4.1 4.1   Chloride 108 108   CO2 24 25   Albumin  --  4.6   AST (SGOT)  --  17   ALT  --  18   Bilirubin, Total  --  0.2   Alkaline Phosphatase  --  74     Recent Labs   Lab 11/11/19  0523 11/07/19  0700   WBC 3.61 3.60   Hgb 9.3* 9.0*   Hematocrit 28.7* 28.2*   MCV 96.6* 97.2*   MCH 31.3 31.0   MCHC 32.4 31.9   Platelets 303 391*         No results for input(s): PTT, PT, INR in the last 72 hours.       Radiology Results (24 Hour)     Procedure Component Value Units Date/Time    MRI Neuro Imported Outside Images [[id number]] Resulted: 11/12/19 0906    Order Status: Completed Updated: 11/12/19 0906    Narrative:      The attached image(s) (individually and collectively, "Image") is/are from   prior   encounter(s) occurring outside the current encounter at [facility].  The   Image was obtained from the patient or patient's treating physician (in   some cases, at the request of the patient's referring physician), and is   incorporated into the [facility] medical record system for reference for the   patient's current encounter and future encounters at [place].  No current   interpretation is provided for the Image.      MRI Neuro Imported Outside Images [[id number]] Resulted: 11/12/19 0904    Order Status: Completed Updated: 11/12/19 0904    Narrative:      The attached image(s) (individually and collectively, "Image") is/are from   prior  encounter(s) occurring outside the current encounter at [facility].  The   Image was obtained from the patient or patient's treating physician (in   some cases, at the request of the patient's referring physician), and is   incorporated into the [facility] medical record system for reference for the   patient's current encounter and future encounters at [place].  No current   interpretation is provided for the Image.      MRI Neuro Imported Outside Images [[id number]] Resulted: 11/12/19 0903    Order Status: Completed Updated: 11/12/19 0903    Narrative:      The attached image(s) (individually and collectively, "Image") is/are from   prior  encounter(s) occurring outside the current encounter at [facility].  The   Image was obtained from the patient or patient's treating physician (in   some cases, at the request of the patient's referring physician), and is   incorporated into the [facility] medical record system for reference for the   patient's current encounter and future encounters at [place].  No current   interpretation is provided for the Image.      MRI Spine Imported Outside Images [[id number]] Resulted: 11/12/19 0836    Order Status: Completed Updated: 11/12/19 0836    Narrative:      The attached image(s) (individually and collectively, "Image") is/are from   prior  encounter(s) occurring outside the  current encounter at [facility].  The   Image was obtained from the patient or patient's treating physician (in   some cases, at the  request of the patient's referring physician), and is   incorporated into the [facility] medical record system for reference for the   patient's current encounter and future encounters at [place].  No current   interpretation is provided for the Image.      MRI Spine Imported Outside Images [[id number]] Resulted: 11/12/19 0835    Order Status: Completed Updated: 11/12/19 0835    Narrative:      The attached image(s) (individually and collectively, "Image") is/are from   prior  encounter(s) occurring outside the current encounter at [facility].  The   Image was obtained from the patient or patient's treating physician (in   some cases, at the request of the patient's referring physician), and is   incorporated into the [facility] medical record system for reference for the   patient's current encounter and future encounters at [place].  No current   interpretation is provided for the Image.      MRI Spine Imported Outside Images [[id number]] Resulted: 11/12/19 0832    Order Status: Completed Updated: 11/12/19 7829    Narrative:      The attached image(s) (individually and collectively, "Image") is/are from   prior  encounter(s) occurring outside the current encounter at [facility].  The   Image was obtained from the patient or patient's treating physician (in   some cases, at the request of the patient's referring physician), and is   incorporated into the [facility] medical record system for reference for the   patient's current encounter and future encounters at [place].  No current   interpretation is provided for the Image.      MRI Spine Imported Outside Images [[id number]] Resulted: 11/12/19 0826    Order Status: Completed Updated: 11/12/19 7846    Narrative:      The attached image(s) (individually and collectively, "Image") is/are from   prior  encounter(s) occurring outside the current encounter at [facility].  The    Image was obtained from the patient or patient's treating physician (in   some cases, at the request of the patient's referring physician), and is   incorporated into the [facility] medical record system for reference for the   patient's current encounter and future encounters at [place].  No current   interpretation is provided for the Image.      MRI Neuro Imported Outside Images [[id number]] Resulted: 11/12/19 0805    Order Status: Completed Updated: 11/12/19 0805    Narrative:      The attached image(s) (individually and collectively, "Image") is/are from   prior  encounter(s) occurring outside the current encounter at [facility].  The   Image was obtained from the patient or patient's treating physician (in   some cases, at the request of the patient's referring physician), and is   incorporated into the [facility] medical record system for reference for the   patient's current encounter and future encounters at [place].  No current   interpretation is provided for the Image.             All brain imaging (MRI, CT) personally reviewed.    Case discussed with:pt       This note was generated by the Epic EMR system/ Dragon speech recognition and may contain inherent errors or omissions not intended by the user. Grammatical errors, random word insertions, deletions and pronoun errors  are occasional consequences of this technology due to software limitations.     Not all errors are caught or corrected. If there are questions or concerns about  the content of this note or information contained within the body of this dictation they should be addressed directly with the author for clarification.      Signed by: Cathe Mons, MD  Spectralink: [id number]      Answering Service: [phone]

## 2019-11-13 MED ORDER — CARBOXYMETHYLCELLULOSE SODIUM 0.5 % OP SOLN
1.00 [drp] | Freq: Two times a day (BID) | OPHTHALMIC | Status: DC
Start: 2019-11-13 — End: 2019-11-16
  Administered 2019-11-13 – 2019-11-16 (×7): 1 [drp] via OPHTHALMIC
  Filled 2019-11-13 (×7): qty 1

## 2019-11-13 NOTE — Rehab Progress Note ([facility]) (Signed)
Christy Mcintosh  MRN: [id number]  Account: [id number]  Session Start: 11/13/2019 11:00:00 AM  Session Stop: 11/13/2019 12:00:00 PM    Total Treatment Minutes: 60.00 Minutes    Physical Therapy  Inpatient Rehabilitation Treatment Note    Rehab Diagnosis: Monophasic Demyelinating Myeloencephalitis  Demographics:            Age: 68Y            Gender: Female  Rehabilitation Precautions/Restrictions:   Fall Precaution and Skin Precaution    OBJECTIVE  Vital Signs:                       Before Activity              After Activity  Vitals  BP Systolic          -                            120  BP Diastolic         -                            88  Pulse                -                            80    Interventions:       Therapeutic Activities:  Gait training with Zero G, BWS 15lbs for a total  of 974' trialling rollator, SPC, and without AD.  Focusing on increasing BOS,  facilitation of L glut contraction, balance, while incorporating mirror for  feedback as well as stair training and forward/backwards gait.  Performed hip  hikes and steps up for glut training with BUE support on HRs.  No major loss of  balance noted.  Patient was returned to or left in bed. Handoff to nurse completed.  Oriented to call bell and placed within reach.  Personal items within reach.  Assistive devices positioned out of reach.  Bed placed in lowest position.    ASSESSMENT  Pt showed improvement with glut contraction with manual cueing and exercises.  Pt gaits more cautiously and with decreased velocity without BWS and as we  decreased UE support on ADs - from rollator to [facility] to no AD.  Will continue POC.   Recommend RW or rollator for increased safety at this time.  Pt limited by  decreased processing.    3 Hour Rule Minutes: 60 minutes of PT treatment this session count towards  intensity and duration of therapy requirement. Patient was seen for the full  scheduled time of PT treatment this session.  Therapy Mode Minutes: Individual: 60  minutes.    Signed by: [name], DPT 11/13/2019 12:00:00 PM

## 2019-11-13 NOTE — Rehab Progress Note ([facility]) (Signed)
Christy Mcintosh  MRN: [id number]  Account: [id number]  Session Start: 11/13/2019 10:00:00 AM  Session Stop: 11/13/2019 11:00:00 AM    Total Treatment Minutes: 60.00 Minutes    Psychology Services  Inpatient Rehabilitation Progress Note    Rehab Diagnosis: Monophasic Demyelinating Myeloencephalitis  Demographics:            Age: 52Y            Gender: Female    Medications and Allergies: Significant rehabilitation considerations:   see EPIC  Rehabilitation Precautions/Restrictions:   Fall Precaution and Skin Precaution      OBJECTIVE  Mental Status: Patient seen for  a follow-up psychological consultation for  adjustment to medical situation and rehabilitation hospitalization in accordance  with the attending physician?s initial plan of care. Christy Mcintosh was awake,  alert, and cooperative throughout the session. She presented sitting upright in  her hospital bed resting comfortably. Affect was WNL. Eye contact was good.  Thought process was linear and focused; content was appropriate. Speech was of  normal volume, rate, and prosody.  Processing speed was adequate for session.  There were no behavioral signs of pain during the assessment and the patient  denied significant pain at the time of this assessment. Patient was oriented to  person, place, time, and situation. Mood was reported as ?good.? There was no  evidence of response to auditory/visual stimuli or apparent psychosis. Patient  denies any suicidal ideation, intent, or plan.    Education Provided:  No education provided this session.    Interventions:   NPSY INDIVID HLTH INTERV INT   NPSY INDIVID HLTH INTERV ADD    ASSESSMENT  Impressions:  During this session, the patient discussed her rehabilitation  goals and adjustment to functional changes. Cognitive refocusing strategies were  used as well as goal-focused exploration. The client expressed confidence in her  ability to work toward her goals and feels that she will be ready for  discharge  when the time arrives. The client also expressed a good mood and continued  maintenance of adjustment to her recovery process.    PLAN  Continued Psychology services are recommended to address: Maintain healthy  adjustment to the rehabilitation process and decrease stress related to  functional changes.  Recommendations:  Individual and co-treat as needed    Signed by: Raynald Blend, PhD 11/13/2019 11:00:00 AM

## 2019-11-13 NOTE — Progress Notes (Signed)
NAME: Christy Mcintosh  MRN: [id number]  Account: [id number]  Session Start: 11/13/2019 12:00:00 AM  Session Stop: 11/13/2019 12:00:00 AM    Total Treatment Minutes:  Minutes    Physical Medicine and Rehabilitation  Initial Individualized Interdisciplinary Plan Of Care    Rehab Diagnosis: Monophasic Demyelinating Myeloencephalitis  Demographics:            Age: 33Y            Gender: Female    Plan Of Care  Anticipated Discharge Date/Estimated Length of Stay: 11/20/19  Anticipated Discharge Destination: Community discharge with assistance  Fall Risk Level: Green (Low)  Medical Necessity Expected Level Rationale: s/p colectomy with ileorctal  anastomosis, monophasic demyelinating disease,  seizure disorder  Intensity and Duration: an average of 3 hours/5 days per week  Medical Supervision and 24 Hour Rehab Nursing: x  Physical Therapy: x  PT Intensity/Duration: 60-151mins 5d/week for 1-2 weels  Occupational Therapy: x  OT Intensity/Duration: 60-14mins 5d/week for 1-2 weeks  Speech and Language Therapy: x  SLP Intensity/Duration: 60-166mins 5d/week for 1-2 weeks  Case Management: [name], CM  Nurse: [name], RN  Occupational Therapy: [name], OT  Physical Therapy: [name], PT  Physician: [name], DO  Therapeutic Recreation: [name], TR  Psychology Other: [name] NP  Speech Language Pathology Other: [name] SLP    The following is a list of patient problems that have been identified by the  interdisciplinary team:    Problem: Impaired Cognition  Cognition Status Update: Pt utilizes compensatory cognitive strategies provided  range of min to max in unfamiliar tasks, although cueing consistently decreases  near task completion.    Problem: Impaired Mobility  Mobility Status Update: Pt performs stand pivot transfers, gaits without AD 150'  , and negotiates up/down 12 steps with 1 HR with CGA/minA.  Team Identified Barrier to Discharge: Yes  Interventions:  Transfer  training: Active  Gait training: Active  Wheelchair propulsion and management: Active  Other Mobility Intervention 1 - Text: stair training  Other Mobility Intervention 1 - Status: Active  Mobility: Primary Team Goal/Status: Pt will perform all mobility with ModI with  LRAD for safe D/C home. / Active    Problem: Impaired Pain Management  Pain Mgmt: Primary Team Goal/Status: Patient will verbalize pain less than or  equal to 2 out of 10 with the use of medication and positioning techniques /    Problem: Impaired Psychosocial Skills/Behavior  PsychoSocial: Primary Team Goal/Status: Maintain healthy adjustment to the  rehabilitation process and decrease stress related to functional changes. /    Problem: Impaired Self-care Mgmt/ADL/IADL  Self Care/ADL/IADL Status Update: Pt completes self-care routine with casual  supervision to mod I with FWW and intermittent cues for safety  Team Identified Barrier to Discharge: Yes  Interventions:  Adaptive equipment training: Active  Compensatory strategies: Active  Encourage patient to participate in activities of daily living: Active  Self Care: Primary Team Goal/Status: Pt will complete ADL routine and household  transfers with mod I / Active    Problem: Impaired Skin/Wound Mgmt  Skin Wound: Primary Team Goal/Status: Patient  will independently perform weight  shifts to prevent skin breakdown /    Problem: Safety Risk and Restraint  Safety: Primary Team Goal/Status: Patient will follow safety precautions and  call for assistance 100% of the time during hospitalization /    Comments:    Signed by: [name], DO, MBA 11/13/2019 5:07:00 PM    Physician CoSigned By:  Lelon Mast Benjamin-Allen 11/13/2019 17:07:09

## 2019-11-13 NOTE — Rehab Progress Note ([facility]) (Signed)
NAMEMARIGRACE Mcintosh  MRN: [id number]  Account: [id number]  Session Start: 11/13/2019 12:00:00 AM  Session Stop: 11/13/2019 12:00:00 AM    Total Treatment Minutes:  Minutes    Rehabilitation Nursing  Inpatient Rehabilitation Shift Assessment    Rehab Diagnosis: Monophasic Demyelinating Myeloencephalitis  Demographics:            Age: 63Y            Gender: Female  Primary Language: English    Date of Onset:  09/12/19  Date of Admission: 11/06/2019 5:56:00 PM    Rehabilitation Precautions Restrictions:   Fall Precaution and Skin Precaution    Patient Report: " Iam ok"  Patient/Caregiver Goals:  "I want to get stronger and go back home"    Wounds/Incisions: No wounds or incisions.    Medication Review: No clinically significant medication issues identified this  shift.    Bowel and Bladder Output:                       Bladder (# only)             Bowel (# only)  Number of Episodes  Continent            3                            1  Incontinent          0                            0    Additional Education Provided:    Education Provided: Safety issues and interventions. Fall protocol. Supervision  requirements. Skin/wound care. Signs/symptoms of infection. Role of nutrition in  wound prevention/healing. Incision care. Critical pressure areas. Skin care for  the incontinent patient. Prevention of skin breakdown. Pain management. Pain  scale. Medication options. Side effects. Clinical indicators of pain.  Medication. Name and dosage. Administration. Purpose. Side Effects.  Anticoagulants. Administration of Lovenox injections.       Audience: Patient.       Mode: Explanation.       Response: Verbalized understanding.    TEAM CARE PLAN  Identified problems from team documentation:  Problem: Impaired Cognition    Problem: Impaired Mobility  Mobility: Primary Team Goal: Pt will perform all mobility with ModI with LRAD  for safe D/C home./Active    Problem: Impaired Pain Management  Pain Mgmt: Primary Team Goal: Patient will  verbalize pain less than or equal to  2 out of 10 with the use of medication and positioning techniques/    Problem: Impaired Psychosocial Skills/Behavior  PsychoSocial: Primary Team Goal: Maintain healthy adjustment to the  rehabilitation process and decrease stress related to functional changes./    Problem: Impaired Self-care Mgmt/ADL/IADL  Self Care: Primary Team Goal: Pt will complete ADL routine and household  transfers with mod I/Active    Problem: Impaired Skin/Wound Mgmt  Skin Wound: Primary Team Goal: Patient  will independently perform weight shifts  to prevent skin breakdown/    Problem: Safety Risk and Restraint  Safety: Primary Team Goal: Patient will follow safety precautions and call for  assistance 100% of the time during hospitalization/    Please review Integrated Patient View Care Plan Flowsheet for Team identified  Problems, Interventions, and Goals.    Signed by: Liana Gerold, RN 11/13/2019 12:58:00 AM

## 2019-11-13 NOTE — Rehab Progress Note ([facility]) (Signed)
Christy Mcintosh  MRN: [id number]  Account: [id number]  Session Start: 11/13/2019 7:00:00 AM  Session Stop: 11/13/2019 8:00:00 AM    Total Treatment Minutes: 60.00 Minutes    Occupational Therapy  Inpatient Rehabilitation Progress Note    Rehab Diagnosis: Monophasic Demyelinating Myeloencephalitis  Demographics:            Age: 41Y            Gender: Female  Rehabilitation Precautions/Restrictions:   Fall Precaution and Skin Precaution    SUBJECTIVE  Patient Report: "I'm trying to do things the safe way"  Pain: Patient currently without complaints of pain.    OBJECTIVE  Activities of Daily Living  Eating: 06 - Independent without an assistive device    Oral Hygiene: 06 - Independent without an assistive device    Shower/Bathe Self: 05 - Setup or Clean Up Assistance: Helper sets up or cleans  up prior to or following an activity  Location:  Shower.  Assistive Device(s):   Grab bar/arm rest to maintain balance, Hand held shower,  Shower chair    Upper Body Dressing:   06 - Independent with an assistive device  Assistive Device(s):   Assistive device for item retrieval    Lower Body Dressing: 04 - Supervision: Helper provides supervision for safety or  verbal cues ONLY  Assistive Device(s):   Assistive device for item retrieval/balance    Putting On/Taking Off Footwear: 06 - Independent without an assistive device    Toileting Hygiene: 06 - Independent with an assistive device  Toileting Equipment:   Toilet  Assistive Device(s):   Adaptive device to maintain balance    Toilet Transfer: 04 - Supervision: Helper provides supervision for safety or  verbal cues ONLY  Assistive Device(s):   Rolling walker    Picking Up Objects: 04 - Touching Assistance: Helper provides  touching/steadying/contact guard  Assistive Device(s:)   Assistive device to maintain balance    Interventions:       Self Care/Home Management:  Pt received supine in bed, alert, and agreeable  to OT tx. Pt transferred to EOB and ambulated to bathroom with  casual  supervision and FWW. 1 VC required for safe positioning of body during item  retrieval to prevent excess reach outside of BOS. No rest breaks required  throughout session and only 1 self- corrected LOB. Pt required facilitation from  therapist to simulate home environment and problem solve adaptations to daily  routine for safety and efficiency at home.  Patient was left seated in chair in his/her room. Handoff to nurse completed.  Oriented to call bell and placed within reach.  Personal items within reach.  Pain Reassessment: Pain was not reassessed as no pain was reported.    Education Provided:    Education Provided: Plan of care. Activities of daily living. Functional  transfers. Safety. Fall prevention/balance training.       Audience: Patient.       Mode: Explanation.  Demonstration.       Response: Applied knowledge.  Verbalized understanding.  Needs reinforcement.    ASSESSMENT  Pt progressing well toward mod I with FWW, however benefits from cueing to  improve overall safety and problem solving in novel environment. Pt will benefit  from ongoing dynamic balance training and IADL training in order to progress to  safe discharge home as close to PLOF as possible per pt goals.    Long Term Goals: Pt will complete ADL routine with mod I and DME/AE PRN  Pt will complete household transfers with mod I and LRAD  Pt will complete simple cleaning task with mod I and LRAD in order to return to  meaningful roles at home  Pt's husband will attend caregiver training and demonstrate independence with  providing necessary assist for safe discharge home.  7-10 days from IE (11/07/19)  Short Term Goals:    Progress Towards Goals: Pt is progressing well toward goals, completing at a  casual supervision level at this time. Benefits from intermittent cues for  safety and problem solving in novel environment to ensure fall prevention.  Anticipated to reach all goals by discharge.    PLAN  Continued Occupational Therapy  is recommended.  Recommended Frequency/Duration/Intensity: 60-120 min/day for 5-6 days/wk for  7-10 days from IE (11/07/19)  Continued Activities Contributing Toward Care Plan: Theract, therex, ADL/IADL  retraining, AE training, DME assessment/recommendation, modalities PRN,  caregiver training, fall prevention, household transfer training, discharge  planning  Patient is appropriate for treatment in mobility group. Patient will benefit  from group therapy to increase aerobic endurance capacity for ambulating longer  distances, to improve safety with use of appropriate AD while dividing  attention, improve negotiation of environmental barriers in a distracting  environment to simulate interaction with others in the community, provide  interaction with peers with similar diagnoses in order to improve coping and  therapeutic participation, receive support and constructive criticism from peers  in a group setting, practice and carry over skills learned in individual  sessions with new partners, clinicians and contexts    Care Plan  Identified problems from team documentation:  Problem: Impaired Cognition    Problem: Impaired Mobility  Mobility: Primary Team Goal: Pt will perform all mobility with ModI with LRAD  for safe D/C home./Active    Problem: Impaired Pain Management  Pain Mgmt: Primary Team Goal: Patient will verbalize pain less than or equal to  2 out of 10 with the use of medication and positioning techniques/    Problem: Impaired Psychosocial Skills/Behavior  PsychoSocial: Primary Team Goal: Maintain healthy adjustment to the  rehabilitation process and decrease stress related to functional changes./    Problem: Impaired Self-care Mgmt/ADL/IADL  Self Care: Primary Team Goal: Pt will complete ADL routine and household  transfers with mod I/Active    Problem: Impaired Skin/Wound Mgmt  Skin Wound: Primary Team Goal: Patient  will independently perform weight shifts  to prevent skin breakdown/    Problem: Safety  Risk and Restraint  Safety: Primary Team Goal: Patient will follow safety precautions and call for  assistance 100% of the time during hospitalization/    Add/Update Problems from this Treatment:  Update of existing problem:   Self Care Management: Pt completes self-care routine with casual supervision to  mod I with FWW and intermittent cues for safety    Discipline:  Occupational Therapy    Please review Integrated Patient View Care Plan Flowsheet for Team identified  Problems, Interventions, and Goals.    3 Hour Rule Minutes: 60 minutes of OT treatment this session count towards  intensity and duration of therapy requirement. Patient was seen for the full  scheduled time of OT treatment this session.  Therapy Mode Minutes: Individual: 60 minutes.    Signed by: [name], OTR/L 11/13/2019 8:00:00 AM

## 2019-11-13 NOTE — Progress Notes (Signed)
MEDICINE PROGRESS NOTE  [facility], DIVISION OF HOSPITALIST MEDICINE   [place] [facility]   [place] Pager: [phone]      Date Time: 11/13/19 9:54 AM  Patient Name: Christy Mcintosh  [facility] Phys[facility] Day[facility] 8  Assessment:     Active Hospital Problems    Diagnosis    Peripheral autoimmune demyelinating disease       58 year oldright hand dominantfemalewith PMHx lumbar radiculopathy, anemia, DMII, seizure disorder, hx craniotomy for meningioma resection on Vimpat, hypothyroidism, who was in her normal state of health until [facility]. She was taken to the OR on 09/12/19, and is now s/p total abdominal colectomy with ileorectal anastamosis. 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  She was transferred to [facility] on 10/11/2019; treatment appears to have included 5 plasma exchange treatment with significant improvement.  Patient transferred to acute rehab on 3/17.    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  -Monitor tolerance to current diet;  Continue Peri-Colace from scheduled dosing to as needed as patient reports she is having excessive loose stools  -Continue as needed simethicone as patient reports some gaseous discomfort which had previously improved with simethicone    History of DM type 2--HbA1C 5 0.0 on 3/18  -BG have been  well controlled with diet alone    HTN   BP currently well controlled  Continue losartan    Hyperlipidemia  Continue Crestor and Zetia    Hypothyroidism  Continue Synthroid    Normocytic anemia--suspect component of iron deficiency  -Start iron supplement with TSAT 11% on 11/08/2019,   Ferritin noted to be within normal limits at 32  -Noted normal B12 and folate levels on 3/19    Hx of narcolepsy?  -cont Adderall     Anxiety  -cont Cymbalta, also on evening xanax    Deep vein thrombosis prophylaxis with Lovenox     Case discussed with:Pt, staff including nursing.     Safety Checklist:     DVT prophylaxis:  CHEST guideline (See page [phone]) Chemical   Foley:  [place] R[facility] Foley protocol Not present   IVs:  Peripheral IV   PT/OT: Ordered     Lines:     Patient Lines/Drains/Airways Status    Active PICC Line / CVC Line / PIV Line / Drain / Airway / Intraosseous Line / Epidural Line / ART Line / Line / Wound / Pressure Ulcer / NG/OG Tube     None                 Disposition:     Today's date: 11/13/2019   Length of Stay: 7      Subjective     CC: Peripheral autoimmune demyelinating disease  Interval History/24 hour events: no events  Supervise For Meals Frequency: All meals  ENSURE COMPACT SUPPLEMENT Quantity: A. One; Frequency: BID (2 times a day) - with Breakfast and Dinner  Diet consistent carbohydrate Additional restrictions: LOW LACTOSE  HPI/Subjective: Patient seen and examined.  No events overnight.  Denies any chest pain or shortness of breath.  No nausea or vomiting.  No fevers.  She states that she has been doing well in physical therapy and continues have some left lower extremity weakness.    Review of Systems:   Review of Systems - Negative except as above in HPI    Physical Exam:     Temp:  [97.9 F (36.6 C)-98.6 F (37 C)] 98.1 F (36.7 C)  Heart Rate:  [86-93] 88  Resp Rate:  [16] 16  BP: (110-124)/(67-83) 124/67  Intake and Output Summary-last 24 Hrs:  I/O last 3 completed shifts:  In: 1280  [P.O.:1280]  Out: 2 [Urine:2]    No data recorded by O2 Device: None (Room air)     General: awake, alert ,in no acute distress  Cardiovascular: regular rate and rhythm, no murmurs  Lungs: clear to auscultation bilaterally, no additional sounds  Abdomen: soft, non-tender, non-distended; normoactive bowel sounds  Extremities: no edema  Neurological: Alert and oriented X 3, moves all extremities, 4/5 strength in dorsi and plantarflexion LLE        Meds:   Medications were reviewed:  Scheduled Meds:  Current Facility-Administered Medications   Medication Dose Route Frequency    acetaminophen  1,000 mg Oral TID    ALPRAZolam  2 mg Oral QHS    amphetamine-dextroamphetamine  20 mg Oral BID    DULoxetine  60 mg Oral Daily    enoxaparin  40 mg Subcutaneous Daily    ezetimibe  10 mg Oral QHS    ferrous sulfate  324 mg Oral QAM W/BREAKFAST    gabapentin  300 mg Oral Q8H SCH    lacosamide  150 mg Oral BID    levothyroxine  50 mcg Oral Daily at 0600    lidocaine  1 patch Transdermal Q24H    losartan  25 mg Oral Daily    pantoprazole  40 mg Oral Daily at 0600    rosuvastatin  40 mg Oral QHS    thiamine  100 mg Oral Daily    vitamin D (cholecalciferol)  25 mcg Oral Daily    zonisamide  200 mg Oral BID     Continuous Infusions:  PRN Meds:.acetaminophen, Nursing communication: Adult Hypoglycemia Treatment Algorithm **AND** dextrose **AND** dextrose **AND** glucagon (rDNA), methocarbamol, oxyCODONE, polyethylene glycol, senna-docusate, simethicone  Labs/Radiology:   Imaging personally reviewed, including: all available   No results found.  No results for input(s): GLUCOSEWB in the last 24 hours.  Recent Labs   Lab 11/11/19  0523 11/07/19  0700   Sodium 143 142   Potassium 4.1 4.1   Chloride 108 108   BUN 17 10   Creatinine 0.7 0.8   EGFR >60.0 >60.0   Glucose 74 100   Calcium 10.0 9.7     Recent Labs   Lab 11/11/19  0523 11/07/19  0700   WBC 3.61 3.60   Hgb 9.3* 9.0*   Hematocrit 28.7* 28.2*   Platelets 303 391*          Recent Labs   Lab 11/07/19  0700   Alkaline Phosphatase 74   Bilirubin, Total 0.2   ALT 18   AST (SGOT) 17             Signed by: Rod Holler Connors-McBride, DO

## 2019-11-13 NOTE — Rehab Progress Note (Medilinks) (Signed)
Christy Mcintosh  MRN: [id number]  Account: [id number]  Session Start: 11/13/2019 2:00:00 PM  Session Stop: 11/13/2019 3:00:00 PM    Total Treatment Minutes: 60.00 Minutes    Speech Language Pathology  Inpatient Rehabilitation Treatment Note    Rehab Diagnosis: Monophasic Demyelinating Myeloencephalitis  Demographics:            Age: 74Y            Gender: Female  Rehabilitation Precautions/Restrictions:   Fall Precaution and Skin Precaution    Interventions:       Speech Treatment: SLP intervention addressed planning while incorporating  cognitive pacing scale. SLP and patient reviewed gas tank visual, with included  examples correlating to each portion of gas tank (full / half tank / empty).  Provided Healing Touch after patient stated she was "at a half tank" to reduce  internal distractions and promote improved sustained attention to task.  Introduced boxed breathing techniques as an additional resource to use during  rest breaks and moments of cognitive fatigue.       Speech Treatment: Created a written mock schedule for home using hour  interval time sheet in order to aid with visual procesing and planning. Patient  required moderate verbal cues to recognize which daily tasks to perform and  length of time required for each task based off of fatigue and physical  limitations.  After reviewing schedule, patient was able to analyze when rest  breaks were needed throughout day due to fatigue from daily routines and  therapy.  Patient was returned to or left in bed. Oriented to call bell and placed within  reach.  Personal items within reach.    ASSESSMENT  Patient is beginning to advocate for rest breaks and pacing while creating  schedules for therapy and daily home routines.    3 Hour Rule Minutes: 60 minutes of SLP treatment this session count towards  intensity and duration of therapy requirement. Patient was seen for the full  scheduled time of SLP treatment this session.  Therapy Mode Minutes: Individual:  60 minutes.    Signed by: Orvan Seen, M.S., CCC-SLP 11/13/2019 3:00:00 PM

## 2019-11-13 NOTE — Rehab Progress Note (Medilinks) (Signed)
NAMEJEROLENE Mcintosh  MRN: [id number]  Account: [id number]  Session Start: 11/13/2019 8:00:00 AM  Session Stop: 11/13/2019 9:00:00 AM    Total Treatment Minutes: 60.00 Minutes    Speech Language Pathology  Inpatient Rehabilitation Treatment Note    Rehab Diagnosis: Monophasic Demyelinating Myeloencephalitis  Demographics:            Age: 44Y            Gender: Female  Rehabilitation Precautions/Restrictions:   Fall Precaution and Skin Precaution    Interventions:       Speech Treatment: SLP intervention addressed planning while incorporating  cognitive pacing scale. SLP and patient reviewed gas tank visual, with included  examples correlating to each portion of gas tank (full / half tank / empty).       Speech Treatment: Created a written schedule for today using hour interval  time sheet in order to aid with visual procesing and planning. Patient analyzed  when fatigue may be present, such as following two back to back therapy  sessions, and included as needed rest breaks. SLP provided min/ moderate verbal  cues in order to adequately pre-plan rest and ensure majority of daily events  were included such as med intake and meals. Pt benefitted from minimal reminders  to estimate time of completion for various activities, and differentiate timing  based on fatigue.    Patient was returned to or left in bed. Handoff to nurse completed.  Oriented to call bell and placed within reach.  Personal items within reach.    ASSESSMENT  Patient greatly benefitted from concrete pacing scale, and demonstrated good  carryover of gas tank visual. Patient was able to connect basic daily tasks to  different portions of gas tank / energy provided min/mod assist and improved  carryover as task continued. Pt is receiving additional ST session this  afternoon, and would benefit from continued focus on daily schedule mimicking  home routine.    3 Hour Rule Minutes: 60 minutes of SLP treatment this session count towards  intensity and duration  of therapy requirement. Patient was seen for the full  scheduled time of SLP treatment this session.  Therapy Mode Minutes: Individual: 60 minutes.    Signed by: [name], M.A., CCC/SLP 11/13/2019 9:00:00 AM

## 2019-11-13 NOTE — Rehab Progress Note (Medilinks) (Signed)
NAMEDESHELIA PIVIROTTO  MRN: [id number]  Account: [id number]  Session Start: 11/13/2019 12:00:00 AM  Session Stop: 11/13/2019 12:00:00 AM    Total Treatment Minutes:  Minutes    Rehabilitation Nursing  Inpatient Rehabilitation Shift Assessment    Rehab Diagnosis: Monophasic Demyelinating Myeloencephalitis  Demographics:            Age: 89Y            Gender: Female  Primary Language: English    Date of Onset:  09/12/19  Date of Admission: 11/06/2019 5:56:00 PM    Rehabilitation Precautions Restrictions:   Fall Precaution and Skin Precaution    Patient Report: "Good to see you."  Patient/Caregiver Goals:  "I want to get stronger and go back home"    Wounds/Incisions: No wounds or incisions.    Medication Review: No clinically significant medication issues identified this  shift.    Bowel and Bladder Output:                       Bladder (# only)             Bowel (# only)  Number of Episodes  Continent            3                            2  Incontinent          0                            0    Additional Education Provided:    Education Provided: Pain management. Pain scale. Medication options. Safety  issues and interventions. Fall protocol. Supervision requirements. Use of  adaptive devices. Nutrition/feeding. Dietary recommendations. Oral feeding.  Skin/wound care. Critical pressure areas. Prevention of skin breakdown.  Functional transfers. Safety. Medication. Name and dosage. Administration.  Purpose. Side Effects. Labs. Anticoagulants. Diabetes. Medication. Glucose  testing. Hypoglycemia causes/symptoms/treatment. Hyperglycemia  causes/symptoms/treatment. Diet management.       Audience: Patient.       Mode: Explanation.       Response: Verbalized understanding.  Needs reinforcement.    TEAM CARE PLAN  Identified problems from team documentation:  Problem: Impaired Cognition    Problem: Impaired Mobility  Mobility: Primary Team Goal: Pt will perform all mobility with ModI with LRAD  for safe D/C  home./Active    Problem: Impaired Pain Management  Pain Mgmt: Primary Team Goal: Patient will verbalize pain less than or equal to  2 out of 10 with the use of medication and positioning techniques/    Problem: Impaired Psychosocial Skills/Behavior  PsychoSocial: Primary Team Goal: Maintain healthy adjustment to the  rehabilitation process and decrease stress related to functional changes./    Problem: Impaired Self-care Mgmt/ADL/IADL  Self Care: Primary Team Goal: Pt will complete ADL routine and household  transfers with mod I/Active    Problem: Impaired Skin/Wound Mgmt  Skin Wound: Primary Team Goal: Patient  will independently perform weight shifts  to prevent skin breakdown/    Problem: Safety Risk and Restraint  Safety: Primary Team Goal: Patient will follow safety precautions and call for  assistance 100% of the time during hospitalization/    Please review Integrated Patient View Care Plan Flowsheet for Team identified  Problems, Interventions, and Goals.    Signed by: Bosie Clos, RN 11/13/2019 7:38:00 PM

## 2019-11-13 NOTE — Progress Notes (Addendum)
PROGRESS NOTE -- FACE-TO-FACE ENCOUNTER  PHYSICAL MEDICINE AND REHABILITATION      Date Time: 11/13/19 12:15 PM  Patient Name: Christy Mcintosh, Christy Mcintosh    Admission date:  11/06/2019  Functional Status/Update:   I reviewed patient's therapy notes to assess functional status and ongoing need for therapies.     PT: Focus on rollator safety and use.  Gait training  with rollator with close S/occ CGA up to 150' bouts x 4, very narrow BOS and occ  scissoring.  Curb training 4'' with rollator with mina for balance and rollator  management.     OT: Eating: 06 - Independent without an assistive device    Oral Hygiene: 06 - Independent without an assistive device    Shower/Bathe Self: 05 - Setup or Clean Up Assistance: Helper sets up or cleans  up prior to or following an activity  Location:  Shower.  Assistive Device(s):   Grab bar/arm rest to maintain balance, Hand held shower,  Shower chair    Upper Body Dressing:   06 - Independent with an assistive device  Assistive Device(s):   Assistive device for item retrieval    Lower Body Dressing: 04 - Supervision: Helper provides supervision for safety or  verbal cues ONLY  Assistive Device(s):   Assistive device for item retrieval/balance    Putting On/Taking Off Footwear: 06 - Independent without an assistive device    Toileting Hygiene: 06 - Independent with an assistive device  Toileting Equipment:   Toilet  Assistive Device(s):   Adaptive device to maintain balance    Toilet Transfer: 04 - Supervision: Helper provides supervision for safety or  verbal cues ONLY  Assistive Device(s):   Rolling walker    Picking Up Objects: 04 - Touching Assistance: Helper provides  touching/steadying/contact guard  Assistive Device(s:)   Assistive device to maintain balance    SLP: SLP intervention addressed planning while incorporating  cognitive pacing scale. SLP and patient reviewed gas tank visual, with included  examples correlating to each portion of gas tank (full / half tank /  empty).       Speech Treatment: Created a written schedule for today using hour interval  time sheet in order to aid with visual procesing and planning. Patient analyzed  when fatigue may be present, such as following two back to back therapy  sessions, and included as needed rest breaks. SLP provided min/ moderate verbal  cues in order to adequately pre-plan rest and ensure majority of daily events  were included such as med intake and meals. Pt benefitted from minimal reminders  to estimate time of completion for various activities, and differentiate timing  based on fatigue.    Subjective/ROS:   Patient was seen and examined today. No acute issues overnight. She is very appreciative of her diet upgrade and eating much better.  Diarrhea occurs 2-3 times a day, as opposed to 4-5 times a day as she was experiencing on admission.  She was encouraged to eat small meals more frequently, than large meals in one sitting due to the nature of her bowel surgery.  She denies complaints.  Discussed eye drops for dry eyes.  Medications:   Medications reviewed by me:     Scheduled Meds: PRN Meds:    acetaminophen, 1,000 mg, Oral, TID  ALPRAZolam, 2 mg, Oral, QHS  amphetamine-dextroamphetamine, 20 mg, Oral, BID  carboxymethylcellulose (PF), 1 drop, Left Eye, BID  DULoxetine, 60 mg, Oral, Daily  enoxaparin, 40 mg, Subcutaneous, Daily  ezetimibe, 10 mg, Oral,  QHS  ferrous sulfate, 324 mg, Oral, QAM W/BREAKFAST  gabapentin, 300 mg, Oral, Q8H SCH  lacosamide, 150 mg, Oral, BID  levothyroxine, 50 mcg, Oral, Daily at 0600  lidocaine, 1 patch, Transdermal, Q24H  losartan, 25 mg, Oral, Daily  pantoprazole, 40 mg, Oral, Daily at 0600  rosuvastatin, 40 mg, Oral, QHS  thiamine, 100 mg, Oral, Daily  vitamin D (cholecalciferol), 25 mcg, Oral, Daily  zonisamide, 200 mg, Oral, BID        Continuous Infusions:   acetaminophen, 650 mg, Q6H PRN  dextrose, 15 g of glucose, PRN   And  dextrose, 12.5 g, PRN   And  glucagon (rDNA), 1 mg,  PRN  methocarbamol, 750 mg, TID PRN  oxyCODONE, 5 mg, Q6H PRN  polyethylene glycol, 17 g, Daily PRN  senna-docusate, 2 tablet, BID PRN  simethicone, 80 mg, Q6H PRN              Physical Exam:     Vitals:    11/12/19 1629 11/13/19 0501 11/13/19 0809 11/13/19 1117   BP: 110/76 119/83 124/67 120/88   Pulse: 93 86 88 80   Resp: 16 16 16     Temp: 98.6 F (37 C) 97.9 F (36.6 C) 98.1 F (36.7 C)    TempSrc: Oral Oral Oral    SpO2: 100%  98%    Weight:       Height:         General: NAD, well nourished  HEENT: NC/AT, mouth mucosa pink and moist  Heart: RRR  Lungs: CTAB  Abdomen: Soft, non-tender, +BS  Extremities: No edema, palpable pulses  Neuro: AAOx3, speech fluent    Intake and Output Summary (Last 24 hours) at Date Time    Intake/Output Summary (Last 24 hours) at 11/13/2019 1215  Last data filed at 11/13/2019 1018  Gross per 24 hour   Intake 1160 ml   Output 2 ml   Net 1158 ml     P.O.: 340 mL (11/13/19 0900)     Urine: 1 mL(unmeasured output) (11/13/19 1018)               Labs:   No results for input(s): GLUCOSEWHOLE in the last 24 hours.    Recent Labs   Lab 11/11/19  0523 11/07/19  0700   WBC 3.61 3.60   Hgb 9.3* 9.0*   Hematocrit 28.7* 28.2*   Platelets 303 391*        Recent Labs   Lab 11/11/19  0523 11/07/19  0700   Sodium 143 142   Potassium 4.1 4.1   Chloride 108 108   CO2 24 25   BUN 17 10   Creatinine 0.7 0.8   Calcium 10.0 9.7   Albumin  --  4.6   Protein, Total  --  6.8   Bilirubin, Total  --  0.2   Alkaline Phosphatase  --  74   ALT  --  18   AST (SGOT)  --  17   Glucose 74 100             Results     ** No results found for the last 24 hours. **             Rads:   Radiological Procedure reviewed.  Radiology Results (24 Hour)     ** No results found for the last 24 hours. **          Assessment/Plan:     Continue PT, OT in acute  rehabilitation medical setting.  Continue comprehensive and intensive inpatient rehab program, including: Physical therapy 60-120 min daily, 5-6 times per week, Occupational  therapy 60-120 min daily, 5-6 times per week, Case management and Rehabilitation nursing for the following:    Peripheral autoimmune demyelinating disease.   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 Overall; clinically a monophasic demyelinating disease  Outpatient Neurology Appointment scheduled at [facility] with Dr. 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, GERD, esophageal stricture, IBS): Diarrhea now resolved  3/23 [place]'d Marinol  PO low fiber diet-->upgraded to consistent carb 3/23 per patient request  Miralax PRN for constipation    #Hypercholesterolemia: Zetia    #Pain Management (Hx lumbar radiculopathy, fibromyalgia): Tylenol, Cymbalta, and Lidoderm patches    #Anemia: Monitor CBC  Continue iron and Vitamin C supplement    #DMII: Hypoglycemic on admission, SSI coverage only for now  Medicine consulted to follow    #Hypothyroidism: Synthroid    #DVT Prophylaxis: Use Lovenox    #Bowel/Bladder Management: PVRs to rule out urinary retention. Use Senna and Colace    #Skin Care: Keep skin clean and dry    #Psych: Psychology services for evaluation and treatment as needed for adjustment/coping     #FEN/GI: Upgraded from low fiber to consistent carb diet. Patient had poor appetite on low fiber diet. Nutrition  consult  Protonix for GI prophylaxis    #Dispo:  to home 11/20/19  Team conference was held 11/12/19. Patient' status and progress  discussed at length today with the interdisciplinary team during team conference ;report with detailed status, progress, and goals will be placed in Medilinks     Face to face discussion with the patient and coordination of care  took more than  35 minutes; more than 50% of time was spent on coordination of care with interdisciplinary team including physical therapist, occupational therapist, SLP, recreational therapist ,rehabilitation nursing, dietician , neuropsychologist ,and case manager     Medication Review  A complete drug regimen review was completed: Yes  A complete drug regimen review was completed and no drug issues were found.      Discussed plan with patient. All questions were answered.  Signed by: Clover Mealy DO    [facility]

## 2019-11-14 LAB — CBC
Absolute NRBC: 0 10*3/uL (ref 0.00–0.00)
Hematocrit: 32.3 % — ABNORMAL LOW (ref 34.7–43.7)
Hgb: 10.1 g/dL — ABNORMAL LOW (ref 11.4–14.8)
MCH: 30.3 pg (ref 25.1–33.5)
MCHC: 31.3 g/dL — ABNORMAL LOW (ref 31.5–35.8)
MCV: 97 fL — ABNORMAL HIGH (ref 78.0–96.0)
MPV: 9.2 fL (ref 8.9–12.5)
Nucleated RBC: 0 /100 WBC (ref 0.0–0.0)
Platelets: 368 10*3/uL — ABNORMAL HIGH (ref 142–346)
RBC: 3.33 10*6/uL — ABNORMAL LOW (ref 3.90–5.10)
RDW: 15 % (ref 11–15)
WBC: 3.78 10*3/uL (ref 3.10–9.50)

## 2019-11-14 LAB — BASIC METABOLIC PANEL
Anion Gap: 10 (ref 5.0–15.0)
BUN: 20 mg/dL — ABNORMAL HIGH (ref 7–19)
CO2: 24 mEq/L (ref 22–29)
Calcium: 9.8 mg/dL (ref 8.5–10.5)
Chloride: 109 mEq/L (ref 100–111)
Creatinine: 0.8 mg/dL (ref 0.6–1.0)
Glucose: 91 mg/dL (ref 70–100)
Potassium: 4.3 mEq/L (ref 3.5–5.1)
Sodium: 143 mEq/L (ref 136–145)

## 2019-11-14 LAB — GFR: EGFR: >60

## 2019-11-14 MED ORDER — CARBOXYMETHYLCELLULOSE SODIUM 0.5 % OP SOLN
1.00 [drp] | Freq: Two times a day (BID) | OPHTHALMIC | 0 refills | Status: AC | PRN
Start: 2019-11-14 — End: ?

## 2019-11-14 MED ORDER — NALOXONE HCL 4 MG/0.1ML NA LIQD
NASAL | 0 refills | Status: AC
Start: 2019-11-14 — End: ?

## 2019-11-14 MED ORDER — OXYCODONE HCL 5 MG PO TABS
5.00 mg | ORAL_TABLET | Freq: Four times a day (QID) | ORAL | 0 refills | Status: AC | PRN
Start: 2019-11-14 — End: 2019-11-21

## 2019-11-14 MED ORDER — THIAMINE HCL 100 MG PO TABS
100.00 mg | ORAL_TABLET | Freq: Every day | ORAL | 0 refills | Status: AC
Start: 2019-11-15 — End: ?

## 2019-11-14 MED ORDER — ACETAMINOPHEN 325 MG PO TABS
650.00 mg | ORAL_TABLET | Freq: Four times a day (QID) | ORAL | Status: AC | PRN
Start: 2019-11-14 — End: ?

## 2019-11-14 MED ORDER — EZETIMIBE 10 MG PO TABS
10.00 mg | ORAL_TABLET | Freq: Every evening | ORAL | 0 refills | Status: AC
Start: 2019-11-14 — End: ?

## 2019-11-14 MED ORDER — AMPHETAMINE-DEXTROAMPHETAMINE 5 MG PO TABS
20.00 mg | ORAL_TABLET | Freq: Two times a day (BID) | ORAL | 0 refills | Status: AC
Start: 2019-11-15 — End: ?

## 2019-11-14 MED ORDER — LEVOTHYROXINE SODIUM 50 MCG PO TABS
50.00 ug | ORAL_TABLET | Freq: Every day | ORAL | 0 refills | Status: AC
Start: 2019-11-15 — End: ?

## 2019-11-14 MED ORDER — LOSARTAN POTASSIUM 25 MG PO TABS
25.00 mg | ORAL_TABLET | Freq: Every day | ORAL | 0 refills | Status: AC
Start: 2019-11-15 — End: ?

## 2019-11-14 MED ORDER — ROSUVASTATIN CALCIUM 40 MG PO TABS
40.00 mg | ORAL_TABLET | Freq: Every evening | ORAL | 0 refills | Status: AC
Start: 2019-11-14 — End: ?

## 2019-11-14 MED ORDER — LACOSAMIDE 150 MG PO TABS
150.00 mg | ORAL_TABLET | Freq: Two times a day (BID) | ORAL | 0 refills | Status: AC
Start: 2019-11-14 — End: ?

## 2019-11-14 MED ORDER — DULOXETINE HCL 60 MG PO CPEP
60.00 mg | ORAL_CAPSULE | Freq: Every day | ORAL | 0 refills | Status: AC
Start: 2019-11-15 — End: ?

## 2019-11-14 MED ORDER — SIMETHICONE 80 MG PO CHEW
80.00 mg | CHEWABLE_TABLET | Freq: Four times a day (QID) | ORAL | 0 refills | Status: AC | PRN
Start: 2019-11-14 — End: ?

## 2019-11-14 MED ORDER — GABAPENTIN 300 MG PO CAPS
300.00 mg | ORAL_CAPSULE | Freq: Three times a day (TID) | ORAL | 0 refills | Status: AC
Start: 2019-11-14 — End: ?

## 2019-11-14 MED ORDER — LIDOCAINE 5 % EX PTCH
1.00 | MEDICATED_PATCH | CUTANEOUS | 0 refills | Status: AC
Start: 2019-11-14 — End: ?

## 2019-11-14 MED ORDER — PANTOPRAZOLE SODIUM 40 MG PO TBEC
40.00 mg | DELAYED_RELEASE_TABLET | Freq: Every day | ORAL | 0 refills | Status: AC
Start: 2019-11-15 — End: ?

## 2019-11-14 MED ORDER — ALPRAZOLAM 2 MG PO TABS
2.00 mg | ORAL_TABLET | Freq: Every evening | ORAL | 0 refills | Status: AC
Start: 2019-11-14 — End: ?

## 2019-11-14 MED ORDER — METHOCARBAMOL 750 MG PO TABS
750.00 mg | ORAL_TABLET | Freq: Three times a day (TID) | ORAL | 0 refills | Status: AC | PRN
Start: 2019-11-14 — End: ?

## 2019-11-14 MED ORDER — FERROUS SULFATE 324 (65 FE) MG PO TBEC
324.00 mg | DELAYED_RELEASE_TABLET | Freq: Every morning | ORAL | 0 refills | Status: AC
Start: 2019-11-15 — End: ?

## 2019-11-14 MED ORDER — CHOLECALCIFEROL 25 MCG (1000 UT) PO TABS
25.00 ug | ORAL_TABLET | Freq: Every day | ORAL | 0 refills | Status: AC
Start: 2019-11-15 — End: ?

## 2019-11-14 MED ORDER — ZONISAMIDE 100 MG PO CAPS
200.00 mg | ORAL_CAPSULE | Freq: Two times a day (BID) | ORAL | 0 refills | Status: AC
Start: 2019-11-14 — End: ?

## 2019-11-14 NOTE — Rehab Progress Note (Medilinks) (Signed)
[name]  MRN: [id number]  Account: [id number]  Session Start: 11/14/2019 12:00:00 AM  Session Stop: 11/14/2019 12:00:00 AM    Total Treatment Minutes:  Minutes    Therapeutic Recreation  Inpatient Rehabilitation Exception Note    Patient was unable to complete planned session for the following reasons:   pt declined session due c/o stomach pain  Will attempt to see patient as tolerated and appropriate    Signed by: [name], CTRS 11/14/2019 10:00:00 AM

## 2019-11-14 NOTE — Progress Notes (Signed)
Chaplain Service      Background:  Visit Type: Initial was made by Chaplain with patient, Christy Mcintosh, based on Source: Chaplain Initiated.  Present at Visit: patient.  Spiritual Care Provided to: patient only.  Length of Visit: 0-15 minutes .    Summary:  Spiritual Care Interventions: Provided reflective and compassionate listening, Provided emotional support  Reason for Request: Emotional Support, Spiritual Support   Spiritual Care Outcomes: Patient expressed appreciation of visit

## 2019-11-14 NOTE — Rehab Progress Note (Medilinks) (Signed)
[name]  MRN: [id number]  Account: [id number]  Session Start: 11/14/2019 3:00:00 PM  Session Stop: 11/14/2019 4:00:00 PM    Total Treatment Minutes: 60.00 Minutes    Physical Therapy  Inpatient Rehabilitation Treatment Note    Rehab Diagnosis: Monophasic Demyelinating Myeloencephalitis  Demographics:            Age: 44Y            Gender: Female  Rehabilitation Precautions/Restrictions:   Fall Precaution and Skin Precaution    OBJECTIVE  Gait training with rollator with casual S to and from room.  FGA 14/30 most  difficulty with tandem gait and pt uses and rollator.  Performed stair training  with S with BHRS, step over step.  Performed gait training with SPC 200' bouts  with close S/occ CGA for balance.        Interventions:       Therapeutic Activities:  Gait training with rollator with casual S to and  from room.  FGA 15/30 most difficulty with tandem gait and pt uses and rollator.   Performed stair training with S with BHRS, step over step.  Performed gait  training with SPC 200' bouts with close S/occ CGA for balance.  Patient was returned to or left in bed. Handoff to nurse completed.  Oriented to call bell and placed within reach.  Personal items within reach.  Assistive devices positioned out of reach.  Bed placed in lowest position.    Functional Gait Assessment  1. GAIT LEVEL SURFACE grading category:  2-Mild Impairment 2. CHANGE IN GAIT  SPEED grading category:  1-Moderate Impairment 3. GAIT WITH HORIZONTAL HEAD  TURNS grading category:  1-Moderate Impairment 4. GAIT WITH VERTICAL HEAD TURNS   grading category:  2-Mild Impairment 5. GAIT AND PIVOT TURN grading category:  2-Mild Impairment 6. STEP OVER OBSTACLE grading category:  1-Moderate Impairment  7. GAIT WITH NARROW BASE OF SUPPORT grading category:  0-Severe Impairment 8.  GAIT WITH EYES CLOSED grading category:  2-Mild Impairment 9. AMBULATING  BACKWARDS grading category:  2-Mild Impairment 10. STEPS grading category:  2-Mild Impairment  15  Walker, M.L. (2007). Reference Group Data for the Functional Gait Assessment.  Physical Therapy ([id number]  ASSESSMENT  Improved motor control noted with rollator or SPC vs no AD.  Pt scored below  average on the FGA, making her a fall risk.  Pt had most difficulty with change  in gait speed, turns, and narrow BOS.  Rollator is safest and makes pt most  independent at this time.  Will continue POC.    3 Hour Rule Minutes: 60 minutes of PT treatment this session count towards  intensity and duration of therapy requirement. Patient was seen for the full  scheduled time of PT treatment this session.  Therapy Mode Minutes: Individual: 60 minutes.    Signed by: [name], DPT 11/14/2019 4:00:00 PM

## 2019-11-14 NOTE — Rehab Discharge Summary (Medilinks) (Signed)
Christy Mcintosh  MRN: [id number]  Account: [id number]  Session Start: 11/14/2019 12:00:00 AM  Session Stop: 11/14/2019 12:00:00 AM    Total Treatment Minutes:  Minutes    Case Management  Inpatient Rehabilitation Discharge Summary    Rehab Diagnosis: Monophasic Demyelinating Myeloencephalitis  Demographics:            Age: 59Y            Gender: Female    Past Medical History: Interstitial Cystitis  DM  HTN  Hypothyroidism  Asthma  GERD/IBS  Seizures D/O  Fibromyalgia  DDD Lumbar Spine  Overactive Bladder  Chronic Constipation  GI bleed  Esophageal Stricture    PSH:  Appendectomy  Bladder sling  Hysterectomy  BS and ovarian removal  Craniotomy secondary to meningioma  Breast Bx's  Bilateral knee meniscus surgeries  Cataract Surgeries  Rhinoplasty  Rotator cuff surgery  History of Present Illness: Christy Mcintosh is a 59 year old female originally  admitted to [facility] in January 2021 with colonic  inertia.  She was taken to the OR on 09/12/19 for Colonic Inertia, S/P Total  abdominal Colectomy with ileorectal anastamosis. Post op complicated by ilieus  and was transferred to [facility] on 10/11/19 for further treatment of post op needs  and new dx of subacute progressive myelopathy requiring Neurology consult.  The  patient was also receiving TPN, now discontinued and the patient is tolerating  diet well with improved intake. On Marinol-appetite stimulant. Regular diet with  Ensure BID.+BM.  Regarding monophasic Autoimmune Demyelinating  Myeloencephalitis-presented with subacute progressive myelopathy on 10/03/19  with BLE paraparesis and hyporeflexia.  Imaging significant for transverse  myelitis with patchy, non diffusing T2 abnormalities involving the corticospinal  tracts.  Completed 5/5 PLEX started on 09/25/19 with improvement noted.  EMG  completed on 10/14/19 without s/o of neuropathy.  PM+R consult completed as well  as neurology consult.  The patient was also followed for the following  acute  medical conditions: UTI/Cystitis- Started on Nitrofurontinin SR 100 mg BID  Started on March 10th, myeloradicular lumbar back pain- continues on Tylenol,  Cymbalta, Robaxin, and Lidoderm patches, Anemia-Continue Iron and monitor H+H  6.  MRSA Precautions-Resolved, Testing negative from 10/23/19, pt removed from  serial precautions. Hx of DM, Continue Insulin as ordered. Seizure Disorder, Hx  of Craniotomy for Meningioma resection.  Continues on PO Vimpat.   Hypothyroidism-Continue on PO Synthroid.  The patient is alert and oriented x4,  pleasant and cooperative with care needs. Tolerating regular diet well, + BM.  Paraparesis improving and the patient is working well with therapy services with  progress made.  The patient has been working well with therapy services to  include PT and OT to address functional mobility, self care deficit, and pain.  The patient would benefit from an intensive level of rehab along with close  medical management of multiple medical conditions and rehab MD to drive the  rehab process.  The patient is medically stable for IRF level of care.   Date of Onset:  09/12/19   Date of Admission: 11/06/2019 5:56:00 PM    Discharge Date: Saturday, November 16, 2019 to Home ([place])  Transportation: Spouse will transport patient and himself via private vehicle to  [employer] for a scheduled flight back to their home in [place]  Discharge Plan: Patient was discharged with a Out-Pt prescription for PT/OT  Community Resources: None    Follow Up Appointment(s):    Virtual appointment scheduled for  Monday, April 5 at 2:00 pm    [name]  Colorectal Surgery  [facility]    Virtual appointment scheduled for Tuesday, April 6    [name] [facility]  Neurology  [facility]      Renotification of Medicare Important Message: The Renotification of Medicare  Important Message letter was issued.    Signed by: [name], MSW 11/14/2019 4:24:00 PM

## 2019-11-14 NOTE — Rehab Progress Note (Medilinks) (Signed)
NAMEKRISTOL Mcintosh  MRN: [id number]  Account: [id number]  Session Start: 11/14/2019 12:00:00 AM  Session Stop: 11/14/2019 12:00:00 AM    Total Treatment Minutes:  Minutes    Rehabilitation Nursing  Inpatient Rehabilitation Shift Assessment    Rehab Diagnosis: Monophasic Demyelinating Myeloencephalitis  Demographics:            Age: 40Y            Gender: Female  Primary Language: English    Date of Onset:  09/12/19  Date of Admission: 11/06/2019 5:56:00 PM    Rehabilitation Precautions Restrictions:   Fall Precaution and Skin Precaution    Patient Report: " i'm doing well, i have some back pain"  Patient/Caregiver Goals:  "I want to get stronger and go back home"    Wounds/Incisions: No wounds or incisions.    Medication Review: No clinically significant medication issues identified this  shift.    Bowel and Bladder Output:                       Bladder (# only)             Bowel (# only)  Number of Episodes  Continent            2                            0  Incontinent          0                            0    Additional Education Provided:    Education Provided: Precautions. Pain management. Pain scale. Medication  options. Side effects. Clinical indicators of pain. Safety issues and  interventions. Supervision requirements. Use of adaptive devices. Fall  prevention/balance training. Safety. Medication. Name and dosage.  Administration. Purpose. Side Effects. Interaction. Labs. Administration of  Lovenox injections. Cardiac. Monitoring heart rate. Monitoring blood pressure.  Diabetes. Medication. Glucose testing. Insulin drawing/mixing/administration.  Diet management. Hypoglycemia causes/symptoms/treatment. Hyperglycemia  causes/symptoms/treatment.       Audience: Patient.       Mode: Explanation.       Response: Verbalized understanding.    TEAM CARE PLAN  Identified problems from team documentation:  Problem: Impaired Cognition    Problem: Impaired Mobility  Mobility: Primary Team Goal: Pt will perform all  mobility with ModI with LRAD  for safe D/C home./Active    Problem: Impaired Pain Management  Pain Mgmt: Primary Team Goal: Patient will verbalize pain less than or equal to  2 out of 10 with the use of medication and positioning techniques/    Problem: Impaired Psychosocial Skills/Behavior  PsychoSocial: Primary Team Goal: Maintain healthy adjustment to the  rehabilitation process and decrease stress related to functional changes./    Problem: Impaired Self-care Mgmt/ADL/IADL  Self Care: Primary Team Goal: Pt will complete ADL routine and household  transfers with mod I/Active    Problem: Impaired Skin/Wound Mgmt  Skin Wound: Primary Team Goal: Patient  will independently perform weight shifts  to prevent skin breakdown/    Problem: Safety Risk and Restraint  Safety: Primary Team Goal: Patient will follow safety precautions and call for  assistance 100% of the time during hospitalization/    Please review Integrated Patient View Care Plan Flowsheet for Team identified  Problems, Interventions, and Goals.    Signed by:  Christy Mensch, RN 11/14/2019 3:45:00 AM

## 2019-11-14 NOTE — Progress Notes (Addendum)
PROGRESS NOTE -- FACE-TO-FACE ENCOUNTER  PHYSICAL MEDICINE AND REHABILITATION      Date Time: 11/14/19 7:23 PM  Patient Name: Christy Mcintosh, Christy Mcintosh    Admission date:  11/06/2019  Functional Status/Update:   I reviewed patient's therapy notes to assess functional status and ongoing need for therapies.     PT: Gait training with rollator with casual S to and  from room.  FGA 15/30 most difficulty with tandem gait and pt uses and rollator.   Performed stair training with S with BHRS, step over step.  Performed gait  training with SPC 200' bouts with close S/occ CGA for balance.    SLP: SLP intervention addressed planning while incorporating  cognitive pacing scale. SLP and patient reviewed gas tank visual, with included  examples correlating to each portion of gas tank (full / half tank / empty).       Speech Treatment: Created a written schedule for today using hour interval  time sheet in order to aid with visual procesing and planning. Patient analyzed  when fatigue may be present, such as following two back to back therapy  sessions, and included as needed rest breaks. SLP provided min/ moderate verbal  cues in order to adequately pre-plan rest and ensure majority of daily events  were included such as med intake and meals. Pt benefitted from minimal reminders  to estimate time of completion for various activities, and differentiate timing  based on fatigue.    OT: Pt received semi-supine in bed with MD present  at start of session. Engaged in collaborative discussion regarding discharge  plan, as pt attempted to coordinate discharge for tomorrow vs. recommended  discharge date. Educated pt on realistic expectations, necessary paperwork and  coordination involved in discharge, and recommended alternative. Pt resistant  overall, however treatment team reinforced information. Pt ambulated with SPC  within room and >567ft to retrieve clothes and to access laundry machines with  close supervision and cues to reduce  distractions, as pt noted to have increased  staggering when simultaneously engaging in conversation. Completed laundry as  well as washed dishes in standing with supervision while relying on  countertop/work surface for support. Educated on compensatory strategies and pt  completed mass repetitions of lateral stepping along counter to place dishes  into drying rack. Pt able to retrieve items from floor and out of low cabinets  with CGA for safety and 0 LOB.    Subjective/ROS:   Patient was seen and examined today. She is doing well and requests discharge tomorrow. I explained the importance of a proper and smooth discharge to allow the team the time to organize all she needs.  She is discharging to [place], [place].  We compromised on moving her discharge up to Saturday out of caution, as her husband also has to travel via air to pick her up.  Medications:   Medications reviewed by me:     Scheduled Meds: PRN Meds:    acetaminophen, 1,000 mg, Oral, TID  ALPRAZolam, 2 mg, Oral, QHS  amphetamine-dextroamphetamine, 20 mg, Oral, BID  carboxymethylcellulose (PF), 1 drop, Left Eye, BID  DULoxetine, 60 mg, Oral, Daily  enoxaparin, 40 mg, Subcutaneous, Daily  ezetimibe, 10 mg, Oral, QHS  ferrous sulfate, 324 mg, Oral, QAM W/BREAKFAST  gabapentin, 300 mg, Oral, Q8H SCH  lacosamide, 150 mg, Oral, BID  levothyroxine, 50 mcg, Oral, Daily at 0600  lidocaine, 1 patch, Transdermal, Q24H  losartan, 25 mg, Oral, Daily  pantoprazole, 40 mg, Oral, Daily at 0600  rosuvastatin, 40  mg, Oral, QHS  thiamine, 100 mg, Oral, Daily  vitamin D (cholecalciferol), 25 mcg, Oral, Daily  zonisamide, 200 mg, Oral, BID        Continuous Infusions:   acetaminophen, 650 mg, Q6H PRN  dextrose, 15 g of glucose, PRN   And  dextrose, 12.5 g, PRN   And  glucagon (rDNA), 1 mg, PRN  methocarbamol, 750 mg, TID PRN  oxyCODONE, 5 mg, Q6H PRN  polyethylene glycol, 17 g, Daily PRN  senna-docusate, 2 tablet, BID PRN  simethicone, 80 mg, Q6H PRN              Physical Exam:      Vitals:    11/13/19 1655 11/14/19 0621 11/14/19 0915 11/14/19 1554   BP: 111/75 115/71 112/78 108/77   Pulse: 82 79 94 86   Resp: 16 16  16    Temp: 97.7 F (36.5 C) 98.4 F (36.9 C)  98.1 F (36.7 C)   TempSrc: Oral Oral  Oral   SpO2: 100% 100% 100% 100%   Weight:       Height:         General: NAD, well nourished  HEENT: NC/AT, mouth mucosa pink and moist  Heart: RRR  Lungs: CTAB  Abdomen: Soft, non-tender, +BS  Extremities: No edema, palpable pulses  Neuro: AAOx3, speech fluent    Intake and Output Summary (Last 24 hours) at Date Time    Intake/Output Summary (Last 24 hours) at 11/14/2019 1923  Last data filed at 11/14/2019 1700  Gross per 24 hour   Intake 880 ml   Output 1 ml   Net 879 ml     P.O.: 240 mL (11/14/19 1700)     Urine: 1 mL (11/13/19 2106)               Labs:   No results for input(s): GLUCOSEWHOLE in the last 24 hours.    Recent Labs   Lab 11/14/19  0515 11/11/19  0523   WBC 3.78 3.61   Hgb 10.1* 9.3*   Hematocrit 32.3* 28.7*   Platelets 368* 303        Recent Labs   Lab 11/14/19  0515 11/11/19  0523   Sodium 143 143   Potassium 4.3 4.1   Chloride 109 108   CO2 24 24   BUN 20* 17   Creatinine 0.8 0.7   Calcium 9.8 10.0   Glucose 91 74             Results     Procedure Component Value Units Date/Time    Basic Metabolic Panel [[id number]]  (Abnormal) Collected: 11/14/19 0515    Specimen: Blood Updated: 11/14/19 0705     Glucose 91 mg/dL      BUN 20 mg/dL      Creatinine 0.8 mg/dL      Calcium 9.8 mg/dL      Sodium 629 mEq/L      Potassium 4.3 mEq/L      Chloride 109 mEq/L      CO2 24 mEq/L      Anion Gap 10.0    GFR [[id number]] Collected: 11/14/19 0515     Updated: 11/14/19 0705     EGFR >60.0    CBC without differential [[id number]]  (Abnormal) Collected: 11/14/19 0515    Specimen: Blood Updated: 11/14/19 0640     WBC 3.78 x10 3/uL      Hgb 10.1 g/dL      Hematocrit 64.4 %  Platelets 368 x10 3/uL      RBC 3.33 x10 6/uL      MCV 97.0 fL      MCH 30.3 pg      MCHC 31.3 g/dL      RDW 15 %       MPV 9.2 fL      Nucleated RBC 0.0 /100 WBC      Absolute NRBC 0.00 x10 3/uL              Rads:   Radiological Procedure reviewed.  Radiology Results (24 Hour)     ** No results found for the last 24 hours. **          Assessment/Plan:     Continue PT, OT in acute rehabilitation medical setting.  Continue comprehensive and intensive inpatient rehab program, including: Physical therapy 60-120 min daily, 5-6 times per week, Occupational therapy 60-120 min daily, 5-6 times per week, Case management and Rehabilitation nursing for the following:    Peripheral autoimmune demyelinating disease.   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 Overall; clinically a monophasic demyelinating disease  Outpatient Neurology Appointment scheduled at [facility] with Dr. 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, GERD, esophageal stricture, IBS): Diarrhea now resolved  3/23 [place]'d Marinol  PO low fiber diet-->upgraded to consistent carb 3/23 per patient request  Miralax PRN for constipation    #Hypercholesterolemia: Zetia    #Pain Management (Hx lumbar radiculopathy, fibromyalgia): Tylenol, Cymbalta, and Lidoderm patches    #Anemia: Monitor CBC  Continue iron and Vitamin C supplement    #DMII: Hypoglycemic on admission, SSI coverage only for  now  Medicine consulted to follow    #Hypothyroidism: Synthroid    #DVT Prophylaxis: Use Lovenox    #Bowel/Bladder Management: PVRs to rule out urinary retention. Use Senna and Colace    #Skin Care: Keep skin clean and dry    #Psych: Psychology services for evaluation and treatment as needed for adjustment/coping     #FEN/GI: Upgraded from low fiber to consistent carb diet. Patient had poor appetite on low fiber diet. Nutrition consult  Protonix for GI prophylaxis    #Dispo:  to home 11/16/19  Team conference was held 11/12/19. Patient' status and progress  discussed at length today with the interdisciplinary team during team conference ;report with detailed status, progress, and goals will be placed in Medilinks     Face to face discussion with the patient and coordination of care  took more than  35 minutes; more than 50% of time was spent on coordination of care with interdisciplinary team including physical therapist, occupational therapist, SLP, recreational therapist ,rehabilitation nursing, dietician , neuropsychologist ,and case manager     Medication Review  A complete drug regimen review was completed: Yes  A complete drug regimen review was completed and no drug issues were found.  Discussed plan with patient. All questions were answered.  Signed by: Cammy Copa DO    [facility]

## 2019-11-14 NOTE — Progress Notes (Signed)
MEDICINE PROGRESS NOTE  [facility], DIVISION OF HOSPITALIST MEDICINE   [place] [facility]   [place] Pager: [phone]      Date Time: 11/14/19 4:26 PM  Patient Name: Christy Mcintosh  Attending Physician[facility] Day[facility] 9  Assessment:     Active Hospital Problems    Diagnosis    Peripheral autoimmune demyelinating disease       58 year oldright hand dominantfemalewith PMHx lumbar radiculopathy, anemia, DMII, seizure disorder, hx craniotomy for meningioma resection on Vimpat, hypothyroidism, who was in her normal state of health until [facility]. She was taken to the OR on 09/12/19, and is now s/p total abdominal colectomy with ileorectal anastamosis. 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  She was transferred to [facility] on 10/11/2019; treatment appears to have included 5 plasma exchange treatment with significant improvement.  Patient transferred to acute rehab on 3/17.    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  -Monitor tolerance to current diet;  Continue Peri-Colace as needed , patient currently denies constipation  -Continue as needed simethicone     History of DM type 2--HbA1C 5 0.0 on 3/18  -BG have been well controlled with diet alone    HTN   BP currently well controlled  Continue losartan    Hyperlipidemia  Continue Crestor  and Zetia    Hypothyroidism  Continue Synthroid    Normocytic anemia--suspect component of iron deficiency  -Stared  iron supplement with TSAT 11% on 11/08/2019,   Ferritin noted to be within normal limits at 32  -Noted normal B12 and folate levels on 3/19    Hx of narcolepsy?  -cont Adderall     Anxiety  -cont Cymbalta, also on evening xanax    Deep vein thrombosis prophylaxis with Lovenox     Case discussed with:Pt, staff including nursing.   Will see PRN     Safety Checklist:     DVT prophylaxis:  CHEST guideline (See page [id number]) Chemical   Foley:  [place] [facility] Foley protocol Not present   IVs:  Peripheral IV   PT/OT: Ordered       Subjective     CC: Peripheral autoimmune demyelinating disease  Interval History/24 hour events: no events  Supervise For Meals Frequency: All meals  ENSURE COMPACT SUPPLEMENT Quantity: A. One; Frequency: BID (2 times a day) - with Breakfast and Dinner  Diet consistent carbohydrate Additional restrictions: LOW LACTOSE  HPI/Subjective: Patient seen and examined.  No events overnight.  Denies any chest pain or shortness of breath.  No nausea or vomiting.  No fevers.  She states that she has been doing well in physical therapy and continues have some left lower extremity weakness.    Review of Systems:   Review of Systems - Negative except as above in HPI    Physical Exam:     Temp:  [97.7 F (36.5 C)-98.4 F (36.9 C)] 98.1 F (36.7 C)  Heart Rate:  [79-94] 86  Resp Rate:  [16] 16  BP: (108-115)/(71-78) 108/77  Intake and Output Summary-last 24 Hrs:  I/O last 3 completed shifts:  In: 1560 [P.O.:1560]  Out: 3 [Urine:3]    No data recorded by O2 Device: None (Room air)     General: awake, alert ,in no acute distress  Cardiovascular: regular rate and rhythm, no murmurs  Lungs: clear to auscultation bilaterally, no additional sounds  Abdomen: soft, non-tender, non-distended; normoactive bowel sounds  Extremities: no edema  Neurological: Alert and oriented X 3, moves all extremities, 4/5  strength in dorsi and plantarflexion LLE        Meds:   Medications were reviewed:  Scheduled Meds:  Current Facility-Administered Medications   Medication Dose Route Frequency    acetaminophen  1,000 mg Oral TID    ALPRAZolam  2 mg Oral QHS    amphetamine-dextroamphetamine  20 mg Oral BID    carboxymethylcellulose (PF)  1 drop Left Eye BID    DULoxetine  60 mg Oral Daily    enoxaparin  40 mg Subcutaneous Daily    ezetimibe  10 mg Oral QHS    ferrous sulfate  324 mg Oral QAM W/BREAKFAST    gabapentin  300 mg Oral Q8H SCH    lacosamide  150 mg Oral BID    levothyroxine  50 mcg Oral Daily at 0600    lidocaine  1 patch Transdermal Q24H    losartan  25 mg Oral Daily    pantoprazole  40 mg Oral Daily at 0600    rosuvastatin  40 mg Oral QHS    thiamine  100 mg Oral Daily    vitamin D (cholecalciferol)  25 mcg Oral Daily    zonisamide  200 mg Oral BID     Continuous Infusions:  PRN Meds:.acetaminophen, Nursing communication: Adult Hypoglycemia Treatment Algorithm **AND** dextrose **AND** dextrose **AND** glucagon (rDNA), methocarbamol, oxyCODONE, polyethylene glycol, senna-docusate, simethicone  Labs/Radiology:   Imaging personally reviewed, including: all available   No results found.  No results for input(s): GLUCOSEWB in the last 24 hours.      Recent Labs   Lab 11/14/19  0515 11/11/19  0523   Sodium 143 143   Potassium 4.3 4.1   Chloride 109 108   BUN 20* 17   Creatinine 0.8 0.7   EGFR >60.0 >60.0   Glucose 91 74   Calcium 9.8 10.0     Recent Labs   Lab 11/14/19  0515 11/11/19  0523   WBC 3.78 3.61   Hgb 10.1* 9.3*   Hematocrit 32.3* 28.7*   Platelets 368* 303                     Signed by: Arna Medici, MD

## 2019-11-14 NOTE — Rehab Progress Note (Medilinks) (Signed)
Christy Mcintosh  MRN: [id number]  Account: [id number]  Session Start: 11/14/2019 12:00:00 AM  Session Stop: 11/14/2019 12:00:00 AM    Total Treatment Minutes:  Minutes    Rehabilitation Nursing  Inpatient Rehabilitation Shift Assessment    Rehab Diagnosis: Monophasic Demyelinating Myeloencephalitis  Demographics:            Age: 44Y            Gender: Female  Primary Language: English    Date of Onset:  09/12/19  Date of Admission: 11/06/2019 5:56:00 PM    Rehabilitation Precautions Restrictions:   Fall Precaution and Skin Precaution    Patient Report: I'm ok.  Patient/Caregiver Goals:  "I want to get stronger and go back home"    Wounds/Incisions: No wounds or incisions.    Medication Review: No clinically significant medication issues identified this  shift.    Bowel and Bladder Output:                       Bladder (# only)             Bowel (# only)  Number of Episodes  Continent            2                            1  Incontinent          0                            0    Additional Education Provided:    Education Provided: Safety issues and interventions. Fall protocol. Supervision  requirements.       Audience: Patient.       Mode: Explanation.       Response: Verbalized understanding.    TEAM CARE PLAN  Identified problems from team documentation:  Problem: Impaired Cognition    Problem: Impaired Mobility  Mobility: Primary Team Goal: Pt will perform all mobility with ModI with LRAD  for safe D/C home./Active    Problem: Impaired Pain Management  Pain Mgmt: Primary Team Goal: Patient will verbalize pain less than or equal to  2 out of 10 with the use of medication and positioning techniques/    Problem: Impaired Psychosocial Skills/Behavior  PsychoSocial: Primary Team Goal: Maintain healthy adjustment to the  rehabilitation process and decrease stress related to functional changes./    Problem: Impaired Self-care Mgmt/ADL/IADL  Self Care: Primary Team Goal: Pt will complete ADL routine and  household  transfers with mod I/Active    Problem: Impaired Skin/Wound Mgmt  Skin Wound: Primary Team Goal: Patient  will independently perform weight shifts  to prevent skin breakdown/    Problem: Safety Risk and Restraint  Safety: Primary Team Goal: Patient will follow safety precautions and call for  assistance 100% of the time during hospitalization/    Please review Integrated Patient View Care Plan Flowsheet for Team identified  Problems, Interventions, and Goals.    Signed by: Sherlyn Lick, RN 11/14/2019 6:30:00 PM

## 2019-11-14 NOTE — Rehab Progress Note (Medilinks) (Signed)
NAMEPATRECE TALLIE  MRN: [id number]  Account: [id number]  Session Start: 11/14/2019 8:00:00 AM  Session Stop: 11/14/2019 9:00:00 AM    Total Treatment Minutes: 60.00 Minutes    Speech Language Pathology  Inpatient Rehabilitation Treatment Note    Rehab Diagnosis: Monophasic Demyelinating Myeloencephalitis  Demographics:            Age: 42Y            Gender: Female  Rehabilitation Precautions/Restrictions:   Fall Precaution and Skin Precaution    Interventions:       Speech Treatment: SLP intervention addressed executive functions in context  of medication management.       Speech Treatment: Pt re-iterated goal to educate herself regarding current  medications and manage with least amt of assistance. Downloaded MediSafe app on  smart phone in order to organize current medications and set up reminder system.  Pt recalled majority of medication names and purpose, some assistance to  identify dosage and frequency. SLP provided minimal assistance to aid pt  identify primary med information from medical chart and transfer to app. Pt  demonstrated increased familiarity and ease with app management by end of task,  improving to supervision level assist to set up reminders.     Bathroom, notified RN Handoff to nurse completed.  Oriented to call bell and placed within reach.  Personal items within reach.    ASSESSMENT  Less assistance required in order to initiate, process and sequence 2-3 step  tasks as compared to initial evaluation. Pt continues to show benefit from  incorporating cognitive strategies in functional context.    3 Hour Rule Minutes: 60 minutes of SLP treatment this session count towards  intensity and duration of therapy requirement. Patient was seen for the full  scheduled time of SLP treatment this session.  Therapy Mode Minutes: Individual: 60 minutes.    Signed by: [name], M.A., CCC/SLP 11/14/2019 9:00:00 AM

## 2019-11-14 NOTE — Rehab Discharge Instruction (Medilinks) (Signed)
Christy Mcintosh  MRN: [id number]  Account: [id number]  Session Start: 11/14/2019 12:00:00 AM  Session Stop: 11/14/2019 12:00:00 AM    Total Treatment Minutes:  Minutes    Case Management  Inpatient Rehabilitation Discharge Instructions    Discharge Date: Saturday, November 16, 2019 to Home ([place])  Transportation: Spouse will transport patient and himself via private vehicle to  [employer] for a scheduled flight back to their home in [place]  Discharge Plan: Patient was discharged with a Out-Pt prescription for PT/OT    Follow-up Appointment(s):    Virtual appointment scheduled for Monday, April 5 at 2:00 pm    [name]  Colorectal Surgery  [facility]    Virtual appointment scheduled for Tuesday, April 6    [name] [facility]  Neurology  [facility]      Renotification of Medicare Important Message: The Renotification of Medicare  Important Message letter was issued.    Community Resources: None    Additional Information: CM discussed patients discharge plan with patient and  she verbalized understanding and agreement with the plan. For questions please  call Christy Mcintosh, CM [phone].    Signed by: Christy Mcintosh, MSW 11/14/2019 4:23:00 PM

## 2019-11-14 NOTE — Rehab Progress Note ([facility]) (Signed)
Christy Mcintosh  MRN: [id number]  Account: [id number]  Session Start: 11/14/2019 11:00:00 AM  Session Stop: 11/14/2019 12:00:00 PM    Total Treatment Minutes: 60.00 Minutes    Occupational Therapy  Inpatient Rehabilitation Treatment Note    Rehab Diagnosis: Monophasic Demyelinating Myeloencephalitis  Demographics:            Age: 44Y            Gender: Female  Rehabilitation Precautions/Restrictions:   Fall Precaution and Skin Precaution    Interventions:       Self Care/Home Management:  Pt received semi-supine in bed with MD present  at start of session. Engaged in collaborative discussion regarding discharge  plan, as pt attempted to coordinate discharge for tomorrow vs. recommended  discharge date. Educated pt on realistic expectations, necessary paperwork and  coordination involved in discharge, and recommended alternative. Pt resistant  overall, however treatment team reinforced information. Pt ambulated with SPC  within room and >511ft to retrieve clothes and to access laundry machines with  close supervision and cues to reduce distractions, as pt noted to have increased  staggering when simultaneously engaging in conversation. Completed laundry as  well as washed dishes in standing with supervision while relying on  countertop/work surface for support. Educated on compensatory strategies and pt  completed mass repetitions of lateral stepping along counter to place dishes  into drying rack. Pt able to retrieve items from floor and out of low cabinets  with CGA for safety and 0 LOB.  Patient was returned to or left in bed. Handoff to nurse completed.  Oriented to call bell and placed within reach.  Personal items within reach.  Assistive devices positioned out of reach.    ASSESSMENT  Pt with fluctuating levels of insight into extent of deficits, sometimes  inflating impairments and other times underestimating impairments. Pt will  benefit from ongoing insight building opportunities while engaged in IADLs  in  preparation for discharge home. Will continue to collaborate with treatment team  to form appropriate discharge plan. Continue OT POC.    3 Hour Rule Minutes: 60 minutes of OT treatment this session count towards  intensity and duration of therapy requirement. Patient was seen for the full  scheduled time of OT treatment this session.  Therapy Mode Minutes: Individual: 60 minutes.    Signed by: [name], OTR/L 11/14/2019 12:00:00 PM

## 2019-11-14 NOTE — Progress Notes (Signed)
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    Details of work-up done at [facility] are added on Dr. Elly Modena H&Pin blue  text      Continue antiepileptic regimen  Agree with stopping vitamin D vitamin E supplementation and B12 supplementation Given levels have normalized B1 levels are still pending   Given significant enhancement noted on brain MRI, I think we will need to repeat imaging study, at some point to make sure that it is improving(given patient is improving, does not necessarily have to be done during this hospital stay) preference would be to be done as outpatient so this can be followed up with her outpatient treating neurology team      Subjective:   Patient Seen and Examined.  Feels well, no new symptoms no worsening  Review of Systems:   No headache, eye, ear nose, throat problems; no coughing or wheezing or shortness of breath, No chest pain or orthopnea, no abdominal pain, nausea or vomiting, No pain in the body or extremities, no psychiatric, neurological, endocrine, hematological or cardiac complaints except as noted above.     Physical Exam:   BP 112/78   Pulse 94   Temp 98.4 F (36.9 C) (Oral)   Resp 16   Ht 1.6 m (5\' 3" )   Wt 63.6 kg (140 lb 3.4 oz)   SpO2 100%   BMI 24.84 kg/m   *Awake alert oriented x3 fluent smile symmetric eye movements intact pupils equal reactive no drift moving both arms  and legs well no tremor finger-nose performed quite well excellent strength noted  Meds:      Scheduled Meds: PRN Meds:    acetaminophen, 1,000 mg, Oral, TID  ALPRAZolam, 2 mg, Oral, QHS  amphetamine-dextroamphetamine, 20 mg, Oral, BID  carboxymethylcellulose (PF), 1 drop, Left Eye, BID  DULoxetine, 60 mg, Oral, Daily  enoxaparin, 40 mg, Subcutaneous, Daily  ezetimibe, 10 mg, Oral, QHS  ferrous sulfate, 324 mg, Oral, QAM W/BREAKFAST  gabapentin, 300 mg, Oral, Q8H SCH  lacosamide, 150 mg, Oral, BID  levothyroxine, 50 mcg, Oral, Daily at 0600  lidocaine, 1 patch, Transdermal, Q24H  losartan, 25 mg, Oral, Daily  pantoprazole, 40 mg, Oral, Daily at 0600  rosuvastatin, 40 mg, Oral, QHS  thiamine, 100 mg, Oral, Daily  vitamin D (cholecalciferol), 25 mcg, Oral, Daily  zonisamide, 200 mg, Oral, BID        Continuous Infusions:   acetaminophen, 650 mg, Q6H PRN  dextrose, 15 g of glucose, PRN   And  dextrose, 12.5 g, PRN   And  glucagon (rDNA), 1 mg, PRN  methocarbamol, 750 mg, TID PRN  oxyCODONE, 5 mg, Q6H PRN  polyethylene glycol, 17 g, Daily PRN  senna-docusate, 2 tablet, BID PRN  simethicone, 80 mg, Q6H PRN                Labs:  Recent Labs   Lab 11/14/19  0515 11/11/19  0523   Glucose 91 74   BUN 20* 17   Creatinine 0.8 0.7   Calcium 9.8 10.0   Sodium 143 143   Potassium 4.3 4.1   Chloride 109 108   CO2 24 24     Recent Labs   Lab 11/14/19  0515 11/11/19  0523   WBC 3.78 3.61   Hgb 10.1* 9.3*   Hematocrit 32.3* 28.7*   MCV 97.0* 96.6*   MCH 30.3 31.3   MCHC 31.3* 32.4   Platelets 368* 303         No results for input(s): PTT, PT, INR in the last 72 hours.       Radiology Results (24 Hour)     ** No results found for the last 24 hours. **           All brain imaging (MRI, CT) personally reviewed.    Case discussed with:pt Dr. Sharlet Salina      This note was generated by the Epic EMR system/ Dragon speech recognition and may contain inherent errors or omissions not intended by the user. Grammatical errors, random word  insertions, deletions and pronoun errors  are occasional consequences of this technology due to software limitations.     Not all errors are caught or corrected. If there are questions or concerns about the content of this note or information contained within the body of this dictation they should be addressed directly with the author for clarification.      Signed by: Cathe Mons, MD  Spectralink: [id number]      Answering Service: [phone]

## 2019-11-15 NOTE — Rehab Progress Note ([facility]) (Signed)
NAMESELAMAWIT Mcintosh  MRN: [id number]  Account: [id number]  Session Start: 11/15/2019 12:00:00 AM  Session Stop: 11/15/2019 12:00:00 AM    Total Treatment Minutes:  Minutes    Rehabilitation Nursing  Inpatient Rehabilitation Shift Assessment    Rehab Diagnosis: Monophasic Demyelinating Myeloencephalitis  Demographics:            Age: 66Y            Gender: Female  Primary Language: English    Date of Onset:  09/12/19  Date of Admission: 11/06/2019 5:56:00 PM    Rehabilitation Precautions Restrictions:   Fall Precaution and Skin Precaution    Patient Report: " i'm fine"  Patient/Caregiver Goals:  "I want to get stronger and go back home"    Wounds/Incisions: No wounds or incisions.    Medication Review: No clinically significant medication issues identified this  shift.    Bowel and Bladder Output:                       Bladder (# only)             Bowel (# only)  Number of Episodes  Continent            3                            0  Incontinent          0                            0    Additional Education Provided:    Education Provided: Precautions. Pain management. Pain scale. Medication  options. Side effects. Clinical indicators of pain. Safety issues and  interventions. Supervision requirements. Use of adaptive devices. Fall  prevention/balance training. Safety. Medication. Name and dosage.  Administration. Purpose. Side Effects. Interaction. Labs. Administration of  Lovenox injections. Cardiac. Monitoring heart rate. Monitoring blood pressure.  Diabetes. Medication. Glucose testing. Insulin drawing/mixing/administration.  Diet management. Hypoglycemia causes/symptoms/treatment. Hyperglycemia  causes/symptoms/treatment.       Audience: Patient.       Mode: Explanation.       Response: Verbalized understanding.    TEAM CARE PLAN  Identified problems from team documentation:  Problem: Impaired Cognition    Problem: Impaired Mobility  Mobility: Primary Team Goal: Pt will perform all mobility with ModI with  LRAD  for safe D/C home./Active    Problem: Impaired Pain Management  Pain Mgmt: Primary Team Goal: Patient will verbalize pain less than or equal to  2 out of 10 with the use of medication and positioning techniques/    Problem: Impaired Psychosocial Skills/Behavior  PsychoSocial: Primary Team Goal: Maintain healthy adjustment to the  rehabilitation process and decrease stress related to functional changes./    Problem: Impaired Self-care Mgmt/ADL/IADL  Self Care: Primary Team Goal: Pt will complete ADL routine and household  transfers with mod I/Active    Problem: Impaired Skin/Wound Mgmt  Skin Wound: Primary Team Goal: Patient  will independently perform weight shifts  to prevent skin breakdown/    Problem: Safety Risk and Restraint  Safety: Primary Team Goal: Patient will follow safety precautions and call for  assistance 100% of the time during hospitalization/    Please review Integrated Patient View Care Plan Flowsheet for Team identified  Problems, Interventions, and Goals.    Signed by: Emelia Loron, RN 11/15/2019 3:00:00 AM

## 2019-11-15 NOTE — Rehab Discharge Summary ([facility]) (Signed)
Christy Mcintosh  MRN: [id number]  Account: [id number]  Session Start: 11/15/2019 7:00:00 AM  Session Stop: 11/15/2019 8:00:00 AM    Total Treatment Minutes: 60.00 Minutes    Occupational Therapy  Inpatient Rehabilitation Discharge Summary and Note    Rehab Diagnosis: Monophasic Demyelinating Myeloencephalitis  Demographics:            Age: 59Y            Gender: Female    Past Medical History: Interstitial Cystitis  DM  HTN  Hypothyroidism  Asthma  GERD/IBS  Seizures D/O  Fibromyalgia  DDD Lumbar Spine  Overactive Bladder  Chronic Constipation  GI bleed  Esophageal Stricture    PSH:  Appendectomy  Bladder sling  Hysterectomy  BS and ovarian removal  Craniotomy secondary to meningioma  Breast Bx's  Bilateral knee meniscus surgeries  Cataract Surgeries  Rhinoplasty  Rotator cuff surgery  History of Present Illness: Christy Mcintosh is a 59 year old female originally  admitted to [facility] in January 2021 with colonic  inertia.  She was taken to the OR on 09/12/19 for Colonic Inertia, S/P Total  abdominal Colectomy with ileorectal anastamosis. Post op complicated by ilieus  and was transferred to [facility] on 10/11/19 for further treatment of post op needs  and new dx of subacute progressive myelopathy requiring Neurology consult.  The  patient was also receiving TPN, now discontinued and the patient is tolerating  diet well with improved intake. On Marinol-appetite stimulant. Regular diet with  Ensure BID.+BM.  Regarding monophasic Autoimmune Demyelinating  Myeloencephalitis-presented with subacute progressive myelopathy on 10/03/19  with BLE paraparesis and hyporeflexia.  Imaging significant for transverse  myelitis with patchy, non diffusing T2 abnormalities involving the corticospinal  tracts.  Completed 5/5 PLEX started on 09/25/19 with improvement noted.  EMG  completed on 10/14/19 without s/o of neuropathy.  PM+R consult completed as well  as neurology consult.  The patient was also followed for  the following acute  medical conditions: UTI/Cystitis- Started on Nitrofurontinin SR 100 mg BID  Started on March 10th, myeloradicular lumbar back pain- continues on Tylenol,  Cymbalta, Robaxin, and Lidoderm patches, Anemia-Continue Iron and monitor H+H  6.  MRSA Precautions-Resolved, Testing negative from 10/23/19, pt removed from  serial precautions. Hx of DM, Continue Insulin as ordered. Seizure Disorder, Hx  of Craniotomy for Meningioma resection.  Continues on PO Vimpat.   Hypothyroidism-Continue on PO Synthroid.  The patient is alert and oriented x4,  pleasant and cooperative with care needs. Tolerating regular diet well, + BM.  Paraparesis improving and the patient is working well with therapy services with  progress made.  The patient has been working well with therapy services to  include PT and OT to address functional mobility, self care deficit, and pain.  The patient would benefit from an intensive level of rehab along with close  medical management of multiple medical conditions and rehab MD to drive the  rehab process.  The patient is medically stable for IRF level of care.   Date of Onset: 09/12/19   Date of Admission: 11/06/2019 5:56:00 PM  Rehabilitation Precautions/Restrictions:   Fall Precaution and Skin Precaution    SUBJECTIVE  Patient Report: "I get an A today!"  Pain: Patient currently without complaints of pain.    OBJECTIVE  Cognition: Pt presents with mild memory difficulties and occasional difficulty  with appropriate insight into extent of deficits/safety. However, does not  significantly impact function.  Perception: Pt reports intermittent numbness/impaired  sensation in LEs    CARE Tool Discharge Performance  Self-Care Functional Assessment  Eating: 06 - Independent without an assistive device    Oral Hygiene: 06 - Independent without an assistive device    Toileting Hygiene: 06 - Independent with an assistive device  Toileting Equipment:   Toilet  Assistive Device(s):   Adaptive device  to maintain balance    Shower/Bathe Self: 05 - Setup or Clean Up Assistance: Helper sets up or cleans  up prior to or following an activity  Location:  Shower.  Assistive Device(s):   Grab bar/arm rest to maintain balance, Hand held shower,  Shower chair    Upper Body Dressing:   06 - Independent with an assistive device  Assistive Device(s):   Assistive device for item retrieval    Lower Body Dressing: 06 - Independent with an assistive device  Assistive Device(s):   Assistive device for item retrieval/balance    Putting On/Taking Off Footwear: 06 - Independent without an assistive device    Mobility Functional Assessment  Toilet Transfer: 06 - Independent with an assistive device  Assistive Device(s):   Rolling walker    Picking Up Objects: 06 - Independent with an assistive device  Assistive Device(s:)   Assistive device to maintain balance    Today's Treatment Interventions:       Self Care/Home Management:  Pt received supine in bed, alert, and agreeable  to OT tx. Pt completed bed mobility and ambulated within room to retrieve  items/access bathroom with mod I and rollator for balance. Pt completed full  morning ADL routine (see CARE tool for performance details.) 1 VC required for  appropriate use of brakes on rollator 2/2 novelty of AD. Pt engaged in  discussion regarding discharge recommendations and status with pt agreeable and  verbalizing understanding.  Patient was left seated in chair in his/her room. Handoff to nurse completed.  Oriented to call bell and placed within reach.  Personal items within reach.    Home Exercise Program: The patient was provided with an individualized home  exercise program.  Equipment Provided/Ordered: Pt with all necessary DME/AE at home at this time,  therefore no additional equipment recommendations. Pt will benefit from  supervision for IADLs such as cooking and laundry to ensure safety upon  discharge.    Education Provided:    Education ProvidedEngineer, mining. Discharge  instructions .       Audience: Patient.       Mode: Explanation.       Response: Applied knowledge.  Verbalized understanding.  Demonstrated skill.    ASSESSMENT  Summary of Progress and Current Status: Pt made excellent progress over course  of OT tx. Pt initially required steadying assist with difficulty ambulating,  benefiting from use of w/c for mobility. However, pt participated well during OT  tx and has achieved mod I level with use of rollator for balance. Pt presents  with lingering instability and strength deficits, benefiting from supervision  for IADLs upon discharge. Pt has all necessary equipment in place at home and  husband unable to attend training, however will be present for assistance at  home upon discharge. Pt will benefit from transition to outpatient services to  address remaining deficits and promote return to PLOF.    Long Term Goals (status prior to discharge): Pt will complete ADL routine with  mod I and DME/AE PRN  Pt will complete household transfers with mod I and LRAD  Pt will complete simple cleaning task with  mod I and LRAD in order to return to  meaningful roles at home  Pt's husband will attend caregiver training and demonstrate independence with  providing necessary assist for safe discharge home.  7-10 days from IE (11/07/19)  Short Term Goals (status prior to discharge):    Progress Towards Goals (final status):   LONG TERM GOAL REVIEW:       1. Pt will complete ADL routine with mod I and DME/AE PRN - Met       2. Pt will complete household transfers with mod I and LRAD - Met        3. Pt will complete simple cleaning task with mod I and LRAD in order to  return to meaningful roles at home - Met       4. Pt's husband will attend caregiver training and demonstrate independence  with providing necessary assist for safe discharge home. - Not Met Unable to  attend training    PLAN  Occupational Therapy Plan: Patient is recommended for outpatient therapy  services.    Team Care  Plan  Please review Integrated Patient View Care Plan Flowsheet for Team identified  Problems, Interventions, and Goals.    Identified problems from team documentation:  Problem: Impaired Cognition    Problem: Impaired Mobility  Mobility: Primary Team Goal: Pt will perform all mobility with ModI with LRAD  for safe D/C home./Active    Problem: Impaired Pain Management  Pain Mgmt: Primary Team Goal: Patient will verbalize pain less than or equal to  2 out of 10 with the use of medication and positioning techniques/    Problem: Impaired Psychosocial Skills/Behavior  PsychoSocial: Primary Team Goal: Maintain healthy adjustment to the  rehabilitation process and decrease stress related to functional changes./    Problem: Impaired Self-care Mgmt/ADL/IADL  Self Care: Primary Team Goal: Pt will complete ADL routine and household  transfers with mod I/Active    Problem: Impaired Skin/Wound Mgmt  Skin Wound: Primary Team Goal: Patient  will independently perform weight shifts  to prevent skin breakdown/    Problem: Safety Risk and Restraint  Safety: Primary Team Goal: Patient will follow safety precautions and call for  assistance 100% of the time during hospitalization/    Status update for discharge:     Self Care Management:   Primary Goal: Met  Discharge Status Comment:   Pt is mod I with all ADLs and household transfers  with use of DME/AD    3 Hour Rule Minutes: 60 minutes of OT treatment this session count towards  intensity and duration of therapy requirement. Patient was seen for the full  scheduled time of OT treatment this session.  Therapy Mode Minutes: Individual: 60 minutes.    Signed by: Christy Mcintosh, OTR/L 11/15/2019 8:00:00 AM

## 2019-11-15 NOTE — Rehab Discharge Summary (Medilinks) (Signed)
Christy Mcintosh  MRN: [id number]  Account: [id number]  Session Start: 11/15/2019 10:00:00 AM  Session Stop: 11/15/2019 11:00:00 AM    Total Treatment Minutes: 60.00 Minutes    Speech Language Pathology  Inpatient Rehabilitation  Language Cognitive and Dysphagia Discharge Summary and Note    Rehab Diagnosis: Monophasic Demyelinating Myeloencephalitis  Demographics:            Age: 38Y            Gender: Female  Primary Language: [place]    Past Medical History: Interstitial Cystitis  DM  HTN  Hypothyroidism  Asthma  GERD/IBS  Seizures D/O  Fibromyalgia  DDD Lumbar Spine  Overactive Bladder  Chronic Constipation  GI bleed  Esophageal Stricture    PSH:  Appendectomy  Bladder sling  Hysterectomy  BS and ovarian removal  Craniotomy secondary to meningioma  Breast Bx's  Bilateral knee meniscus surgeries  Cataract Surgeries  Rhinoplasty  Rotator cuff surgery  History of Present Illness: Christy Mcintosh is a 59 year old female originally  admitted to [facility] in January 2021 with colonic  inertia.  She was taken to the OR on 09/12/19 for Colonic Inertia, S/P Total  abdominal Colectomy with ileorectal anastamosis. Post op complicated by ilieus  and was transferred to [facility] on 10/11/19 for further treatment of post op needs  and new dx of subacute progressive myelopathy requiring Neurology consult.  The  patient was also receiving TPN, now discontinued and the patient is tolerating  diet well with improved intake. On Marinol-appetite stimulant. Regular diet with  Ensure BID.+BM.  Regarding monophasic Autoimmune Demyelinating  Myeloencephalitis-presented with subacute progressive myelopathy on 10/03/19  with BLE paraparesis and hyporeflexia.  Imaging significant for transverse  myelitis with patchy, non diffusing T2 abnormalities involving the corticospinal  tracts.  Completed 5/5 PLEX started on 09/25/19 with improvement noted.  EMG  completed on 10/14/19 without s/o of neuropathy.  PM+R consult  completed as well  as neurology consult.  The patient was also followed for the following acute  medical conditions: UTI/Cystitis- Started on Nitrofurontinin SR 100 mg BID  Started on March 10th, myeloradicular lumbar back pain- continues on Tylenol,  Cymbalta, Robaxin, and Lidoderm patches, Anemia-Continue Iron and monitor H+H  6.  MRSA Precautions-Resolved, Testing negative from 10/23/19, pt removed from  serial precautions. Hx of DM, Continue Insulin as ordered. Seizure Disorder, Hx  of Craniotomy for Meningioma resection.  Continues on PO Vimpat.   Hypothyroidism-Continue on PO Synthroid.  The patient is alert and oriented x4,  pleasant and cooperative with care needs. Tolerating regular diet well, + BM.  Paraparesis improving and the patient is working well with therapy services with  progress made.  The patient has been working well with therapy services to  include PT and OT to address functional mobility, self care deficit, and pain.  The patient would benefit from an intensive level of rehab along with close  medical management of multiple medical conditions and rehab MD to drive the  rehab process.  The patient is medically stable for IRF level of care.   Date of Onset: 09/12/19   Date of Admission: 11/06/2019 5:56:00 PM  Premorbid Functional Level: Per interview, independent w/ ADLs and IADLs which  was also verified in medical charting. Of note, endorses some premorbid memory  difficulty before hospital admission.  Social/Educational History: Retired.  Home Environment: The patient lives with her spouse in a 1 level home with 1 STE  in [place], [place].  Rehabilitation Precautions/Restrictions:   Fall Precaution and Skin Precaution    SUBJECTIVE  Patient Report: "I'm really happy to be going home tomorrow."  Pain: Patient currently without complaints of pain.      OBJECTIVE    Current Diet: Regular    Today's Treatment Interventions:       Speech Treatment: SLP intervention addressed cognitive  compensatory  strategies, focusing on teach/back opportunities in order to maximize carryover  prior to d/c home tomorrow.       Speech Treatment: Pt and SLP reviewed compensatory strategies and areas  addressed in ST, including pacing scale, time management and organization  strategies. SLP provided patient with typed summary of attention/processing  strategies in order to include in HEP. Pt highlighted bullet points that  pertained to her specific rehab plan without additional prompting. While  incorporating learned organization tools (ie google calendar, med notifications  on phone, daily schedule), pt was able to demonstrate adequate use and function  of 100% sample tasks  provided incidental cueing from therapist.    Patient was returned to or left in bed. Handoff to nurse completed.  Oriented to call bell and placed within reach.  Personal items within reach.    Home Exercise Program: The patient was provided with an individualized home  exercise program.  Education Provided:    Education Provided: Compensatory cognitive strategies/aids.       Audience: Patient.       Mode: Explanation.  Teacher, English as a foreign language provided.       Response: Verbalized understanding.    ASSESSMENT  Summary of Progress and Current Status: Skilled SLP intervention targeted  cognitive re-training and compensatory strategy training in order to maximize  independence in household tasks. Patient demonstrated steady progression through  cognitive goals, improving from overall moderate assistance to manage 2-3 step  tasks and process incoming information, to min assist for many simulated  iadls.SLP introduced several organization based strategies including cognitive  pacing scale with visual aid, smart phone based apps to manage appts (google  calendar), meds (medisafe), and daily scheduling. Patient will be returning home  in [place], [place] with spouse who will be providing supervision - assistance, as  needed,  in home setting.    Progress Toward  Goals (final status):     LONG TERM GOAL REVIEW:       1. Pt will utilize compensatory cognitive strategies in order to aide new  learning of medical and rehab education given only min cues from  therapist/caregiver. - Met       2. Pt will successfully adopt strategies (e.g., re-reading, underlining,  re-checking) in order to better process, organize, and plan information in more  complex contexts (med management, financial affairs, appointment scheduling)  given only min cues from therapist/caregiver. - Met    PLAN  Speech Pathology Plan: Patient is recommended for home health therapy services.    Team Care Plan  Please review Integrated Patient View Care Plan Flowsheet for Team identified  Problems, Interventions, and Goals.    Identified problems from team documentation:  Problem: Impaired Cognition    Problem: Impaired Mobility  Mobility: Primary Team Goal: Pt will perform all mobility with ModI with LRAD  for safe D/C home./Active    Problem: Impaired Pain Management  Pain Mgmt: Primary Team Goal: Patient will verbalize pain less than or equal to  2 out of 10 with the use of medication and positioning techniques/    Problem: Impaired Psychosocial Skills/Behavior  PsychoSocial: Primary Team Goal: Maintain healthy  adjustment to the  rehabilitation process and decrease stress related to functional changes./    Problem: Impaired Self-care Mgmt/ADL/IADL  Self Care: Primary Team Goal: Pt will complete ADL routine and household  transfers with mod I/Active    Problem: Impaired Skin/Wound Mgmt  Skin Wound: Primary Team Goal: Patient  will independently perform weight shifts  to prevent skin breakdown/    Problem: Safety Risk and Restraint  Safety: Primary Team Goal: Patient will follow safety precautions and call for  assistance 100% of the time during hospitalization/    Status update for discharge:      3 Hour Rule Minutes: 60 minutes of SLP treatment this session count towards  intensity and duration of therapy  requirement. Patient was seen for the full  scheduled time of SLP treatment this session.  Therapy Mode Minutes: Individual: 60 minutes.    Signed by: [name], M.A., CCC/SLP 11/15/2019 11:00:00 AM

## 2019-11-15 NOTE — Rehab Progress Note (Medilinks) (Signed)
NAMELORAIN FETTES  MRN: [id number]  Account: [id number]  Session Start: 11/15/2019 11:00:00 AM  Session Stop: 11/15/2019 12:00:00 PM    Total Treatment Minutes: 60.00 Minutes    Physical Therapy  Inpatient Rehabilitation Treatment Note    Rehab Diagnosis: Monophasic Demyelinating Myeloencephalitis  Demographics:            Age: 57Y            Gender: Female  Rehabilitation Precautions/Restrictions:   Fall Precaution and Skin Precaution    OBJECTIVE  Vital Signs:                       Before Activity              After Activity  Vitals  BP Systolic          -                            126  BP Diastolic         -                            90  Pulse                -                            97    Interventions:       Therapeutic Activities:  Zero G training for 1200' with 10% BWS with focus  on balance, obstacles, independence, glu med strengthening for pelvic stability,  and gait.  Patient was returned to or left in bed. Handoff to nurse completed.  Oriented to call bell and placed within reach.  Personal items within reach.  Assistive devices positioned out of reach.  Bed placed in lowest position.    ASSESSMENT  Pt continues to benefit from glut med strengthening and cueing for improved  pelvic stability.  Pt continues to improve gait and balance.  Recommed rollator  for increased safety and idnependence.  Pt to D/C tomorrow.    3 Hour Rule Minutes: 60 minutes of PT treatment this session count towards  intensity and duration of therapy requirement. Patient was seen for the full  scheduled time of PT treatment this session.  Therapy Mode Minutes: Individual: 60 minutes.    Signed by: [name], DPT 11/15/2019 12:00:00 PM

## 2019-11-15 NOTE — Progress Notes (Signed)
PROGRESS NOTE -- FACE-TO-FACE ENCOUNTER  PHYSICAL MEDICINE AND REHABILITATION      Date Time: 11/15/19 3:41 PM  Patient Name: Christy Mcintosh, Christy Mcintosh    Admission date:  11/06/2019  Functional Status/Update:   I reviewed patient's therapy notes to assess functional status and ongoing need for therapies.     PT: Pt has progressed very well during her  prolonged illness after colectomy.  Pt has progressed well with balance, motor  control, strength, and endurance.  Pt does need increaesd time to process  commands, information.  Pt will benefit from follow up from HHPT for continued  progression of safety and independence in the home and the community. Pt is  safest and is recommended to use a rollator at this time.  Pt would also benefit  from continued glut med strengthening for pelvic stability.  Pt tends to flex  knees with standing, vcs do correct.    SLP: Pt and SLP reviewed compensatory strategies and areas  addressed in ST, including pacing scale, time management and organization  strategies. SLP provided patient with typed summary of attention/processing  strategies in order to include in HEP. Pt highlighted bullet points that  pertained to her specific rehab plan without additional prompting. While  incorporating learned organization tools (ie google calendar, med notifications  on phone, daily schedule), pt was able to demonstrate adequate use and function  of 100% sample tasks  provided incidental cueing from therapist.    OT: Pt received supine in bed, alert, and agreeable  to OT tx. Pt completed bed mobility and ambulated within room to retrieve  items/access bathroom with mod I and rollator for balance. Pt completed full  morning ADL routine (see CARE tool for performance details.) 1 VC required for  appropriate use of brakes on rollator 2/2 novelty of AD. Pt engaged in  discussion regarding discharge recommendations and status with pt agreeable and  verbalizing understanding.    Subjective/ROS:   Patient was  seen and examined today. She is doing well.  Shares that "I was just playing with you yesterday" when she demanded to go home.  She continues to do well in therapy and is looking forward to going home tomorrow.  Medications:   Medications reviewed by me:     Scheduled Meds: PRN Meds:    acetaminophen, 1,000 mg, Oral, TID  ALPRAZolam, 2 mg, Oral, QHS  amphetamine-dextroamphetamine, 20 mg, Oral, BID  carboxymethylcellulose (PF), 1 drop, Left Eye, BID  DULoxetine, 60 mg, Oral, Daily  enoxaparin, 40 mg, Subcutaneous, Daily  ezetimibe, 10 mg, Oral, QHS  ferrous sulfate, 324 mg, Oral, QAM W/BREAKFAST  gabapentin, 300 mg, Oral, Q8H SCH  lacosamide, 150 mg, Oral, BID  levothyroxine, 50 mcg, Oral, Daily at 0600  lidocaine, 1 patch, Transdermal, Q24H  losartan, 25 mg, Oral, Daily  pantoprazole, 40 mg, Oral, Daily at 0600  rosuvastatin, 40 mg, Oral, QHS  thiamine, 100 mg, Oral, Daily  vitamin D (cholecalciferol), 25 mcg, Oral, Daily  zonisamide, 200 mg, Oral, BID        Continuous Infusions:   acetaminophen, 650 mg, Q6H PRN  dextrose, 15 g of glucose, PRN   And  dextrose, 12.5 g, PRN   And  glucagon (rDNA), 1 mg, PRN  methocarbamol, 750 mg, TID PRN  oxyCODONE, 5 mg, Q6H PRN  polyethylene glycol, 17 g, Daily PRN  senna-docusate, 2 tablet, BID PRN  simethicone, 80 mg, Q6H PRN              Physical Exam:  Vitals:    11/15/19 0457 11/15/19 0826 11/15/19 0830 11/15/19 1124   BP: 102/73 110/78  126/90   Pulse: 87 92 88 97   Resp: 16 16     Temp: 98.4 F (36.9 C) 98.2 F (36.8 C)     TempSrc: Oral Oral     SpO2: 100% 99%     Weight:       Height:         General: NAD, well nourished  HEENT: NC/AT, mouth mucosa pink and moist  Heart: RRR  Lungs: CTAB  Abdomen: Soft, non-tender, +BS  Extremities: No edema, palpable pulses  Neuro: AAOx3, speech fluent    Intake and Output Summary (Last 24 hours) at Date Time    Intake/Output Summary (Last 24 hours) at 11/15/2019 1541  Last data filed at 11/15/2019 1300  Gross per 24 hour   Intake 950  ml   Output 2 ml   Net 948 ml     P.O.: 240 mL (11/15/19 1300)     Urine: 1 mL(unmeasured output) (11/15/19 0800)               Labs:   No results for input(s): GLUCOSEWHOLE in the last 24 hours.    Recent Labs   Lab 11/14/19  0515 11/11/19  0523   WBC 3.78 3.61   Hgb 10.1* 9.3*   Hematocrit 32.3* 28.7*   Platelets 368* 303        Recent Labs   Lab 11/14/19  0515 11/11/19  0523   Sodium 143 143   Potassium 4.3 4.1   Chloride 109 108   CO2 24 24   BUN 20* 17   Creatinine 0.8 0.7   Calcium 9.8 10.0   Glucose 91 74             Results     ** No results found for the last 24 hours. **             Rads:   Radiological Procedure reviewed.  Radiology Results (24 Hour)     ** No results found for the last 24 hours. **          Assessment/Plan:     Continue PT, OT in acute rehabilitation medical setting.  Continue comprehensive and intensive inpatient rehab program, including: Physical therapy 60-120 min daily, 5-6 times per week, Occupational therapy 60-120 min daily, 5-6 times per week, Case management and Rehabilitation nursing for the following:    Peripheral autoimmune demyelinating disease.   Patient Active Problem List   Diagnosis   . Peripheral autoimmune demyelinating disease     #Monophasic demyelinating myeloencephalitis with deficits in mobility and ADLs: OOB, fall precautions  -Imaging significant for transverse myelitis s/p PLEX x 5 started on 2/3 with improvement noted per documentation  -B-1 level pending  [place] B12, folic acid, Vitamin E supplementation as levels are normal/high  Neurology consulted to follow  3/24 Received additional paperwork from [facility], and addendums made to H&P    #Neuro (Hx seizure dz, s/p craniotomy for meningioma resection): Continue Vimpat, Zonegran  Gabapentin  EEG Feb 22 w/o evidence of neuropathy. Overall; clinically a monophasic demyelinating disease  Outpatient Neurology Appointment scheduled at [facility] with Dr. Levin Bacon Nov 26, 2019    #GI (s/p abdominal colectomy with  ileorectal anastamosis 09/12/19 secondary to colonic inertia, GERD, esophageal stricture, IBS): Diarrhea now resolved  3/23 Holcomb'd Marinol  PO low fiber diet-->upgraded to consistent carb 3/23 per patient request  Miralax  PRN for constipation    #Hypercholesterolemia: Zetia    #Pain Management (Hx lumbar radiculopathy, fibromyalgia): Tylenol, Cymbalta, and Lidoderm patches    #Anemia: Monitor CBC  Continue iron and Vitamin C supplement    #DMII: Hypoglycemic on admission, SSI coverage only for now  Medicine consulted to follow    #Hypothyroidism: Synthroid    #DVT Prophylaxis: Use Lovenox    #Bowel/Bladder Management: PVRs to rule out urinary retention. Use Senna and Colace    #Skin Care: Keep skin clean and dry    #Psych: Psychology services for evaluation and treatment as needed for adjustment/coping     #FEN/GI: Upgraded from low fiber to consistent carb diet. Patient had poor appetite on low fiber diet. Nutrition consult  Protonix for GI prophylaxis    #Dispo: [place] to home 11/16/19  Team conference was held 11/12/19. Patient' status and progress  discussed at length today with the interdisciplinary team during team conference ;report with detailed status, progress, and goals will be placed in Medilinks     Face to face discussion with the patient and coordination of care  took more than  35 minutes; more than 50% of time was spent on coordination of care with interdisciplinary team including physical therapist, occupational therapist, SLP, recreational therapist ,rehabilitation nursing, dietician , neuropsychologist ,and case manager     Medication Review  A complete drug regimen review was completed: Yes  A complete drug regimen review was completed and no drug issues were found.      Discussed plan with patient. All questions were answered.  Signed by: Clover Mealy DO    [facility]

## 2019-11-15 NOTE — Discharge Summary (Signed)
IRF DISCHARGE SUMMARY  PROGRESS NOTE:Patient seen and examined on day of discharge.  Medications reviewed. No new complaints. All questions answered.      Date of admission: 11/06/2019    Date of discharge: 11/16/19    Attending physician: Dr. 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  She was taken to the OR on 09/12/19, and is now s/p total abdominal colectomy with ileorectal anastamosis. Post-surgical course was complicated by altered mental status which was thought to be secondary to medication adjustements from her pre-op medications.  She progressed slowly with minimal oral intake and requirement for TPN supplementation.      On Feb 11, hospital course was further complicated by sudden increase in fatigue, focal spinal tenderness, and new onset bilateral lower extremity weakness, hyporeflexia, and urinary urgency. Neurology was consulted, and diagnostic imaging ruled out stroke, but showed a patch long segment of T2 hyperintense signal involving at the anterior and central cord from C7-T11. This was thought to be due to subacute progressive myelopathy, from possible acute B12 deficiency, ischemic myelopathy, or other metabolic derangements from her post-op course.  This was shortly followed by minimal responsiveness, staring, and somnolence within the following 48 hours. There was concern for subclinical seizure activity, but 24h EEG could not  be performed at [facility]  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  Patient notes that symptoms of LE weakness developed while she was in the ICU after contracting a severe purulent UTI with cultures significant for >100k pan-susceptible E. Coliand post-op ileus for which she was treated with IV antibiotics (Ceftriaxone and Flagyl) and NGT compression.    She was transferred to [facility] on 10/11/19 for further treatment of post op needs and new dx of subacute progressive myelopathy requiring Neurology consult. Completed 5/5 PLEX on 10/23/19 with significant improvement noted (patient states she initially could not move her legs). EMG completed on 10/14/19 without s/o of neuropathy. PM+R consult completed as well as neurology consult. The patient was also treated for recurrent UTI/Cystitis 3/10-3/14 with Nitrofurontinin SR 100 mg BID. Cultures were positive for >50k lactose fermenting gram negative rods.  She was able to tolerate a regular diet prior to discharge and TPN was discontinued. Patient was seen by PT/OT/SLP and deemed appropriate for transfer to [facility] on 11/06/19.     Hospital Course:  #Monophasic demyelinating myeloencephalitiswith deficits in mobility and ADLs: OOB, fall precautions  -Imaging significant for transverse myelitiss/p PLEX x 5 started on 2/3 with improvement noted per documentation  -B-1 level pending  Workup at [facility] included:  Copper, heavy metal panels, Folate, B-12, homocysteine, Ceruloplasmin, Methymalnoic acid, Serum Zn, Selenium, Vit A, Vit K =all WNL  Vit E low at 6.7  CSF IgG index, CSF oligoclonal bands=all WNL  CSF myelin basic protein high (167)  SSA, SSB, dsDNA, serum IgG, IgM, IgA=all WNL  Albumin index high (14)  West Nile IgM (-), West Nile IgG (+)  Borellia 41kd Ab IgG (+), 39 IgM (+); on  repeat Feb 20 only band 41 IgG (+)  VDRL non-reactive, CSF fungal, AFB tests (-)  SPEP, UPEP =all  WNL  Lumbar puncture Feb 20 CSF glucose 89->91, CSF protein 70->52, Lymph 88%, Mono 14%  Inflammatory markers Feb 16-> ESR >120, CRP 46  [facility] paraneoplastic panel pending  [place]bury B12,folic acid, Vitamin E supplementation as levels are normal/high  Neurology consulted to follow    #Neuro (Hx seizure dz, s/p craniotomy for meningioma resection): Continue Vimpat, Zonegran  Gabapentin  EEG Feb 22 w/o evidence of neuropathy. Overall; clinically a monophasic demyelinating disease  Outpatient Neurology Appointment scheduled at [facility] with Dr. 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, GERD, esophageal stricture, IBS): Diarrhea now resolved  3/23 Scammon'd Marinol  POlow fiberdiet-->upgraded to consistent carb 3/23 per patient request  Miralax PRN for constipation    #Hypercholesterolemia: Zetia    #Pain Management(Hx lumbar radiculopathy, fibromyalgia):Tylenol, Cymbalta, and Lidoderm patches    #Anemia: Monitor CBC  Continue ironand Vitamin Csupplement    #DMII:Hypoglycemic on admission, SSI coverageonly for now  Medicine consulted to follow    #Hypothyroidism: Synthroid    #DVT Prophylaxis: Use Lovenox    #Bowel/Bladder Management: PVRs to rule out urinary retention. Use Senna and Colace    #Skin Care: Keep skin clean and dry    #Psych: Psychology services for evaluation and treatment as needed for adjustment/coping     #FEN/GI:Upgraded from low fiber to consistent carb diet. Patient had poor appetite on low fiber diet. Nutrition consult  Protonix for GI prophylaxis    Physical Exam  Blood pressure 126/90, pulse 97, temperature 98.2 F (36.8 C), temperature source Oral, resp. rate 16, height 1.6 m (5\' 3" ), weight 63.6 kg (140 lb 3.4 oz), SpO2 99 %.  General: NAD, well nourished  HEENT: NC/AT, mouth mucosa pink and moist  Heart: RRR  Lungs: CTAB  Abdomen: Soft, non-tender, +BS  Extremities: No edema, palpable pulses  Neuro:  AAOx3, speech fluent    Functional status on Discharge:  SLP: Patient demonstrated steady progression through  cognitive goals, improving from overall moderate assistance to manage 2-3 step  tasks and process incoming information, to min assist for many simulated  iadls.SLP introduced several organization based strategies including cognitive  pacing scale with visual aid, smart phone based apps to manage appts (google  calendar), meds (medisafe), and daily scheduling. Patient will be returning home  in [place], [place] with spouse who will be providing supervision - assistance, as  needed,  in home setting.    OT: Eating: 06 - Independent without an assistive device    Oral Hygiene: 06 - Independent without an assistive device    Toileting Hygiene: 06 - Independent with an assistive device  Toileting Equipment:   Toilet  Assistive Device(s):   Adaptive device to maintain balance    Shower/Bathe Self: 05 - Setup or Clean Up Assistance: Helper sets up or cleans  up prior to or following an activity  Location:  Shower.  Assistive Device(s):   Grab bar/arm rest to maintain balance, Hand held shower,  Shower chair    Upper Body Dressing:   06 - Independent with an assistive device  Assistive Device(s):   Assistive device for item retrieval    Lower Body Dressing: 06 - Independent with an assistive device  Assistive Device(s):   Assistive device for item retrieval/balance    Putting On/Taking Off Footwear: 06 - Independent without an assistive device  Mobility Functional Assessment  Toilet Transfer: 06 - Independent with an assistive device  Assistive Device(s):   Rolling walker    Picking Up Objects: 06 - Independent with an assistive device  Assistive Device(s:)   Assistive device to maintain balance    PT: Roll Left and Right: 06 - Independent without an assistive device    Sit to Lying: 06 - Independent without an assistive device    Lying to Sitting on Side of Bed: 06 - Independent without an assistive  device    Sit to Stand: 06 - Independent without an assistive device    Chair/Bed-to-Chair Transfer: 06 - Independent with an assistive device  Assistive Device(s):    rollator    Car Transfer: 06 - Independent with an assistive device   rollator    Walk 10 Feet: 06 - Independent with an assistive device   rollator    Walk 50 Feet with Two Turns: 04 - Supervision: Helper provides supervision for  safety or verbal cues ONLY   rollator    Walk 150 Feet:  04 - Supervision: Helper provides supervision for safety or  verbal cues ONLY   rollator    Walking 10 Feet on Uneven Surface: 04 - Supervision: Helper provides supervision  for safety or verbal cues ONLY   SPC on rollator    1 Step (curb):  04 - Supervision: Helper provides supervision for safety or  verbal cues ONLY   with HR    4 Steps:  04 - Supervision: Helper provides supervision for safety or verbal  cues ONLY   with HR    12 Steps: 04 - Supervision: Helper provides supervision for safety or verbal  cues ONLY   with HR    Discharge location: Home with assistance      Discharge instructions:  Refrain from driving pending further evaluation by physician and/or OT.  Follow-up with primary care physician within 2 weeks.      Rehabilitation services required:  Outpatient PT/OT      Rehabilitation equipment required:  Cane for impaired mobility    Discharge medications:     Medication List      START taking these medications    acetaminophen 325 MG tablet  Commonly known as: TYLENOL  Take 2 tablets (650 mg total) by mouth every 6 (six) hours as needed for Pain     ALPRAZolam 2 MG tablet  Commonly known as: XANAX  Take 1 tablet (2 mg total) by mouth nightly     amphetamine-dextroamphetamine 5 MG tablet  Commonly known as: ADDERALL  Take 4 tablets (20 mg total) by mouth 2 (two) times daily     carboxymethylcellulose (PF) 0.5 % ophthalmic solution  Commonly known as: REFRESH PLUS  Place 1 drop into both eyes 2 (two) times daily as needed (dry, irritated  eyes)     cholecalciferol 25 MCG (1000 UT) tablet  Commonly known as: vitamin D3  Take 1 tablet (25 mcg total) by mouth daily     DULoxetine 60 MG capsule  Commonly known as: CYMBALTA  Take 1 capsule (60 mg total) by mouth daily     ezetimibe 10 MG tablet  Commonly known as: ZETIA  Take 1 tablet (10 mg total) by mouth nightly     ferrous sulfate 324 (65 FE) MG Tbec  Take 1 tablet (324 mg total) by mouth every morning with breakfast     gabapentin 300 MG capsule  Commonly known as: NEURONTIN  Take 1 capsule (300  mg total) by mouth every 8 (eight) hours     lacosamide 150 MG Tabs  Take 1 tablet (150 mg total) by mouth 2 (two) times daily     levothyroxine 50 MCG tablet  Commonly known as: SYNTHROID  Take 1 tablet (50 mcg total) by mouth Once a day at 6:00am     lidocaine 5 %  Commonly known as: LIDODERM  Place 1 patch onto the skin every 24 hours Remove & Discard patch within 12 hours or as directed by MD     losartan 25 MG tablet  Commonly known as: COZAAR  Take 1 tablet (25 mg total) by mouth daily     methocarbamol 750 MG tablet  Commonly known as: ROBAXIN  Take 1 tablet (750 mg total) by mouth 3 (three) times daily as needed (Pain)     naloxone 4 MG/0.1ML nasal spray  Commonly known as: NARCAN  1 spray intranasally. If pt does not respond or relapses into respiratory depression call 911. Give additional doses every 2-3 min.     oxyCODONE 5 MG immediate release tablet  Commonly known as: ROXICODONE  Take 1 tablet (5 mg total) by mouth every 6 (six) hours as needed for Pain     pantoprazole 40 MG tablet  Commonly known as: PROTONIX  Take 1 tablet (40 mg total) by mouth Once a day at 6:00am     rosuvastatin 40 MG tablet  Commonly known as: CRESTOR  Take 1 tablet (40 mg total) by mouth nightly     simethicone 80 MG chewable tablet  Commonly known as: MYLICON  Chew 1 tablet (80 mg total) by mouth every 6 (six) hours as needed for Flatulence     thiamine 100 MG tablet  Commonly known as: B-1  Take 1 tablet (100 mg  total) by mouth daily     zonisamide 100 MG capsule  Commonly known as: ZONEGRAN  Take 2 capsules (200 mg total) by mouth 2 (two) times daily           Where to Get Your Medications      You can get these medications from any pharmacy    Bring a paper prescription for each of these medications   ALPRAZolam 2 MG tablet   amphetamine-dextroamphetamine 5 MG tablet   carboxymethylcellulose (PF) 0.5 % ophthalmic solution   cholecalciferol 25 MCG (1000 UT) tablet   DULoxetine 60 MG capsule   ezetimibe 10 MG tablet   ferrous sulfate 324 (65 FE) MG Tbec   gabapentin 300 MG capsule   lacosamide 150 MG Tabs   levothyroxine 50 MCG tablet   lidocaine 5 %   losartan 25 MG tablet   methocarbamol 750 MG tablet   naloxone 4 MG/0.1ML nasal spray   oxyCODONE 5 MG immediate release tablet   pantoprazole 40 MG tablet   rosuvastatin 40 MG tablet   simethicone 80 MG chewable tablet   thiamine 100 MG tablet   zonisamide 100 MG capsule     Information about where to get these medications is not yet available    Ask your nurse or doctor about these medications   acetaminophen 325 MG tablet         Medication Review  A complete drug regimen review was completed: Yes   No drug issues were found.    Diet:  Regular    Recent labs/imaging  Lab Results   Component Value Date    WBC 3.78 11/14/2019    HGB 10.1 (L)  11/14/2019    HCT 32.3 (L) 11/14/2019    MCV 97.0 (H) 11/14/2019    PLT 368 (H) 11/14/2019     Lab Results   Component Value Date    BUN 20 (H) 11/14/2019     Lab Results   Component Value Date    CREAT 0.8 11/14/2019     Lab Results   Component Value Date    NA 143 11/14/2019    K 4.3 11/14/2019    CL 109 11/14/2019    CO2 24 11/14/2019     Lab Results   Component Value Date    ALT 18 11/07/2019    AST 17 11/07/2019    ALKPHOS 74 11/07/2019    BILITOTAL 0.2 11/07/2019     No results found for: INR, PT    No results found.      Follow Ups:  Virtual appointment scheduled for Monday, April 5 at 2:00 pm    [name]  Colorectal Surgery  [facility]    Virtual appointment scheduled for Tuesday, April 6    [name] [facility]  Neurology  [facility]    CC: Patsy Lager, MD (None)

## 2019-11-15 NOTE — Rehab Progress Note ([employer]) (Signed)
[name]  MRN: [id number]  Account: [id number]  Session Start: 11/15/2019 12:00:00 AM  Session Stop: 11/15/2019 12:00:00 AM    Total Treatment Minutes:  Minutes    Rehabilitation Nursing  Inpatient Rehabilitation Shift Assessment    Rehab Diagnosis: Monophasic Demyelinating Myeloencephalitis  Demographics:            Age: 77Y            Gender: Female  Primary Language: English    Date of Onset:  09/12/19  Date of Admission: 11/06/2019 5:56:00 PM    Rehabilitation Precautions Restrictions:   Fall Precaution and Skin Precaution    Patient Report: "I'm going home tomorrow."  Patient/Caregiver Goals:  "I want to get stronger and go back home"    Wounds/Incisions: No wounds or incisions.    Medication Review: No clinically significant medication issues identified this  shift.    Bowel and Bladder Output:                       Bladder (# only)             Bowel (# only)  Number of Episodes  Continent            3                            1  Incontinent          0                            0    Additional Education Provided:    Education Provided: Pain management. Pain scale. Medication options. Side  effects. Skin/wound care. Critical pressure areas. Prevention of skin breakdown.  Nutrition/feeding. Dietary recommendations. Safety. Fall prevention/balance  training. Medication. Name and dosage. Administration. Purpose. Side Effects.  Anticoagulants.       Audience: Patient.       Mode: Explanation.       Response: Verbalized understanding.    TEAM CARE PLAN  Identified problems from team documentation:  Problem: Impaired Cognition    Problem: Impaired Mobility  Mobility: Primary Team Goal: Pt will perform all mobility with ModI with LRAD  for safe D/C home./Not Met    Problem: Impaired Pain Management  Pain Mgmt: Primary Team Goal: Patient will verbalize pain less than or equal to  2 out of 10 with the use of medication and positioning techniques/    Problem: Impaired Psychosocial Skills/Behavior  PsychoSocial:  Primary Team Goal: Maintain healthy adjustment to the  rehabilitation process and decrease stress related to functional changes./    Problem: Impaired Self-care Mgmt/ADL/IADL  Self Care: Primary Team Goal: Pt will complete ADL routine and household  transfers with mod I/Met    Problem: Impaired Skin/Wound Mgmt  Skin Wound: Primary Team Goal: Patient  will independently perform weight shifts  to prevent skin breakdown/    Problem: Safety Risk and Restraint  Safety: Primary Team Goal: Patient will follow safety precautions and call for  assistance 100% of the time during hospitalization/    Please review Integrated Patient View Care Plan Flowsheet for Team identified  Problems, Interventions, and Goals.    Signed by: Bosie Clos, RN 11/15/2019 12:31:00 PM

## 2019-11-15 NOTE — Rehab Discharge Summary (Medilinks) (Signed)
[name]  MRN: [id number]  Account: [id number]  Session Start: 11/15/2019 12:00:00 AM  Session Stop: 11/15/2019 12:00:00 AM    Total Treatment Minutes:  Minutes    Physical Therapy  Inpatient Rehabilitation Discharge Summary    Rehab Diagnosis: Monophasic Demyelinating Myeloencephalitis  Demographics:            Age: 59Y            Gender: Female  Primary Language: English  Past Medical History: Interstitial Cystitis  DM  HTN  Hypothyroidism  Asthma  GERD/IBS  Seizures D/O  Fibromyalgia  DDD Lumbar Spine  Overactive Bladder  Chronic Constipation  GI bleed  Esophageal Stricture    PSH:  Appendectomy  Bladder sling  Hysterectomy  BS and ovarian removal  Craniotomy secondary to meningioma  Breast Bx's  Bilateral knee meniscus surgeries  Cataract Surgeries  Rhinoplasty  Rotator cuff surgery  History of Present Illness: Christy Mcintosh is a 59 year old female originally  admitted to [facility] in January 2021 with colonic  inertia.  She was taken to the OR on 09/12/19 for Colonic Inertia, S/P Total  abdominal Colectomy with ileorectal anastamosis. Post op complicated by ilieus  and was transferred to [facility] on 10/11/19 for further treatment of post op needs  and new dx of subacute progressive myelopathy requiring Neurology consult.  The  patient was also receiving TPN, now discontinued and the patient is tolerating  diet well with improved intake. On Marinol-appetite stimulant. Regular diet with  Ensure BID.+BM.  Regarding monophasic Autoimmune Demyelinating  Myeloencephalitis-presented with subacute progressive myelopathy on 10/03/19  with BLE paraparesis and hyporeflexia.  Imaging significant for transverse  myelitis with patchy, non diffusing T2 abnormalities involving the corticospinal  tracts.  Completed 5/5 PLEX started on 09/25/19 with improvement noted.  EMG  completed on 10/14/19 without s/o of neuropathy.  PM+R consult completed as well  as neurology consult.  The patient was also  followed for the following acute  medical conditions: UTI/Cystitis- Started on Nitrofurontinin SR 100 mg BID  Started on March 10th, myeloradicular lumbar back pain- continues on Tylenol,  Cymbalta, Robaxin, and Lidoderm patches, Anemia-Continue Iron and monitor H+H  6.  MRSA Precautions-Resolved, Testing negative from 10/23/19, pt removed from  serial precautions. Hx of DM, Continue Insulin as ordered. Seizure Disorder, Hx  of Craniotomy for Meningioma resection.  Continues on PO Vimpat.   Hypothyroidism-Continue on PO Synthroid.  The patient is alert and oriented x4,  pleasant and cooperative with care needs. Tolerating regular diet well, + BM.  Paraparesis improving and the patient is working well with therapy services with  progress made.  The patient has been working well with therapy services to  include PT and OT to address functional mobility, self care deficit, and pain.  The patient would benefit from an intensive level of rehab along with close  medical management of multiple medical conditions and rehab MD to drive the  rehab process.  The patient is medically stable for IRF level of care.   Date of Onset: 09/12/19   Date of Admission: 11/06/2019 5:56:00 PM  Rehabilitation Precautions/Restrictions:   Fall Precaution and Skin Precaution    OBJECTIVE    Education Provided:    Education Provided: Bed mobility. Functional transfers. Fall prevention/balance  training. Gait. Stair/curb/environmental barrier negotiation.       Audience: Patient.       Mode: Explanation.  Demonstration.  Teacher, English as a foreign language provided.       Response: Applied  knowledge.  Verbalized understanding.  Demonstrated skill.  Needs practice.  Needs reinforcement.    Lower Extremity Orthosis: Patient does not present with foot drop or ankle  instability and does not need an orthotic.  Home Exercise Program: The patient was provided with an individualized home  exercise program.  Equipment Provided/Ordered: The patient has been given voluntary  selection of a  medical equipment provider, as per federal law. Arrangements have been made with  a company of the patient?s choosing. The patient has been educated that choosing  a company outside of [employer] company?s preferred provider may affect their  insurance coverage.The patient has been provided with information on care of  equipment as appropriate. Pt borrowed a rollator from a friend and currently has  a SPC.    ASSESSMENT  Summary of Progress and Current Status: Pt has progressed very well during her  prolonged illness after colectomy.  Pt has progressed well with balance, motor  control, strength, and endurance.  Pt does need increaesd time to process  commands, information.  Pt will benefit from follow up from HHPT for continued  progression of safety and independence in the home and the community. Pt is  safest and is recommended to use a rollator at this time.  Pt would also benefit  from continued glut med strengthening for pelvic stability.  Pt tends to flex  knees with standing, vcs do correct.    CARE Tool Discharge Performance  Mobility Functional Assessment  Roll Left and Right: 06 - Independent without an assistive device    Sit to Lying: 06 - Independent without an assistive device    Lying to Sitting on Side of Bed: 06 - Independent without an assistive device    Sit to Stand: 06 - Independent without an assistive device    Chair/Bed-to-Chair Transfer: 06 - Independent with an assistive device  Assistive Device(s):    rollator    Car Transfer: 06 - Independent with an assistive device   rollator    Walk 10 Feet: 06 - Independent with an assistive device   rollator    Walk 50 Feet with Two Turns: 04 - Supervision: Helper provides supervision for  safety or verbal cues ONLY   rollator    Walk 150 Feet:  04 - Supervision: Helper provides supervision for safety or  verbal cues ONLY   rollator    Walking 10 Feet on Uneven Surface: 04 - Supervision: Helper provides supervision  for safety or  verbal cues ONLY   SPC on rollator    1 Step (curb):  04 - Supervision: Helper provides supervision for safety or  verbal cues ONLY   with HR    4 Steps:  04 - Supervision: Helper provides supervision for safety or verbal  cues ONLY   with HR    12 Steps: 04 - Supervision: Helper provides supervision for safety or verbal  cues ONLY   with HR    Wheelchair: Patient does not use a wheelchair or scooter.    Progress Toward Goals (final status):   LONG TERM GOAL REVIEW:       1. Pt will be I with stand pivot transfers with LRAD. - Met       2. Pt will gait 150' with ModI with LRAD. - Not Met supervision for safety       3. Pt will negotiate up/down 1 step with LRAD with ModI to enter home. -  Not Met supervision for safety  PLAN  Physical Therapy Plan: Patient is recommended for home health therapy services.    Team Care Plan  Please review Integrated Patient View Care Plan Flowsheet for Team identified  Problems, Interventions, and Goals.    Identified problems from team documentation:  Problem: Impaired Cognition    Problem: Impaired Mobility  Mobility: Primary Team Goal: Pt will perform all mobility with ModI with LRAD  for safe D/C home./Active    Problem: Impaired Pain Management  Pain Mgmt: Primary Team Goal: Patient will verbalize pain less than or equal to  2 out of 10 with the use of medication and positioning techniques/    Problem: Impaired Psychosocial Skills/Behavior  PsychoSocial: Primary Team Goal: Maintain healthy adjustment to the  rehabilitation process and decrease stress related to functional changes./    Problem: Impaired Self-care Mgmt/ADL/IADL  Self Care: Primary Team Goal: Pt will complete ADL routine and household  transfers with mod I/Active    Problem: Impaired Skin/Wound Mgmt  Skin Wound: Primary Team Goal: Patient  will independently perform weight shifts  to prevent skin breakdown/    Problem: Safety Risk and Restraint  Safety: Primary Team Goal: Patient will follow safety precautions and  call for  assistance 100% of the time during hospitalization/    Status update for discharge:     Mobility:   Primary Goal: Not Met  Discharge Status Comment:   Pt performs transfers with ModI, gaits with S/Modi  with rollator and negotiates stairs with S with HR.    Signed by: [name], DPT 11/15/2019 12:01:00 PM

## 2019-11-15 NOTE — Rehab Discharge Summary (Medilinks) (Signed)
Christy Mcintosh  MRN: [id number]  Account: [id number]  Session Start: 11/15/2019 12:00:00 AM  Session Stop: 11/15/2019 12:00:00 AM    Total Treatment Minutes:  Minutes    Therapeutic Recreation  Inpatient Rehabilitation Discharge Summary    Rehab Diagnosis: Monophasic Demyelinating Myeloencephalitis  Demographics:            Age: 59Y            Gender: Female    Past Medical History: Interstitial Cystitis  DM  HTN  Hypothyroidism  Asthma  GERD/IBS  Seizures D/O  Fibromyalgia  DDD Lumbar Spine  Overactive Bladder  Chronic Constipation  GI bleed  Esophageal Stricture    PSH:  Appendectomy  Bladder sling  Hysterectomy  BS and ovarian removal  Craniotomy secondary to meningioma  Breast Bx's  Bilateral knee meniscus surgeries  Cataract Surgeries  Rhinoplasty  Rotator cuff surgery  History of Present Illness: Christy Mcintosh is a 59 year old female originally  admitted to [facility] in January 2021 with colonic  inertia.  She was taken to the OR on 09/12/19 for Colonic Inertia, S/P Total  abdominal Colectomy with ileorectal anastamosis. Post op complicated by ilieus  and was transferred to [facility] on 10/11/19 for further treatment of post op needs  and new dx of subacute progressive myelopathy requiring Neurology consult.  The  patient was also receiving TPN, now discontinued and the patient is tolerating  diet well with improved intake. On Marinol-appetite stimulant. Regular diet with  Ensure BID.+BM.  Regarding monophasic Autoimmune Demyelinating  Myeloencephalitis-presented with subacute progressive myelopathy on 10/03/19  with BLE paraparesis and hyporeflexia.  Imaging significant for transverse  myelitis with patchy, non diffusing T2 abnormalities involving the corticospinal  tracts.  Completed 5/5 PLEX started on 09/25/19 with improvement noted.  EMG  completed on 10/14/19 without s/o of neuropathy.  PM+R consult completed as well  as neurology consult.  The patient was also followed for the  following acute  medical conditions: UTI/Cystitis- Started on Nitrofurontinin SR 100 mg BID  Started on March 10th, myeloradicular lumbar back pain- continues on Tylenol,  Cymbalta, Robaxin, and Lidoderm patches, Anemia-Continue Iron and monitor H+H  6.  MRSA Precautions-Resolved, Testing negative from 10/23/19, pt removed from  serial precautions. Hx of DM, Continue Insulin as ordered. Seizure Disorder, Hx  of Craniotomy for Meningioma resection.  Continues on PO Vimpat.   Hypothyroidism-Continue on PO Synthroid.  The patient is alert and oriented x4,  pleasant and cooperative with care needs. Tolerating regular diet well, + BM.  Paraparesis improving and the patient is working well with therapy services with  progress made.  The patient has been working well with therapy services to  include PT and OT to address functional mobility, self care deficit, and pain.  The patient would benefit from an intensive level of rehab along with close  medical management of multiple medical conditions and rehab MD to drive the  rehab process.  The patient is medically stable for IRF level of care.            Date of Onset:  09/12/19            Date of Admission: 11/06/2019 5:56:00 PM    Rehabilitation Precautions/Restrictions:   Fall Precaution and Skin Precaution    Pt d/c scheduled for 11/16/19. Pt did not participate in structured TR sessions  due to c/o pain and decreased activity tolerance. D/c from TR services.    Signed by: [name], CTRS  11/15/2019 8:22:00 AM

## 2019-11-16 NOTE — Progress Notes (Signed)
Patient was d/c home with all belongings. All d/c instructions given to patient and she verbalized understanding. Denies any pain, no distress noted. Prescriptions were given. Pt own medications were given. Patient was wheeled to lobby with all belongings. VSS.

## 2019-11-16 NOTE — Rehab Discharge Summary (Medilinks) (Addendum)
Corrected 11/16/2019 1:18:10 AM    NAME: Christy Mcintosh  MRN: [id number]  Account: [id number]  Session Start: 11/16/2019 12:00:00 AM  Session Stop: 11/16/2019 12:00:00 AM    Total Treatment Minutes:  Minutes    Inpatient Rehabilitation  Rehabilitation Nursing Discharge Summary    Rehab Diagnosis: Monophasic Demyelinating Myeloencephalitis  Demographics:            Age: 76Y            Gender: Female  Primary Language: English    Date of Onset:  09/12/19  Date of Admission: 11/06/2019 5:56:00 PM    Rehabilitation Precautions Restrictions:   Fall Precaution and Skin Precaution    Discharge:  Patient discharged to:   Home  At discharge, the patient was discharged to live (with):  Family / Relatives.  Follow up providers include: Family. out patient PT/OT    Patient Report: " Iam ready to go home tomorrow'  Patient/Caregiver Goals:  "I want to get stronger and go back home"    Wounds/Incisions: No wounds or incisions.    Medication Review: No clinically significant medication issues identified this  shift.  Additional Education Provided:    Education Provided: Safety issues and interventions. Fall protocol. Supervision  requirements. Skin/wound care. Signs/symptoms of infection. Role of nutrition in  wound prevention/healing. Critical pressure areas. Skin care for the incontinent  patient. Prevention of skin breakdown. Medication. Name and dosage.  Administration. Purpose. Side Effects. Anticoagulants. Pain management. Pain  scale. Medication options. Side effects. Clinical indicators of pain.       Audience: Patient.       Mode: Explanation.  Teacher, English as a foreign language provided.       Response: Verbalized understanding.    PLAN  Recommendations for Follow-Up Care:   Yes, patient received valuables.  Bladder Program: Patient is continent of bowel and bladder.Ambulates to  bathroom.  Bowel Program:  Last Bowel Movement- 11/14/19   Continent of bowel.  Skin: Skin intact.  Current Diet: Consistent Carbohydrate, low Lactose  Pain Management:  Medication. Tylenol 1000 mg po 3 times daily scheduled.Tylenol  650 mg po 6 hrly PRN for mild pain and oxycodone IR  5 mg po Q6 hrly for  moderate pain. Diabetics controlled by CHO diet    Care Plan  Identified problems from team documentation:  Problem: Impaired Cognition    Problem: Impaired Mobility  Mobility: Primary Team Goal: Pt will perform all mobility with ModI with LRAD  for safe D/C home./Not Met    Problem: Impaired Pain Management  Pain Mgmt: Primary Team Goal: Patient will verbalize pain less than or equal to  2 out of 10 with the use of medication and positioning techniques/    Problem: Impaired Psychosocial Skills/Behavior  PsychoSocial: Primary Team Goal: Maintain healthy adjustment to the  rehabilitation process and decrease stress related to functional changes./    Problem: Impaired Self-care Mgmt/ADL/IADL  Self Care: Primary Team Goal: Pt will complete ADL routine and household  transfers with mod I/Met    Problem: Impaired Skin/Wound Mgmt  Skin Wound: Primary Team Goal: Patient  will independently perform weight shifts  to prevent skin breakdown/    Problem: Safety Risk and Restraint  Safety: Primary Team Goal: Patient will follow safety precautions and call for  assistance 100% of the time during hospitalization/    Status update for discharge:     Mobility:   Primary Goal: Not Met  Discharge Status Comment:   Resident transfers with mod I    Please review  Integrated Patient View Care Plan Flowsheet for Team identified  Problems, Interventions, and Goals.    Signed by: Liana Gerold, RN 11/16/2019 1:09:00 AM

## 2019-11-16 NOTE — Progress Notes (Signed)
PHYSICAL MEDICINE AND REHABILITATION  Discharge NOTE  FACE TO Bruce Donath    Date Time: 11/16/19 9:44 AM    Patient Name: Christy Mcintosh, Christy Mcintosh  Admission date:  11/06/2019  (LOS: 10 days)    Subjective:     Patient seen morning in her room   She is ready for discharge home today, she is flying to [facility]  Denies pain or any new issues     Functional Status:     PT: Roll Left and Right: 06 - Independent without an assistive device    Sit to Lying: 06 - Independent without an assistive device    Lying to Sitting on Side of Bed: 06 - Independent without an assistive device    Sit to Stand: 06 - Independent without an assistive device    Chair/Bed-to-Chair Transfer: 06 - Independent with an assistive device  Assistive Device(s): rollator    Car Transfer: 06 - Independent with an assistive device  rollator    Walk 10 Feet: 06 - Independent with an assistive device  rollator    Walk 50 Feet with Two Turns: 04 - Supervision: Helper provides supervision for  safety or verbal cues ONLY  rollator    Walk 150 Feet: 04 - Supervision: Helper provides supervision for safety or  verbal cues ONLY  rollator    Walking 10 Feet on Uneven Surface: 04 - Supervision: Helper provides supervision  for safety or verbal cues ONLY  SPC on rollator    1 Step (curb): 04 - Supervision: Helper provides supervision for safety or  verbal cues ONLY  with HR    4 Steps: 04 - Supervision: Helper provides supervision for safety or verbal  cues ONLY  with HR    12 Steps: 04 - Supervision: Helper provides supervision for safety or verbal  cues ONLY  with HR    Medications:   Medication reviewed by me:     Scheduled Meds: PRN Meds:    acetaminophen, 1,000 mg, Oral, TID  ALPRAZolam, 2 mg, Oral, QHS  amphetamine-dextroamphetamine, 20 mg, Oral, BID  carboxymethylcellulose (PF), 1 drop, Left Eye, BID  DULoxetine, 60 mg, Oral, Daily  enoxaparin, 40 mg, Subcutaneous, Daily  ezetimibe, 10 mg, Oral, QHS  ferrous sulfate, 324 mg, Oral, QAM  W/BREAKFAST  gabapentin, 300 mg, Oral, Q8H SCH  lacosamide, 150 mg, Oral, BID  levothyroxine, 50 mcg, Oral, Daily at 0600  lidocaine, 1 patch, Transdermal, Q24H  losartan, 25 mg, Oral, Daily  pantoprazole, 40 mg, Oral, Daily at 0600  rosuvastatin, 40 mg, Oral, QHS  thiamine, 100 mg, Oral, Daily  vitamin D (cholecalciferol), 25 mcg, Oral, Daily  zonisamide, 200 mg, Oral, BID        Continuous Infusions:   acetaminophen, 650 mg, Q6H PRN  dextrose, 15 g of glucose, PRN   And  dextrose, 12.5 g, PRN   And  glucagon (rDNA), 1 mg, PRN  methocarbamol, 750 mg, TID PRN  oxyCODONE, 5 mg, Q6H PRN  polyethylene glycol, 17 g, Daily PRN  senna-docusate, 2 tablet, BID PRN  simethicone, 80 mg, Q6H PRN          Medication Review  1. A complete drug regimen review was completed: Yes  2. Were drug issues were found during review: No   If yes to drug issues found during review:   What was the issue?:    What was the time the issue was identified?:    Was I contacted and action was taken by midnight of the next calendar  day once issue was identified?: N/A    Person who contacted me:    Action taken:     Review of Systems:   A comprehensive review of systems was: No fevers, chills, nausea, vomiting, chest pain, shortness of breath, cough, headache, double vision.  All others negative.    Labs:     Recent Labs   Lab 11/14/19  0515 11/11/19  0523   WBC 3.78 3.61   Hgb 10.1* 9.3*   Hematocrit 32.3* 28.7*   Platelets 368* 303      Recent Labs   Lab 11/14/19  0515 11/11/19  0523   Sodium 143 143   Potassium 4.3 4.1   Chloride 109 108   CO2 24 24   BUN 20* 17   Creatinine 0.8 0.7   Calcium 9.8 10.0   Glucose 91 74         Rads:   Radiological Procedure reviewed.  Radiology Results (24 Hour)     ** No results found for the last 24 hours. **        Physical Exam:     Vitals:    11/15/19 0830 11/15/19 1124 11/15/19 1651 11/16/19 0544   BP:  126/90 108/75 119/79   Pulse: 88 97 93 88   Resp:   18 17   Temp:   98.1 F (36.7 C) 98 F (36.7 C)    TempSrc:   Oral Oral   SpO2:   100% 100%   Weight:       Height:           Intake and Output Summary (Last 24 hours) at Date Time    Intake/Output Summary (Last 24 hours) at 11/16/2019 0944  Last data filed at 11/15/2019 2200  Gross per 24 hour   Intake 720 ml   Output 0 ml   Net 720 ml     P.O.: 240 mL (11/15/19 2200)     Urine: 0 mL (11/15/19 2200)             Cardiac: regular rhythm  Chest / Lungs:  Clear to auscultation.  Abdomen:  + bowel sounds, Soft, non-tender, non-distended.  Extremities: no calf tenderness.    Assessment and Plan:     #Monophasic demyelinating myeloencephalitiswith deficits in mobility and ADLs: OOB, fall precautions  -Imaging significant for transverse myelitiss/p PLEX x 5 started on 2/3 with improvement noted per documentation  -B-1 level pending  Workup at [facility] included:  Copper, heavy metal panels, Folate, B-12, homocysteine, Ceruloplasmin, Methymalnoic acid, Serum Zn, Selenium, Vit A, Vit K =all WNL  Vit E low at 6.7  CSF IgG index, CSF oligoclonal bands=all WNL  CSF myelin basic protein high (167)  SSA, SSB, dsDNA, serum IgG, IgM, IgA=all WNL  Albumin index high (14)  West Nile IgM (-), West Nile IgG (+)  [place] 41kd Ab IgG (+), 39 IgM (+); on repeat Feb 20 only band 41 IgG (+)  VDRL non-reactive, CSF fungal, AFB tests (-)  SPEP, UPEP =all WNL  Lumbar puncture Feb 20 CSF glucose 89->91, CSF protein 70->52, Lymph 88%, Mono 14%  Inflammatory markers Feb 16-> ESR >120, CRP 46  [facility] paraneoplastic panel pending  [place] B12,folic acid, Vitamin E supplementation as levels are normal/high  Neurology consulted to follow    #Neuro (Hx seizure dz, s/p craniotomy for meningioma resection): Continue Vimpat, Zonegran  Gabapentin  EEG Feb 22 w/o evidence of neuropathy. Overall; clinically a monophasic demyelinating disease  Outpatient Neurology Appointment scheduled  at [facility] with Dr. Levin Bacon Nov 26, 2019    #GI (s/p abdominal colectomy with ileorectal anastamosis 09/12/19  secondary to colonic inertia, GERD, esophageal stricture, IBS): Diarrhea now resolved  3/23 [place]'d Marinol  POlow fiberdiet-->upgraded to consistent carb 3/23 per patient request  Miralax PRN for constipation      Continue comprehensive and intensive inpatient rehab program, including:   Physical therapy 60-120 min daily, 5-6 times per week, Occupational therapy 60-120 min daily, 5-6 times per week, Case management and Rehabilitation nursing.  Psychology services to evaluate and treat as needed     Signed by: Cheryll Dessert MD  11/16/2019, 9:44 AM    [facility]

## 2019-11-18 NOTE — Rehab Progress Note ([facility]) (Signed)
Christy Mcintosh  MRN: [id number]  Account: [id number]  Session Start: 11/12/2019 12:00:00 AM  Session Stop: 11/12/2019 12:00:00 AM    Total Treatment Minutes:  Minutes    Case Management  Inpatient Rehabilitation Team Conference    Conference Date/Time: 11/12/2019 2:30:00 PM    Demographics            Age: 64Y            Gender: Female    Admission Date: 11/06/2019 5:56:00 PM  Diagnosis: Monophasic Demyelinating Myeloencephalitis  Comorbidities:    VITAL SIGNS  Blood Pressure: 113/82 mmHg  Temperature:  degrees  Pulse:  beats per minute  Respirations:  breaths per minute  Pain: 7/10    WEIGHT and NUTRITION  Admission Weight: 140 pounds; Current Weight: 140pounds  Weight Change since Admit: Patient has had no weight change since admission.  Food Consistency:  Liquid Consistency:None    Plan Of Care  Anticipated Discharge Date/Estimated Length of Stay: 11/20/19  Anticipated Discharge Destination: Community discharge with assistance  Fall Risk Level: Chilton Si (Low)  Case Management: [name], CM  Nurse: [name], RN  Occupational Therapy: [name], OT  Physical Therapy: [name], PT  Physician: [name], DO  Therapeutic Recreation: [name], TR  Psychology Other: [name] NP  Speech Language Pathology Other: [name] SLP    The following is a list of patient problems that have been identified by the  interdisciplinary team:    Problem: Impaired Cognition  Cognition Status Update: Pt utilizes compensatory cognitive strategies provided  range of min to max in unfamiliar tasks, although cueing consistently decreases  near task completion.    Problem: Impaired Mobility  Mobility Status Update: Pt performs stand pivot transfers, gaits without AD 150'  , and negotiates up/down 12 steps with 1 HR with CGA/minA.  Team Identified Barrier to Discharge: Yes  Interventions:  Transfer training: Active  Gait training: Active  Wheelchair propulsion and management: Active  Other Mobility  Intervention 1 - Text: stair training  Other Mobility Intervention 1 - Status: Active  Mobility: Primary Team Goal/Status: Pt will perform all mobility with ModI with  LRAD for safe D/C home. / Active    Problem: Impaired Pain Management  Pain Mgmt: Primary Team Goal/Status: Patient will verbalize pain less than or  equal to 2 out of 10 with the use of medication and positioning techniques /    Problem: Impaired Psychosocial Skills/Behavior  PsychoSocial: Primary Team Goal/Status: Maintain healthy adjustment to the  rehabilitation process and decrease stress related to functional changes. /    Problem: Impaired Self-care Mgmt/ADL/IADL  Self Care/ADL/IADL Status Update: Pt completes ADL routine with close  supervision for safety and problem solving in novel environment/situation  Team Identified Barrier to Discharge: Yes  Interventions:  Adaptive equipment training: Active  Compensatory strategies: Active  Encourage patient to participate in activities of daily living: Active  Self Care: Primary Team Goal/Status: Pt will complete ADL routine and household  transfers with mod I / Active    Problem: Impaired Skin/Wound Mgmt  Skin Wound: Primary Team Goal/Status: Patient  will independently perform weight  shifts to prevent skin breakdown /    Problem: Safety Risk and Restraint  Safety: Primary Team Goal/Status: Patient will follow safety precautions and  call for assistance 100% of the time during hospitalization /    Self Care Functional Status  Eating:  6 - Independent without devices  Oral Hygiene:  4 - Supervision or touching assistance  Toileting:  4 - Supervision or touching assistance  Shower Bathe:  4 - Supervision or touching assistance  Upper Body Dressing:  5 - Setup or clean-up assistance  Lower Body Dressing:  4 - Supervision or touching assistance  Putting On/Taking Off Footwear:  5 - Setup or clean-up assistance    Mobility Functional Status  Roll Left and Right:  6 - Independent without devices  Sit to  Lying:  6 - Independent without devices  Lying to Sitting on Side of Bed:  6 - Independent without devices  Sit to Stand:  4 - Supervision or touching assistance  Chair/Bed-to-Chair Transfer:  4 - Supervision or touching assistance  Toilet Transfer:  4 - Supervision or touching assistance  Car Transfer:  4 - Supervision or touching assistance  Walk 10 Feet:  4 - Supervision or touching assistance  Walk 50 Feet with Two Turns:  3 - Partial/moderate assistance  Walking 150 Feet:  3 - Partial/moderate assistance  Walking 10 Feet on Uneven Surface:  3 - Partial/moderate assistance  1 Step - Curb:  3 - Partial/moderate assistance  4 Steps:  3 - Partial/moderate assistance  12 Steps:  3 - Partial/moderate assistance  Picking Up an Object:  50 - Not attempted due to medical condition or safety  concern      Comments: None    Signed by: [name], MSW 11/12/2019 3:24:00 PM    Physician CoSigned By: [name] 11/18/2019 19:07:09

## 2019-11-20 NOTE — Rehab PPS CMG ([facility]) (Addendum)
Corrected 11/21/2019 10:30:40 AM    NAME: Christy Mcintosh  MRN: [id number]  Account: [id number]  Session Start: 11/16/2019 12:00:00 AM  Session Stop: 11/16/2019 12:00:00 AM    Total Treatment Minutes:  Minutes    PPS CMG Coordinator  Inpatient Rehabilitation Discharge    Discharge Against Medical Advice:  No.  Discharge Information: Patient Discharged Alive:  Yes  Discharge Destination/Living Setting: Home.  At discharge, the patient was discharged to live (with) (02)  Family / Relatives        Impairment Group: Brain Dysfunction: 02.1 Non-traumatic    Comorbidities:  Rank Code      Description    1    E11.9     Type 2 diabetes mellitus without complications  2    E03.9     Hypothyroidism, unspecified  3    G40.909   Epilepsy, unspecified, not intractable, without                 status epilepticus  4    G82.20    Paraplegia, unspecified  5    J45.909   Unspecified asthma, uncomplicated  6    K21.9     Gastro-esophageal reflux disease without                 esophagitis  7    M79.7     Fibromyalgia  8    I10.      Essential (primary) hypertension  9    M51.16    Intervertebral disc disorders with                 radiculopathy, lumbar region  10   E78.00    Pure hypercholesterolemia, unspecified  11   D64.9     Anemia, unspecified  12   K58.0     Irritable bowel syndrome with diarrhea  13   K59.00    Constipation, unspecified  14   R82.81    Pyuria  15   F41.9     Anxiety disorder, unspecified  16   Z86.011   Personal history of benign neoplasm of the brain  17   Z90.49    Acquired absence of other specified parts of                 digestive tract    ********************************  Complications:  Rank Code      Description  1    R82.81    Pyuria    ********************************    MEDICAL NEEDS  Swallowing Status: Swallowing Status: Regular Food: solids and liquids swallowed  safely without supervision or modified food consistencies.    QUALITY INDICATORS  Section GG: Functional Abilities  Self Care Discharge  Performance                       Performance  GG0130. Self Care  Eating               6 - Independent  Oral hygiene         6 - Independent  Toileting hygiene    6 - Independent  Shower/bathe self    5 - Setup or clean-up assistance  Upper body dressing  6 - Independent  Lower body dressing  6 - Independent  Footwear on/off      6 - Independent    Mobility Discharge Performance  Performance  GG0170. Mobility  Roll left/right      6 - Independent  Sit to lying         6 - Independent  Lying to sitting bed 6 - Independent  Sit to stand         6 - Independent  Chair/bed transfer   6 - Independent  Toilet transfer      6 - Independent  Car transfer         6 - Independent  Walk 10 feet         6 - Independent  Walk 50 ft 2 turns   4 - Supervision or touching assistance  Walk 150 feet        4 - Supervision or touching assistance  10 ft uneven surface 4 - Supervision or touching assistance  1 step (curb)        4 - Supervision or touching assistance  4 steps              4 - Supervision or touching assistance  12 steps             4 - Supervision or touching assistance  Picking up object    6 - Independent  Use wheelchair?      No    Section M. Skin Conditions Discharge  Unhealed Pressure Ulcer(s)/Injurie(s) at Stage 1 or Higher:  No  Current Number of Unhealed Pressure Ulcers    Number of Unhealed Stage 1 Pressure Injuries: 0  Number of Unhealed Stage 2: 0  Number of Unhealed Stage 3: 0  Number of Unhealed Stage 4: 0  Number of Unhealed Unstageable Due to Non-removable Dressing/Device: 0  Number of Unhealed Unstageable Due to Slough/Eschar: 0  Number of Unhealed Unstageable Injuries Presenting as Deep Tissue Injury: 0  Worsening in Pressure Ulcer Status Since Admission    Number of Worsening Stage 2: 0  Number of Worsening Stage 3: 0  Number of Worsening Stage 4: 0  Number of Worsening Unstageable Due to Non-removable Dressing: 0  Number of Worsening Unstageable Due to Slough/Eschar: 0  Number of  Worsening Unstageable Due to Suspected Deep Tissue Injury: 0  Healed Pressure Ulcer(s)    Number of Healed Stage 1: 0  Number of Healed Stage 2: 0  Number of Healed Stage 3: 0  Number of Healed Stage 4: 0    Health Conditions: Fall(s) Since Admission:  No    Section N. Medication    Medication Intervention: Yes    Signed by: Tanda Rockers, PT, PPS Coordiantor 11/16/2019 4:00:00 PM

## 2021-02-22 ENCOUNTER — Other Ambulatory Visit: Payer: Self-pay

## 2021-02-22 ENCOUNTER — Emergency Department (HOSPITAL_BASED_OUTPATIENT_CLINIC_OR_DEPARTMENT_OTHER): Payer: Medicare Other

## 2021-02-22 ENCOUNTER — Encounter (HOSPITAL_BASED_OUTPATIENT_CLINIC_OR_DEPARTMENT_OTHER): Payer: Self-pay | Admitting: *Deleted

## 2021-02-22 ENCOUNTER — Emergency Department (HOSPITAL_BASED_OUTPATIENT_CLINIC_OR_DEPARTMENT_OTHER)
Admission: EM | Admit: 2021-02-22 | Discharge: 2021-02-22 | Disposition: A | Discharge status: Home / Self Care | Payer: Medicare Other | Attending: Emergency Medicine | Admitting: Emergency Medicine

## 2021-02-22 DIAGNOSIS — Z79899 Other long term (current) drug therapy: Secondary | ICD-10-CM | POA: Diagnosis not present

## 2021-02-22 DIAGNOSIS — R109 Unspecified abdominal pain: Secondary | ICD-10-CM

## 2021-02-22 DIAGNOSIS — R140 Abdominal distension (gaseous): Secondary | ICD-10-CM | POA: Insufficient documentation

## 2021-02-22 DIAGNOSIS — I10 Essential (primary) hypertension: Secondary | ICD-10-CM | POA: Insufficient documentation

## 2021-02-22 DIAGNOSIS — E119 Type 2 diabetes mellitus without complications: Secondary | ICD-10-CM | POA: Diagnosis not present

## 2021-02-22 DIAGNOSIS — M549 Dorsalgia, unspecified: Secondary | ICD-10-CM | POA: Insufficient documentation

## 2021-02-22 DIAGNOSIS — E039 Hypothyroidism, unspecified: Secondary | ICD-10-CM | POA: Insufficient documentation

## 2021-02-22 DIAGNOSIS — Z7901 Long term (current) use of anticoagulants: Secondary | ICD-10-CM | POA: Diagnosis not present

## 2021-02-22 DIAGNOSIS — R5383 Other fatigue: Secondary | ICD-10-CM | POA: Diagnosis not present

## 2021-02-22 HISTORY — DX: Gastro-esophageal reflux disease without esophagitis: K21.9

## 2021-02-22 HISTORY — DX: Irritable bowel syndrome, unspecified: K58.9

## 2021-02-22 HISTORY — DX: Type 2 diabetes mellitus without complications: E11.9

## 2021-02-22 HISTORY — DX: Anxiety disorder, unspecified: F41.9

## 2021-02-22 HISTORY — DX: Unspecified convulsions: R56.9

## 2021-02-22 HISTORY — DX: Essential (primary) hypertension: I10

## 2021-02-22 HISTORY — DX: Migraine, unspecified, not intractable, without status migrainosus: G43.909

## 2021-02-22 HISTORY — DX: Prothrombin gene mutation: D68.52

## 2021-02-22 HISTORY — DX: Depression, unspecified: F32.A

## 2021-02-22 HISTORY — DX: Hypothyroidism, unspecified: E03.9

## 2021-02-22 HISTORY — DX: Benign neoplasm of meninges, unspecified: D32.9

## 2021-02-22 LAB — URINALYSIS, ROUTINE W REFLEX MICROSCOPIC
Bilirubin Urine: NEGATIVE
Glucose, UA: NEGATIVE mg/dL
Hgb urine dipstick: NEGATIVE
Ketones, ur: NEGATIVE mg/dL
Nitrite: NEGATIVE
Protein, ur: NEGATIVE mg/dL
Specific Gravity, Urine: 1.026 (ref 1.005–1.030)
Trans Epithel, UA: <1
pH: 6 (ref 5.0–8.0)

## 2021-02-22 LAB — CBC WITH DIFFERENTIAL/PLATELET
Abs Immature Granulocytes: 0.01 10*3/uL (ref 0.00–0.07)
Basophils Absolute: 0 10*3/uL (ref 0.0–0.1)
Basophils Relative: 0 %
Eosinophils Absolute: 0.1 10*3/uL (ref 0.0–0.5)
Eosinophils Relative: 1 %
HCT: 33.5 % — ABNORMAL LOW (ref 36.0–46.0)
Hemoglobin: 11.4 g/dL — ABNORMAL LOW (ref 12.0–15.0)
Immature Granulocytes: 0 %
Lymphocytes Relative: 28 %
Lymphs Abs: 1.5 10*3/uL (ref 0.7–4.0)
MCH: 31 pg (ref 26.0–34.0)
MCHC: 34 g/dL (ref 30.0–36.0)
MCV: 91 fL (ref 80.0–100.0)
Monocytes Absolute: 0.6 10*3/uL (ref 0.1–1.0)
Monocytes Relative: 10 %
Neutro Abs: 3.3 10*3/uL (ref 1.7–7.7)
Neutrophils Relative %: 61 %
Platelets: 276 10*3/uL (ref 150–400)
RBC: 3.68 MIL/uL — ABNORMAL LOW (ref 3.87–5.11)
RDW: 12.7 % (ref 11.5–15.5)
WBC: 5.5 10*3/uL (ref 4.0–10.5)
nRBC: 0 % (ref 0.0–0.2)

## 2021-02-22 LAB — BASIC METABOLIC PANEL
Anion gap: 7 (ref 5–15)
BUN: 27 mg/dL — ABNORMAL HIGH (ref 6–20)
CO2: 25 mmol/L (ref 22–32)
Calcium: 9.6 mg/dL (ref 8.9–10.3)
Chloride: 107 mmol/L (ref 98–111)
Creatinine, Ser: 0.72 mg/dL (ref 0.44–1.00)
GFR, Estimated: >60 mL/min (ref >60)
Glucose, Bld: 101 mg/dL — ABNORMAL HIGH (ref 70–99)
Potassium: 3.6 mmol/L (ref 3.5–5.1)
Sodium: 139 mmol/L (ref 135–145)

## 2021-02-22 NOTE — ED Provider Notes (Signed)
[facility] EMERGENCY DEPT Provider Note   CSN: [id number] Arrival date & time: 02/22/21  0510     History Chief Complaint  Patient presents with   Other    Uti symptoms     Shelley Kim is a 60 y.o. female.  HPI     This is a 60 year old female with a history of diabetes, hypertension, IBS, colectomy, neurogenic bladder who presents with concerns for UTI.  Patient reports that she is currently finishing a round of antibiotics for urinary tract infection.  She is on day 10 of cefdinir.  She reports she generally just does not feel well and she has been fatigued.  She believes she still has a urinary tract infection.  She reports right greater than left back pain and abdominal bloating.  She does not get typical dysuria or hematuria.  She is not had any nausea or vomiting.  She reports loose stools.  No known history of kidney stones.  She has not had any fevers.  Past Medical History:  Diagnosis Date   Anxiety    Depression    Diabetes ([place])    GERD (gastroesophageal reflux disease)    Hypertension    Hypothyroid    IBS (irritable bowel syndrome)    Meningioma ([facility])    Migraine    Prothrombin gene mutation ([place])    Seizures ([place])     There are no problems to display for this patient.   Past Surgical History:  Procedure Laterality Date   COLECTOMY     CRANIOTOMY     KNEE SURGERY     ROTATOR CUFF REPAIR     TONSILLECTOMY       OB History   No obstetric history on file.     No family history on file.  Social History   Tobacco Use   Smoking status: Never   Smokeless tobacco: Never  Substance Use Topics   Alcohol use: Never   Drug use: Never    Home Medications Prior to Admission medications   Medication Sig Start Date End Date Taking? Authorizing Provider  amLODipine (NORVASC) 10 MG tablet Take 10 mg by mouth daily.   Yes [provider]  apixaban (ELIQUIS) 5 MG TABS tablet Take 5 mg by mouth 2 (two) times daily.   Yes  [provider]  ezetimibe (ZETIA) 10 MG tablet Take 10 mg by mouth daily.   Yes [provider]  ferrous sulfate 325 (65 FE) MG tablet Take 325 mg by mouth daily with breakfast.   Yes [provider]  fluticasone-salmeterol (ADVAIR) 100-50 MCG/ACT AEPB Inhale 1 puff into the lungs 2 (two) times daily.   Yes [provider]  lacosamide (VIMPAT) 50 MG TABS tablet Take 50 mg by mouth 2 (two) times daily.   Yes [provider]  levocetirizine (XYZAL) 5 MG tablet Take 5 mg by mouth every evening.   Yes [provider]  levothyroxine (SYNTHROID) 50 MCG tablet Take 50 mcg by mouth daily before breakfast.   Yes [provider]  linaCLOtide (LINZESS PO) Take by mouth.   Yes [provider]  montelukast (SINGULAIR) 10 MG tablet Take 10 mg by mouth at bedtime.   Yes [provider]  omeprazole (PRILOSEC) 40 MG capsule Take 40 mg by mouth daily.   Yes [provider]  propranolol (INDERAL) 80 MG tablet Take 80 mg by mouth 3 (three) times daily.   Yes [provider]  rosuvastatin (CRESTOR) 40 MG tablet Take  40 mg by mouth daily.   Yes [provider]  albuterol (PROVENTIL) (2.5 MG/3ML) 0.083% nebulizer solution Take 2.5 mg by nebulization every 6 (six) hours as needed for shortness of breath or wheezing. 09/20/16   [provider]  alprazolam Duanne Moron) 2 MG tablet Take 2 mg by mouth every 8 (eight) hours as needed for anxiety. 09/20/16   [provider]  amphetamine-dextroamphetamine (ADDERALL) 20 MG tablet Take 20 mg by mouth 2 (two) times daily. 09/20/16   [provider]  citalopram (CELEXA) 40 MG tablet Take 40 mg by mouth daily. 07/07/16   [provider]  clobetasol ointment (TEMOVATE) 9.83 % Apply 1 application topically daily as needed. 09/20/16   [provider]  DIOVAN 320 MG tablet Take 320 mg by mouth daily. 09/20/16   [provider]   gabapentin (NEURONTIN) 100 MG capsule Take 100 mg by mouth 3 (three) times daily as needed for pain. 09/20/16   [provider]  HM CLEARLAX powder Take 17 g by mouth daily. 09/20/16   [provider]  HYDROQUINONE TIME RELEASE 4 % CREA Apply 1 application topically daily. 09/20/16   [provider]  Lacosamide (VIMPAT) 150 MG TABS Take 150 mg by mouth 2 (two) times daily.    [provider]  LANTUS SOLOSTAR 100 UNIT/ML Solostar Pen Inject 10-48 Units into the skin 2 (two) times daily. Inject 10 units in the morning, and 48 in the evening 08/11/16   [provider]  NOVOLOG FLEXPEN 100 UNIT/ML FlexPen Inject 6-10 Units into the skin 3 (three) times daily. Sliding scale 09/20/16   [provider]  ondansetron (ZOFRAN-ODT) 8 MG disintegrating tablet Take 8 mg by mouth every 8 (eight) hours as needed for nausea/vomiting. 08/16/16   [provider]  oxyCODONE (ROXICODONE) 15 MG immediate release tablet Take 15 mg by mouth every 6 (six) hours as needed for pain. 09/20/16   [provider]  PERI-COLACE 8.6-50 MG tablet Take 1 tablet by mouth daily. 07/07/16   [provider]  Prenatal Vit-Fe Fumarate-FA (PRENATAL VITAMIN PLUS LOW IRON) 27-1 MG TABS Take 1 tablet by mouth daily. 09/20/16   [provider]  PROAIR HFA 108 (90 Base) MCG/ACT inhaler Take 2 puffs by mouth every 6 (six) hours as needed for shortness of breath or wheezing. 08/11/16   [provider]  REPATHA SURECLICK 382 MG/ML SOAJ Inject 140 mg into the skin every 14 (fourteen) days. 09/20/16   [provider]  RETIN-A 0.05 % cream Apply 1 application topically daily. 09/20/16   [provider]  SEROQUEL XR 400 MG 24 hr tablet Take 400 mg by mouth at bedtime. 07/07/16   [provider]  terconazole (TERAZOL 3) 0.8 % vaginal cream Place 1 applicator vaginally every 7 (seven) days. 09/20/16   [provider]  traZODone  (DESYREL) 100 MG tablet Take 150 mg by mouth at bedtime as needed for sleep. 07/07/16   [provider]  triamcinolone (KENALOG) 0.025 % cream Apply 1 application topically daily. 09/20/16   [provider]  Vitamin D, Ergocalciferol, (DRISDOL) 50000 units CAPS capsule Take 50,000 Units by mouth 2 (two) times a week. 09/20/16   [provider]  zonisamide (ZONEGRAN) 100 MG capsule Take 200 mg by mouth 2 (two) times daily. Take with 50mg  capsule to equal 250mg     [provider]  zonisamide (ZONEGRAN) 50 MG capsule Take 50 mg by mouth daily. Take with 200mg  capsule to equal 250mg   [provider]    Allergies    Percocet [oxycodone-acetaminophen], Ambien [zolpidem], Cubicin [daptomycin], Levaquin [levofloxacin], Morphine and related, Sulfa antibiotics, Vancomycin, and Adhesive [tape]  Review of Systems   Review of Systems  Constitutional:  Positive for fatigue. Negative for fever.  Respiratory:  Negative for shortness of breath.   Cardiovascular:  Negative for chest pain.  Gastrointestinal:  Negative for abdominal pain, nausea and vomiting.  Genitourinary:  Positive for flank pain. Negative for dysuria and menstrual problem.  All other systems reviewed and are negative.  Physical Exam Updated Vital Signs BP (!) 132/91 (BP Location: Right Arm)   Pulse 78   Temp 98.1 F (36.7 C) (Oral)   Resp 15   Ht 1.6 m (5\' 3" )   Wt 68.5 kg   SpO2 100%   BMI 26.75 kg/m   Physical Exam Vitals and nursing note reviewed.  Constitutional:      Appearance: She is well-developed. She is not ill-appearing.  HENT:     Head: Normocephalic.     Comments: Scarring noted anterior scab    Nose: Nose normal.     Mouth/Throat:     Mouth: Mucous membranes are moist.  Eyes:     Pupils: Pupils are equal, round, and reactive to light.  Cardiovascular:     Rate and Rhythm: Normal rate and regular rhythm.     Heart sounds: Normal heart sounds.  Pulmonary:      Effort: Pulmonary effort is normal. No respiratory distress.     Breath sounds: No wheezing.  Abdominal:     General: Bowel sounds are normal.     Palpations: Abdomen is soft.     Tenderness: There is no abdominal tenderness. There is no right CVA tenderness or left CVA tenderness.  Musculoskeletal:     Cervical back: Neck supple.     Right lower leg: No edema.     Left lower leg: No edema.  Skin:    General: Skin is warm and dry.  Neurological:     Mental Status: She is alert and oriented to person, place, and time.  Psychiatric:        Mood and Affect: Mood normal.    ED Results / Procedures / Treatments   Labs (all labs ordered are listed, but only abnormal results are displayed) Labs Reviewed  URINALYSIS, ROUTINE W REFLEX MICROSCOPIC - Abnormal; Notable for the following components:      Result Value   Leukocytes,Ua MODERATE (*)    Bacteria, UA RARE (*)    All other components within normal limits  CBC WITH DIFFERENTIAL/PLATELET - Abnormal; Notable for the following components:   RBC 3.68 (*)    Hemoglobin 11.4 (*)    HCT 33.5 (*)    All other components within normal limits  BASIC METABOLIC PANEL - Abnormal; Notable for the following components:   Glucose, Bld 101 (*)    BUN 27 (*)    All other components within normal limits  URINE CULTURE    EKG None  Radiology CT Renal Stone Study  Result Date: 02/22/2021 CLINICAL DATA:  60 year old female with low back pain and pressure. Flank pain. Currently on a course of antibiotics for UTI. EXAM: CT ABDOMEN AND PELVIS WITHOUT CONTRAST TECHNIQUE: Multidetector CT imaging of the abdomen and pelvis was performed following the standard protocol without IV contrast. COMPARISON:  None. FINDINGS: Lower chest: Cardiac size at the upper limits of normal. No pericardial or pleural effusion. Negative lung bases. Hepatobiliary: Negative noncontrast liver. Diminutive  or absent gallbladder. Pancreas: Negative. Spleen: Negative.  Adrenals/Urinary Tract: Normal adrenal glands. Kidneys appear symmetric without nephrolithiasis. No convincing hydronephrosis. No perinephric stranding. Both ureters are decompressed throughout the abdomen. The distal ureters are difficult to delineate, but there are no suspicious pelvic calcifications (numerous pelvic phleboliths are noted. Unremarkable urinary bladder. No bladder wall thickening or perivesical stranding. Stomach/Bowel: Previous subtotal colectomy with a distal small to large bowel anastomosis in the pelvis. No dilated loops or adverse features. Stomach and duodenum appear negative. No free air or free fluid. No mesenteric stranding. Vascular/Lymphatic: Normal caliber abdominal aorta. Minimal calcified atherosclerosis. No lymphadenopathy identified. Reproductive: Surgically absent uterus. Diminutive or absent ovaries. Other: No pelvic free fluid.  Numerous pelvic phleboliths. Musculoskeletal: No acute osseous abnormality identified. Exaggerated lower lumbar lordosis with moderate to severe lower lumbar facet degeneration. Capacious spinal canal. IMPRESSION: 1. No acute or inflammatory process identified in the non-contrast abdomen or pelvis. No urinary calculus or obstructive uropathy. 2. 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 Electronically Signed   By: Genevie Ann M.D.   On: 02/22/2021 07:06    Procedures Procedures   Medications Ordered in ED Medications - No data to display  ED Course  I have reviewed the triage vital signs and the nursing notes.  Pertinent labs & imaging results that were available during my care of the patient were reviewed by me and considered in my medical decision making (see chart for details).    MDM Rules/Calculators/A&P                          Patient presents with generalized fatigue and back and flank pain.  She has a history of UTIs and neurogenic bladder.  She is currently undergoing treatment.  She is afebrile and nontoxic.  She is  concerned she has recurrent UTI.  Her exam is fairly benign.  She does not appear septic.  Urinalysis obtained.  She has just a few white cells and rare bacteria.  Culture was sent.  Do not feel this is consistent with an acute UTI.  Other consideration would be kidney stone.  CT stone study was obtained and does not show any evidence of stone.  Lab work is largely reassuring including no significant metabolic derangements or leukocytosis.  Patient was reassured.  Recommend that she finish her course of antibiotics and await culture results.  She stated understanding.  After history, exam, and medical workup I feel the patient has been appropriately medically screened and is safe for discharge home. Pertinent diagnoses were discussed with the patient. Patient was given return precautions.    Final Clinical Impression(s) / ED Diagnoses Final diagnoses:  Flank pain    Rx / DC Orders ED Discharge Orders     None        Geral Coker, Barbette Hair, MD 02/23/21 [phone]

## 2021-02-22 NOTE — Discharge Instructions (Addendum)
YOu were seen today for back pain and concerns for UTI.  Your urine looks reassuring.  Culture is pending.  CT scan does not show a kidney stone.

## 2021-02-22 NOTE — ED Triage Notes (Signed)
Pt c/o lower back pain and pressure. States she has recently been treated for a UTI and has one day left on a 10 day course of antibiotics (cefdinir ) denies any fevers. C/o fatigue. Also received rocephin in the ER. Pt is from out of town. C/o of headaches as well.

## 2021-02-23 LAB — URINE CULTURE: Culture: <10000 — AB

## 2021-03-03 ENCOUNTER — Telehealth (HOSPITAL_COMMUNITY): Payer: Self-pay

## 2022-12-18 IMAGING — CT CT RENAL STONE PROTOCOL
2 of 4 series · 16 of 46 positions shown, 18 images · non-contrast
Comparison: None.

CLINICAL DATA: 59-year-old female with low back pain and pressure.
Flank pain. Currently on a course of antibiotics for UTI.

EXAM:
CT ABDOMEN AND PELVIS WITHOUT CONTRAST
TECHNIQUE: Multidetector CT imaging of the abdomen and pelvis was performed
following the standard protocol without IV contrast.

[Series 2: stone full · axial · 0.65mm/px · z∈[+904,+1259]mm · 13 of 79 slices shown, 15 images]
[im 4/79  soft-tissue]
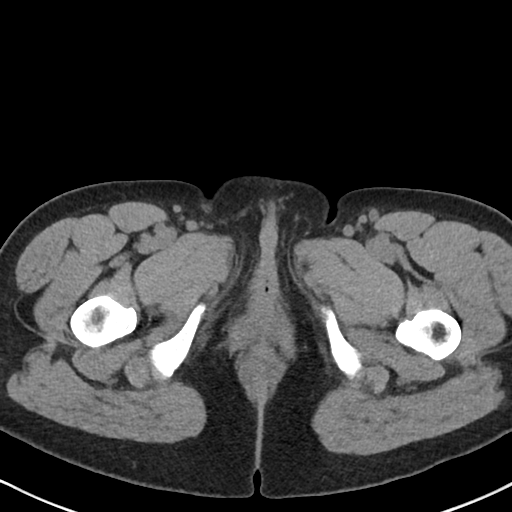
[im 4/79  bone]
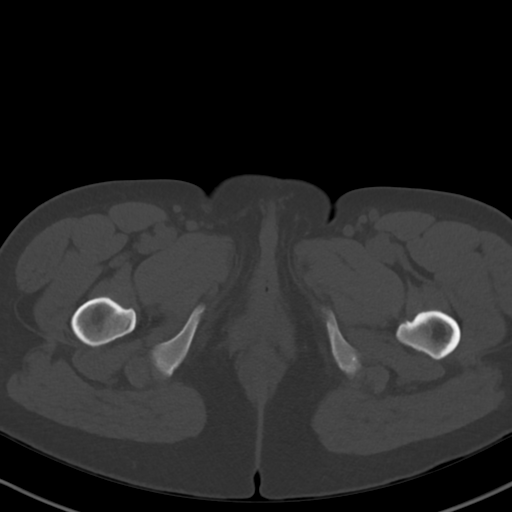
[im 10/79  soft-tissue]
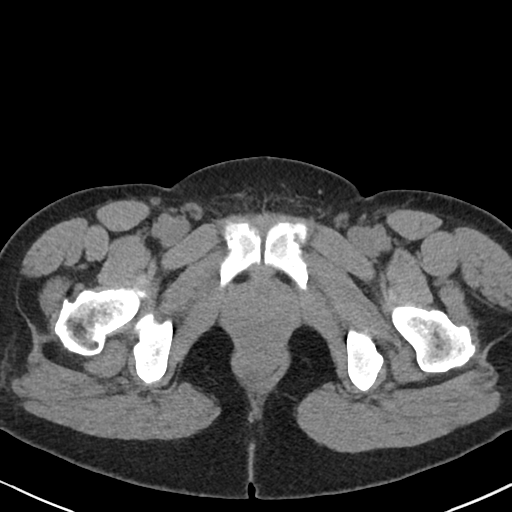
[im 16/79  soft-tissue]
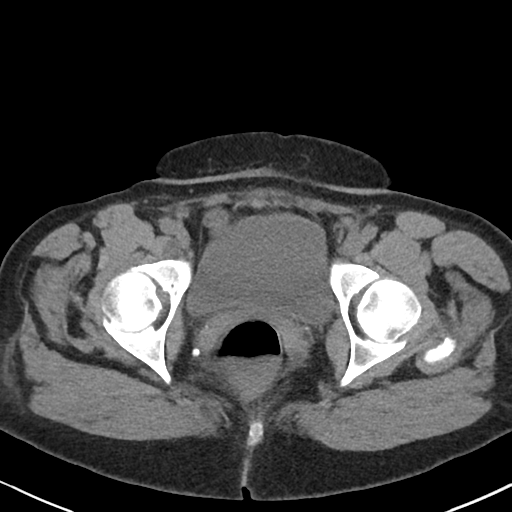
[im 22/79  soft-tissue]
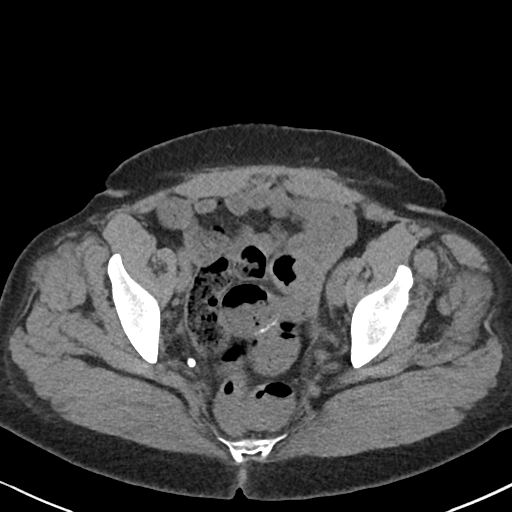
[im 29/79  soft-tissue]
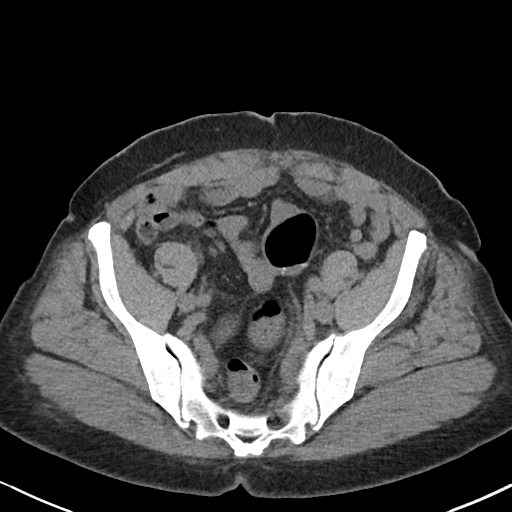
[im 35/79  soft-tissue]
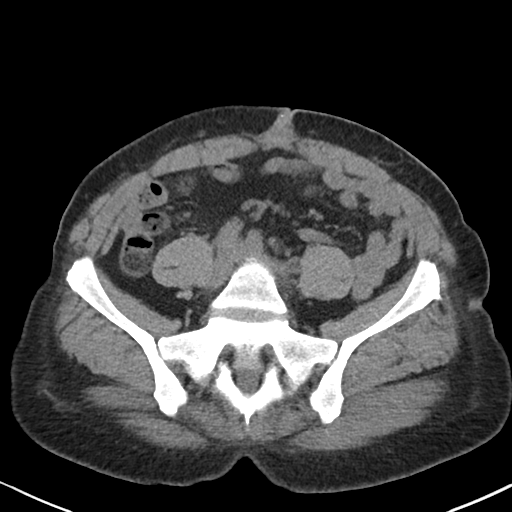
[im 41/79  soft-tissue]
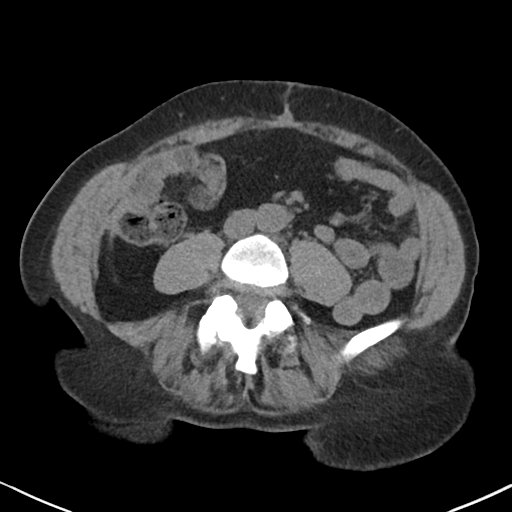
[im 44/79  soft-tissue]
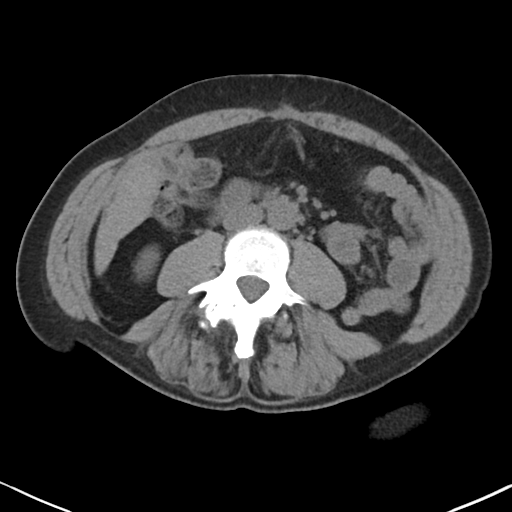
[im 50/79  soft-tissue]
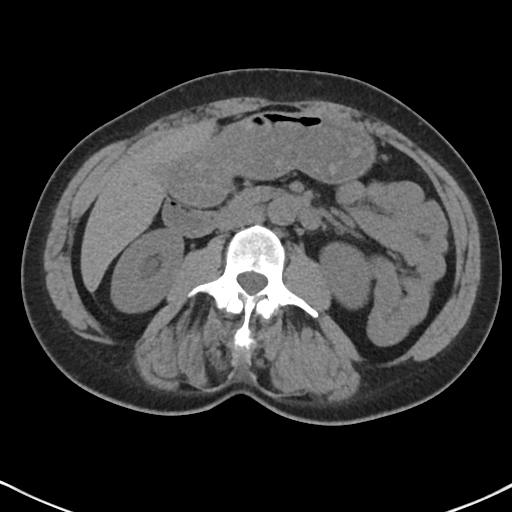
[im 50/79  bone]
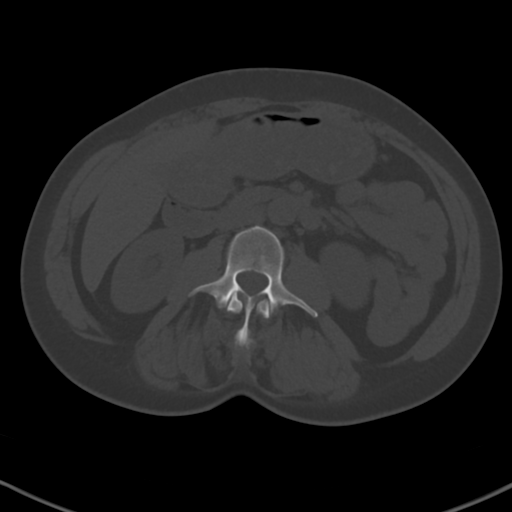
[im 57/79  soft-tissue]
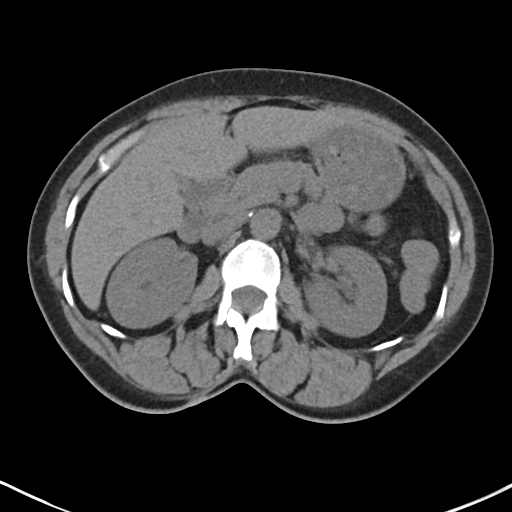
[im 63/79  soft-tissue]
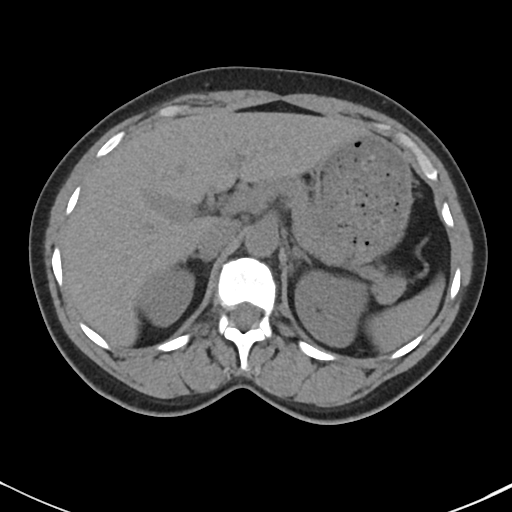
[im 69/79  soft-tissue]
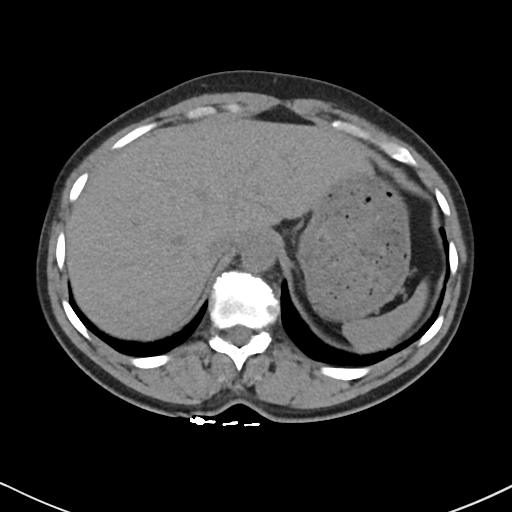
[im 75/79  soft-tissue]
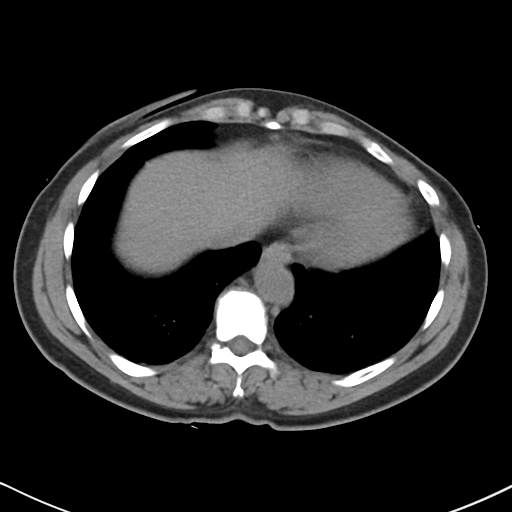

[Series 6: coronal · coronal · 0.77mm/px · 3 of 100 slices shown]
[im 34/100  soft-tissue]
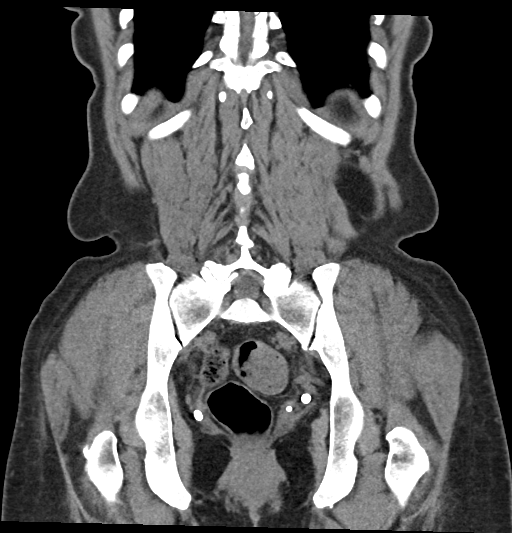
[im 45/100  soft-tissue]
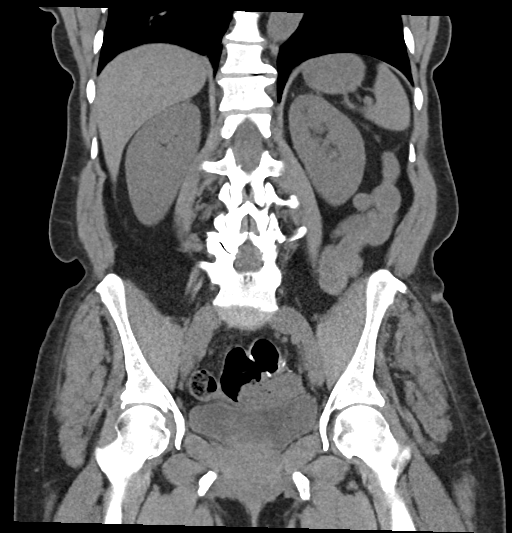
[im 56/100  soft-tissue]
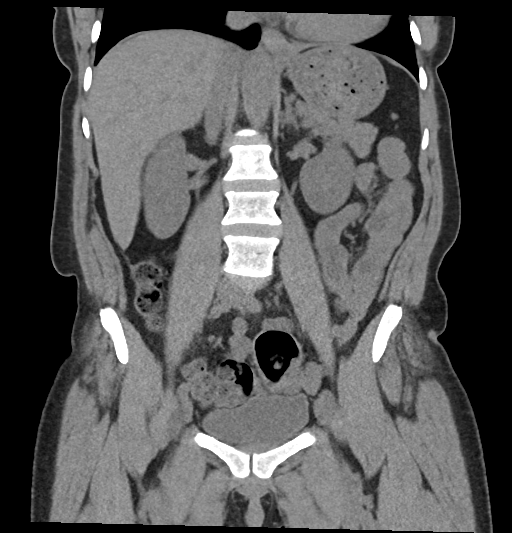

[16 of 46 positions shown; findings below may reference images not displayed]

FINDINGS: Lower chest: Cardiac size at the upper limits of normal. No
pericardial or pleural effusion. Negative lung bases.

Hepatobiliary: Negative noncontrast liver. Diminutive or absent
gallbladder.

Pancreas: Negative.

Spleen: Negative.

Adrenals/Urinary Tract: Normal adrenal glands.

Kidneys appear symmetric without nephrolithiasis. No convincing
hydronephrosis. No perinephric stranding. Both ureters are
decompressed throughout the abdomen. The distal ureters are
difficult to delineate, but there are no suspicious pelvic
calcifications (numerous pelvic phleboliths are noted.

Unremarkable urinary bladder. No bladder wall thickening or
perivesical stranding.

Stomach/Bowel: Previous subtotal colectomy with a distal small to
large bowel anastomosis in the pelvis. No dilated loops or adverse
features. Stomach and duodenum appear negative. No free air or free
fluid. No mesenteric stranding.

Vascular/Lymphatic: Normal caliber abdominal aorta. Minimal
calcified atherosclerosis. No lymphadenopathy identified.

Reproductive: Surgically absent uterus. Diminutive or absent
ovaries.

Other: No pelvic free fluid.  Numerous pelvic phleboliths.

Musculoskeletal: No acute osseous abnormality identified.
Exaggerated lower lumbar lordosis with moderate to severe lower
lumbar facet degeneration. Capacious spinal canal.
IMPRESSION: 1. No acute or inflammatory process identified in the non-contrast
abdomen or pelvis. No urinary calculus or obstructive uropathy.
2. Previous subtotal colectomy with no adverse features.

## 2023-07-05 ENCOUNTER — Other Ambulatory Visit: Payer: Self-pay

## 2023-07-05 ENCOUNTER — Emergency Department (HOSPITAL_COMMUNITY)
Admission: EM | Admit: 2023-07-05 | Discharge: 2023-07-05 | Disposition: A | Discharge status: Home / Self Care | Payer: Medicare Other | Attending: Emergency Medicine | Admitting: Emergency Medicine

## 2023-07-05 ENCOUNTER — Emergency Department (HOSPITAL_COMMUNITY): Payer: Medicare Other

## 2023-07-05 DIAGNOSIS — Z7901 Long term (current) use of anticoagulants: Secondary | ICD-10-CM | POA: Insufficient documentation

## 2023-07-05 DIAGNOSIS — R1012 Left upper quadrant pain: Secondary | ICD-10-CM | POA: Insufficient documentation

## 2023-07-05 LAB — COMPREHENSIVE METABOLIC PANEL
ALT: 21 U/L (ref 0–44)
AST: 19 U/L (ref 15–41)
Albumin: 3.8 g/dL (ref 3.5–5.0)
Alkaline Phosphatase: 63 U/L (ref 38–126)
Anion gap: 6 (ref 5–15)
BUN: 15 mg/dL (ref 6–20)
CO2: 27 mmol/L (ref 22–32)
Calcium: 9.3 mg/dL (ref 8.9–10.3)
Chloride: 105 mmol/L (ref 98–111)
Creatinine, Ser: 0.62 mg/dL (ref 0.44–1.00)
GFR, Estimated: >60 mL/min (ref >60)
Glucose, Bld: 104 mg/dL — ABNORMAL HIGH (ref 70–99)
Potassium: 3.5 mmol/L (ref 3.5–5.1)
Sodium: 138 mmol/L (ref 135–145)
Total Bilirubin: 0.5 mg/dL (ref <1.2)
Total Protein: 6.5 g/dL (ref 6.5–8.1)

## 2023-07-05 LAB — CBC
HCT: 34.6 % — ABNORMAL LOW (ref 36.0–46.0)
Hemoglobin: 11.5 g/dL — ABNORMAL LOW (ref 12.0–15.0)
MCH: 30.7 pg (ref 26.0–34.0)
MCHC: 33.2 g/dL (ref 30.0–36.0)
MCV: 92.5 fL (ref 80.0–100.0)
Platelets: 305 10*3/uL (ref 150–400)
RBC: 3.74 MIL/uL — ABNORMAL LOW (ref 3.87–5.11)
RDW: 13.4 % (ref 11.5–15.5)
WBC: 4.2 10*3/uL (ref 4.0–10.5)
nRBC: 0 % (ref 0.0–0.2)

## 2023-07-05 LAB — URINALYSIS, ROUTINE W REFLEX MICROSCOPIC
Bilirubin Urine: NEGATIVE
Glucose, UA: NEGATIVE mg/dL
Hgb urine dipstick: NEGATIVE
Ketones, ur: NEGATIVE mg/dL
Nitrite: NEGATIVE
Protein, ur: NEGATIVE mg/dL
Specific Gravity, Urine: 1.02 (ref 1.005–1.030)
pH: 6 (ref 5.0–8.0)

## 2023-07-05 LAB — LIPASE, BLOOD: Lipase: 27 U/L (ref 11–51)

## 2023-07-05 MED ORDER — IOPAMIDOL (ISOVUE-370) INJECTION 76%
75.0000 mL | Freq: Once | INTRAVENOUS | Status: AC | PRN
  Administered 2023-07-05: 75 mL via INTRAVENOUS

## 2023-07-05 MED ORDER — ONDANSETRON HCL 4 MG/2ML IJ SOLN
4.0000 mg | Freq: Once | INTRAMUSCULAR | Status: AC
Start: 2023-07-05 — End: 2023-07-05
  Administered 2023-07-05: 4 mg via INTRAVENOUS
  Filled 2023-07-05: qty 2

## 2023-07-05 MED ORDER — HYDROMORPHONE HCL 2 MG PO TABS
1.0000 mg | ORAL_TABLET | Freq: Once | ORAL | Status: AC
  Administered 2023-07-05: 1 mg via ORAL
  Filled 2023-07-05: qty 1

## 2023-07-05 MED ORDER — HYDROMORPHONE HCL 1 MG/ML IJ SOLN
0.5000 mg | Freq: Once | INTRAMUSCULAR | Status: AC
  Administered 2023-07-05: 0.5 mg via INTRAVENOUS
  Filled 2023-07-05: qty 1

## 2023-07-05 MED ORDER — ONDANSETRON HCL 4 MG PO TABS: 4.0000 mg | ORAL_TABLET | Freq: Four times a day (QID) | ORAL | 0 refills | Status: AC | PRN

## 2023-07-05 MED ORDER — LACTATED RINGERS IV BOLUS
1000.0000 mL | Freq: Once | INTRAVENOUS | Status: AC
  Administered 2023-07-05: 1000 mL via INTRAVENOUS

## 2023-07-05 NOTE — ED Triage Notes (Signed)
Pt arrives to ED c/o left sided abdominal that radiates to left flank. Pt reports that she was recently dx with UTI and has finished course of ABX. Pt reports pain is still present. Pt endorses frequent urination, denies dysuria and N/V.

## 2023-07-05 NOTE — ED Provider Notes (Signed)
Care of patient received from prior provider at 9:55 AM, please see their note for complete H/P and care plan. (Full Evaluation by Dr. Clayborne Dana)  Received handoff per ED course.  Clinical Course as of 07/05/23 0955  Wed Jul 05, 2023  0701 Stable 61 YOF with colectomy/diversion. Complicated by infection. Has recurrent UTIS from prolonged hospitalizations. On Augmentin. Left sided AP. CTAP pending. Recent MRI for 6mm CBD  [CC]  0906 CT scan positive for acute left upper quadrant gastroenteritis. Will continue with supportive care.  No evidence of severe electrolyte abnormality nor critical infection on my exam.  Will treat with supportive care plan for follow-up with PCP in outpatient setting. [CC]    Clinical Course User Index [CC] Glyn Ade, MD   Disposition:  I have considered need for hospitalization, however, considering all of the above, I believe this patient is stable for discharge at this time.  Patient/family educated about specific return precautions for given chief complaint and symptoms.  Patient/family educated about follow-up with PCP.     Patient/family expressed understanding of return precautions and need for follow-up. Patient spoken to regarding all imaging and laboratory results and appropriate follow up for these results. All education provided in verbal form with additional information in written form. Time was allowed for answering of patient questions. Patient discharged.    Emergency Department Medication Summary:   Medications  HYDROmorphone (DILAUDID) tablet 1 mg (has no administration in time range)  HYDROmorphone (DILAUDID) injection 0.5 mg (0.5 mg Intravenous Given 07/05/23 0709)  lactated ringers bolus 1,000 mL (1,000 mLs Intravenous New Bag/Given 07/05/23 0709)  ondansetron (ZOFRAN) injection 4 mg (4 mg Intravenous Given 07/05/23 0738)  iopamidol (ISOVUE-370) 76 % injection 75 mL (75 mLs Intravenous Contrast Given 07/05/23 0736)            Glyn Ade, MD 07/05/23 [id number]

## 2023-07-19 NOTE — ED Provider Notes (Signed)
[place] EMERGENCY DEPARTMENT AT [facility] Provider Note   CSN: [id number] Arrival date & time: 07/05/23  0441     History  Chief Complaint  Patient presents with   Abdominal Pain    Shelley Kim is a 62 y.o. female.  Patient with complicated surgical historyand known dilate pancreatic duct here with abdominal pain and flank pain. Previously dx w/ UTI and took antibiotics, still with some UTI symptoms likely increased frequency. Denies other symptoms. 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    Abdominal Pain Associated symptoms: no chest pain, no chills, no cough, no dysuria, no fever, no hematuria, no shortness of breath, no sore throat and no vomiting        Home Medications Prior to Admission medications   Medication Sig Start Date End Date Taking? Authorizing Provider  ondansetron (ZOFRAN) 4 MG tablet Take 1 tablet (4 mg total) by mouth every 6 (six) hours as needed for up to 30 doses for nausea or vomiting. 07/05/23  Yes Glyn Ade, MD  albuterol (PROVENTIL) (2.5 MG/3ML) 0.083% nebulizer solution Take 2.5 mg by nebulization every 6 (six) hours as needed for shortness of breath or wheezing. 09/20/16   [provider]  alprazolam Prudy Feeler) 2 MG tablet Take 2 mg by mouth every 8 (eight) hours as needed for anxiety. 09/20/16   [provider]  amLODipine (NORVASC) 10 MG tablet Take 10 mg by mouth daily.    [provider]  amphetamine-dextroamphetamine (ADDERALL) 20 MG tablet Take 20 mg by mouth 2 (two) times daily. 09/20/16   [provider]  apixaban (ELIQUIS) 5 MG TABS tablet Take 5 mg by mouth 2 (two) times daily.    [provider]  citalopram (CELEXA) 40 MG tablet Take 40 mg by mouth daily. 07/07/16   [provider]  clobetasol ointment (TEMOVATE) 0.05 % Apply 1 application topically daily as needed. 09/20/16   [provider]   DIOVAN 320 MG tablet Take 320 mg by mouth daily. 09/20/16   [provider]  ezetimibe (ZETIA) 10 MG tablet Take 10 mg by mouth daily.    [provider]  ferrous sulfate 325 (65 FE) MG tablet Take 325 mg by mouth daily with breakfast.    [provider]  fluticasone-salmeterol (ADVAIR) 100-50 MCG/ACT AEPB Inhale 1 puff into the lungs 2 (two) times daily.    [provider]  gabapentin (NEURONTIN) 100 MG capsule Take 100 mg by mouth 3 (three) times daily as needed for pain. 09/20/16   [provider]  HM CLEARLAX powder Take 17 g by mouth daily. 09/20/16   [provider]  HYDROQUINONE TIME RELEASE 4 % CREA Apply 1 application topically daily. 09/20/16   [provider]  Lacosamide (VIMPAT) 150 MG TABS Take 150 mg by mouth 2 (two) times daily.    [provider]  lacosamide (VIMPAT) 50 MG TABS tablet Take 50 mg by mouth 2 (two) times daily.    [provider]  LANTUS SOLOSTAR 100 UNIT/ML Solostar Pen Inject 10-48 Units into the skin 2 (two) times daily. Inject 10 units in the morning, and 48 in the evening 08/11/16   [provider]  levocetirizine (XYZAL) 5 MG tablet Take 5 mg by mouth every evening.    [provider]  levothyroxine (SYNTHROID) 50 MCG tablet Take 50 mcg by mouth daily before breakfast.    [provider]  linaCLOtide (LINZESS PO) Take  by mouth.    [provider]  montelukast (SINGULAIR) 10 MG tablet Take 10 mg by mouth at bedtime.    [provider]  NOVOLOG FLEXPEN 100 UNIT/ML FlexPen Inject 6-10 Units into the skin 3 (three) times daily. Sliding scale 09/20/16   [provider]  omeprazole (PRILOSEC) 40 MG capsule Take 40 mg by mouth daily.    [provider]  ondansetron (ZOFRAN-ODT) 8 MG disintegrating tablet Take 8 mg by mouth every 8 (eight) hours as needed for nausea/vomiting. 08/16/16   [provider]  oxyCODONE  (ROXICODONE) 15 MG immediate release tablet Take 15 mg by mouth every 6 (six) hours as needed for pain. 09/20/16   [provider]  PERI-COLACE 8.6-50 MG tablet Take 1 tablet by mouth daily. 07/07/16   [provider]  Prenatal Vit-Fe Fumarate-FA (PRENATAL VITAMIN PLUS LOW IRON) 27-1 MG TABS Take 1 tablet by mouth daily. 09/20/16   [provider]  PROAIR HFA 108 (90 Base) MCG/ACT inhaler Take 2 puffs by mouth every 6 (six) hours as needed for shortness of breath or wheezing. 08/11/16   [provider]  propranolol (INDERAL) 80 MG tablet Take 80 mg by mouth 3 (three) times daily.    [provider]  REPATHA SURECLICK 140 MG/ML SOAJ Inject 140 mg into the skin every 14 (fourteen) days. 09/20/16   [provider]  RETIN-A 0.05 % cream Apply 1 application topically daily. 09/20/16   [provider]  rosuvastatin (CRESTOR) 40 MG tablet Take 40 mg by mouth daily.    [provider]  SEROQUEL XR 400 MG 24 hr tablet Take 400 mg by mouth at bedtime. 07/07/16   [provider]  terconazole (TERAZOL 3) 0.8 % vaginal cream Place 1 applicator vaginally every 7 (seven) days. 09/20/16   [provider]  traZODone (DESYREL) 100 MG tablet Take 150 mg by mouth at bedtime as needed for sleep. 07/07/16   [provider]  triamcinolone (KENALOG) 0.025 % cream Apply 1 application topically daily. 09/20/16   [provider]  Vitamin D, Ergocalciferol, (DRISDOL) 50000 units CAPS capsule Take 50,000 Units by mouth 2 (two) times a week. 09/20/16   [provider]  zonisamide (ZONEGRAN) 100 MG capsule Take 200 mg by mouth 2 (two) times daily. Take with 50mg  capsule to equal 250mg     [provider]  zonisamide (ZONEGRAN) 50 MG capsule Take 50 mg by mouth daily. Take with 200mg  capsule to equal 250mg     [provider]      Allergies    Percocet [oxycodone-acetaminophen], Ambien [zolpidem], Cubicin  [daptomycin], Levaquin [levofloxacin], Morphine and codeine, Sulfa antibiotics, Vancomycin, and Adhesive [tape]    Review of Systems   Review of Systems  Constitutional:  Negative for chills and fever.  HENT:  Negative for ear pain and sore throat.   Eyes:  Negative for pain and visual disturbance.  Respiratory:  Negative for cough and shortness of breath.   Cardiovascular:  Negative for chest pain and palpitations.  Gastrointestinal:  Positive for abdominal pain. Negative for vomiting.  Genitourinary:  Negative for dysuria and hematuria.  Musculoskeletal:  Negative for arthralgias and back pain.  Skin:  Negative for color change and rash.  Neurological:  Negative for seizures and syncope.  All other systems reviewed and are negative.   Physical Exam Updated Vital Signs BP 139/87   Pulse 65   Temp (!) 97.1 F (36.2 C) (Oral)   Resp 18   Ht  5\' 3"  (1.6 m)   Wt 59 kg   SpO2 100%   BMI 23.03 kg/m  Physical Exam Vitals and nursing note reviewed.  Constitutional:      Appearance: She is well-developed.  HENT:     Head: Normocephalic and atraumatic.  Cardiovascular:     Rate and Rhythm: Normal rate and regular rhythm.  Pulmonary:     Effort: No respiratory distress.     Breath sounds: No stridor.  Abdominal:     General: There is no distension.  Musculoskeletal:     Cervical back: Normal range of motion.  Neurological:     Mental Status: She is alert.     ED Results / Procedures / Treatments   Labs (all labs ordered are listed, but only abnormal results are displayed) Labs Reviewed  COMPREHENSIVE METABOLIC PANEL - Abnormal; Notable for the following components:      Result Value   Glucose, Bld 104 (*)    All other components within normal limits  CBC - Abnormal; Notable for the following components:   RBC 3.74 (*)    Hemoglobin 11.5 (*)    HCT 34.6 (*)    All other components within normal limits  URINALYSIS, ROUTINE W REFLEX MICROSCOPIC - Abnormal; Notable for  the following components:   APPearance HAZY (*)    Leukocytes,Ua LARGE (*)    Bacteria, UA FEW (*)    All other components within normal limits  LIPASE, BLOOD    EKG None  Radiology No results found.  Procedures Procedures    Medications Ordered in ED Medications  HYDROmorphone (DILAUDID) injection 0.5 mg (0.5 mg Intravenous Given 07/05/23 0709)  lactated ringers bolus 1,000 mL (0 mLs Intravenous Stopped 07/05/23 1000)  ondansetron (ZOFRAN) injection 4 mg (4 mg Intravenous Given 07/05/23 0738)  iopamidol (ISOVUE-370) 76 % injection 75 mL (75 mLs Intravenous Contrast Given 07/05/23 0736)  HYDROmorphone (DILAUDID) tablet 1 mg (1 mg Oral Given 07/05/23 1003)    ED Course/ Medical Decision Making/ A&P Clinical Course as of 07/19/23 0032  Wed Jul 05, 2023  0701 Stable 61 YOF with colectomy/diversion. Complicated by infection. Has recurrent UTIS from prolonged hospitalizations. On Augmentin. Left sided AP. CTAP pending. Recent MRI for 6mm CBD  [CC]  0906 CT scan positive for acute left upper quadrant gastroenteritis. Will continue with supportive care.  No evidence of severe electrolyte abnormality nor critical infection on my exam.  Will treat with supportive care plan for follow-up with PCP in outpatient setting. [CC]    Clinical Course User Index [CC] Glyn Ade, MD                                 Medical Decision Making Amount and/or Complexity of Data Reviewed Labs: ordered. Radiology: ordered.  Risk Prescription drug management.   CT ordered to eval for pyelo, stone, obstruction or other complications. Labs reassuring. Care transferred pending CT and reevaluation for disposition.  Final Clinical Impression(s) / ED Diagnoses Final diagnoses:  Left upper quadrant abdominal pain    Rx / DC Orders ED Discharge Orders          Ordered    ondansetron (ZOFRAN) 4 MG tablet  Every 6 hours PRN        07/05/23 0955              Shiquan Mathieu, Barbara Cower,  MD 07/19/23 1610
